# Patient Record
Sex: Male | Born: 1940 | Race: Asian | Hispanic: No | Smoking: Never smoker
Health system: Southern US, Community
[De-identification: ages and names within clinical notes are randomized; demographics above are authoritative.]

## PROBLEM LIST (undated history)

## (undated) DIAGNOSIS — I639 Cerebral infarction, unspecified: Secondary | ICD-10-CM

## (undated) DIAGNOSIS — R42 Dizziness and giddiness: Secondary | ICD-10-CM

## (undated) DIAGNOSIS — E119 Type 2 diabetes mellitus without complications: Secondary | ICD-10-CM

## (undated) DIAGNOSIS — I10 Essential (primary) hypertension: Secondary | ICD-10-CM

## (undated) HISTORY — DX: Cerebral infarction, unspecified: I63.9

---

## 2010-04-20 ENCOUNTER — Inpatient Hospital Stay (HOSPITAL_COMMUNITY): Admission: EM | Admit: 2010-04-20 | Discharge: 2010-04-23 | Payer: Self-pay | Admitting: Emergency Medicine

## 2010-04-20 ENCOUNTER — Emergency Department (HOSPITAL_COMMUNITY): Admission: EM | Admit: 2010-04-20 | Discharge: 2010-04-20 | Payer: Self-pay | Admitting: Emergency Medicine

## 2010-04-20 ENCOUNTER — Encounter (INDEPENDENT_AMBULATORY_CARE_PROVIDER_SITE_OTHER): Payer: Self-pay | Admitting: *Deleted

## 2010-04-20 DIAGNOSIS — D72829 Elevated white blood cell count, unspecified: Secondary | ICD-10-CM | POA: Insufficient documentation

## 2010-04-20 DIAGNOSIS — Z8719 Personal history of other diseases of the digestive system: Secondary | ICD-10-CM

## 2010-04-21 ENCOUNTER — Ambulatory Visit: Payer: Self-pay | Admitting: Internal Medicine

## 2010-04-21 ENCOUNTER — Encounter (INDEPENDENT_AMBULATORY_CARE_PROVIDER_SITE_OTHER): Payer: Self-pay | Admitting: *Deleted

## 2010-05-02 ENCOUNTER — Ambulatory Visit: Payer: Self-pay | Admitting: Nurse Practitioner

## 2010-05-02 DIAGNOSIS — I10 Essential (primary) hypertension: Secondary | ICD-10-CM

## 2010-05-10 ENCOUNTER — Encounter (INDEPENDENT_AMBULATORY_CARE_PROVIDER_SITE_OTHER): Payer: Self-pay | Admitting: *Deleted

## 2010-05-14 ENCOUNTER — Emergency Department (HOSPITAL_COMMUNITY): Admission: EM | Admit: 2010-05-14 | Discharge: 2010-05-14 | Payer: Self-pay | Admitting: Emergency Medicine

## 2010-05-16 ENCOUNTER — Ambulatory Visit: Payer: Self-pay | Admitting: Nurse Practitioner

## 2010-05-16 LAB — CONVERTED CEMR LAB
BUN: 6 mg/dL (ref 6–23)
CO2: 24 meq/L (ref 19–32)
Calcium: 9.5 mg/dL (ref 8.4–10.5)
Chloride: 104 meq/L (ref 96–112)
Glucose, Bld: 92 mg/dL (ref 70–99)
Sodium: 140 meq/L (ref 135–145)

## 2010-06-27 ENCOUNTER — Ambulatory Visit: Payer: Self-pay | Admitting: Nurse Practitioner

## 2010-06-29 ENCOUNTER — Ambulatory Visit: Payer: Self-pay | Admitting: Internal Medicine

## 2010-06-29 DIAGNOSIS — K5289 Other specified noninfective gastroenteritis and colitis: Secondary | ICD-10-CM | POA: Insufficient documentation

## 2010-06-29 DIAGNOSIS — R1031 Right lower quadrant pain: Secondary | ICD-10-CM | POA: Insufficient documentation

## 2010-06-29 DIAGNOSIS — R933 Abnormal findings on diagnostic imaging of other parts of digestive tract: Secondary | ICD-10-CM | POA: Insufficient documentation

## 2010-08-28 ENCOUNTER — Telehealth (INDEPENDENT_AMBULATORY_CARE_PROVIDER_SITE_OTHER): Payer: Self-pay | Admitting: Nurse Practitioner

## 2010-08-28 ENCOUNTER — Ambulatory Visit: Payer: Self-pay | Admitting: Nurse Practitioner

## 2010-08-28 DIAGNOSIS — N433 Hydrocele, unspecified: Secondary | ICD-10-CM

## 2010-08-28 DIAGNOSIS — R351 Nocturia: Secondary | ICD-10-CM

## 2010-08-28 LAB — CONVERTED CEMR LAB
Alkaline Phosphatase: 62 units/L (ref 39–117)
Basophils Absolute: 0 10*3/uL (ref 0.0–0.1)
Bilirubin Urine: NEGATIVE
Blood in Urine, dipstick: NEGATIVE
CO2: 28 meq/L (ref 19–32)
Calcium: 9.2 mg/dL (ref 8.4–10.5)
Eosinophils Absolute: 0.1 10*3/uL (ref 0.0–0.7)
Glucose, Urine, Semiquant: NEGATIVE
HCT: 44.2 % (ref 39.0–52.0)
Ketones, urine, test strip: NEGATIVE
Lymphs Abs: 1.6 10*3/uL (ref 0.7–4.0)
MCHC: 35.1 g/dL (ref 30.0–36.0)
Microalb, Ur: 0.5 mg/dL (ref 0.00–1.89)
Nitrite: NEGATIVE
Potassium: 3.9 meq/L (ref 3.5–5.3)
RBC: 4.86 M/uL (ref 4.22–5.81)
RDW: 13.9 % (ref 11.5–15.5)
Sodium: 140 meq/L (ref 135–145)
Specific Gravity, Urine: 1.015
WBC Urine, dipstick: NEGATIVE

## 2010-08-29 ENCOUNTER — Encounter (INDEPENDENT_AMBULATORY_CARE_PROVIDER_SITE_OTHER): Payer: Self-pay | Admitting: Nurse Practitioner

## 2010-08-30 ENCOUNTER — Encounter (INDEPENDENT_AMBULATORY_CARE_PROVIDER_SITE_OTHER): Payer: Self-pay | Admitting: *Deleted

## 2010-09-05 ENCOUNTER — Encounter (INDEPENDENT_AMBULATORY_CARE_PROVIDER_SITE_OTHER): Payer: Self-pay | Admitting: Nurse Practitioner

## 2010-09-05 ENCOUNTER — Ambulatory Visit: Payer: Self-pay | Admitting: Nurse Practitioner

## 2010-09-05 LAB — CONVERTED CEMR LAB
Cholesterol: 200 mg/dL (ref 0–200)
HDL: 51 mg/dL (ref >39)
LDL Cholesterol: 125 mg/dL — ABNORMAL HIGH (ref 0–99)
Total CHOL/HDL Ratio: 3.9
Triglycerides: 119 mg/dL (ref <150)
VLDL: 24 mg/dL (ref 0–40)

## 2010-09-06 ENCOUNTER — Encounter (INDEPENDENT_AMBULATORY_CARE_PROVIDER_SITE_OTHER): Payer: Self-pay | Admitting: Nurse Practitioner

## 2010-09-19 ENCOUNTER — Ambulatory Visit: Payer: Self-pay | Admitting: Internal Medicine

## 2010-09-19 ENCOUNTER — Encounter (INDEPENDENT_AMBULATORY_CARE_PROVIDER_SITE_OTHER): Payer: Self-pay

## 2010-10-02 ENCOUNTER — Encounter: Payer: Self-pay | Admitting: Internal Medicine

## 2010-10-02 ENCOUNTER — Ambulatory Visit
Admission: RE | Admit: 2010-10-02 | Discharge: 2010-10-02 | Payer: Self-pay | Source: Home / Self Care | Attending: Internal Medicine | Admitting: Internal Medicine

## 2010-10-06 ENCOUNTER — Encounter: Payer: Self-pay | Admitting: Internal Medicine

## 2010-10-24 NOTE — Assessment & Plan Note (Signed)
Summary: Recheck BP, BMET   Nurse Visit   Vital Signs:  Patient profile:   70 year old male Pulse rate:   72 / minute Pulse rhythm:   regular BP sitting:   138 / 82  (right arm)  Patient Instructions: 1)  Blood pressure is better, almost at goal blood pressure of less than 135/80. 2)  Per Jesse Fall, continue taking current dose of Lisinopril, no change at this time. 3)  Follow-up with Jesse Fall at next scheduled appt. 4)  Call if anything changes before then.   CC:  BP check and BMET.  History of Present Illness: Here for BP check and BMET.  Initial visit for hospital f/u on 05/02/10.  Has taken meds this morning.  Started Lisinopril at last visit, denies any side effects. BP goal is < 135/80.   Physical Exam  General:  alert, well-developed, and well-hydrated.     Review of Systems  The patient denies chest pain, syncope, peripheral edema, prolonged cough, and headaches.         Also denies any visual changes or dizziness.  CC: BP check and BMET  Does patient need assistance? Functional Status Self care Ambulation Normal   Allergies: No Known Drug Allergies  Orders Added: 1)  Est. Patient Level I [04540]  Appended Document: Orders Update    Clinical Lists Changes  Orders: Added new Test order of T-Basic Metabolic Panel [id number]) - Signed

## 2010-10-24 NOTE — Assessment & Plan Note (Signed)
Summary: Complete Physical Exam   Vital Signs:  Patient profile:   70 year old male Weight:      155.9 pounds BMI:     24.51 Temp:     97.2 degrees F oral Pulse rate:   70 / minute Pulse rhythm:   regular Resp:     16 per minute BP sitting:   130 / 90  (left arm)  Vitals Entered By: Levon Hedger (August 28, 2010 10:02 AM) CC: CPE, Hypertension Management Is Patient Diabetic? No Pain Assessment Patient in pain? no       Does patient need assistance? Functional Status Self care Ambulation Normal   Primary Care Provider:  Maralyn Sago, FNP  CC:  CPE and Hypertension Management.  History of Present Illness:  Pt into the office for a complete physical exam Presents today with Nephew who is his interpreter [employer]  Colonscopy - discussed with pt by GI about colonscopy Pt was to review the information and decide if he wants to have it done Pt and nephew have discussed and decided to have the screening test.  Dental - 1 month ago was last dental appt for routine cleaning  Optho - No eye exam  Hypertension History:      He denies headache, chest pain, and palpitations.  He notes no problems with any antihypertensive medication side effects.  Pt presents today with blood pressure medications which he takes daily.        Positive major cardiovascular risk factors include male age 69 years old or older and hypertension.  Negative major cardiovascular risk factors include non-tobacco-user status.     Habits & Providers  Alcohol-Tobacco-Diet     Alcohol drinks/day: 2     Alcohol Counseling: to decrease amount and/or frequency of alcohol intake     Alcohol type: beer     Tobacco Status: quit     Tobacco Counseling: to remain off tobacco products     Year Quit: quit 02/2010  Exercise-Depression-Behavior     Does Patient Exercise: no     Have you felt down or hopeless? no     Have you felt little pleasure in things? no     Depression Counseling: not  indicated; screening negative for depression     Drug Use: never  Comments: PHQ- 9 not done due to language barrier  Medications Prior to Update: 1)  Lisinopril 20 Mg Tabs (Lisinopril) .... One Tablet By Mouth Daily For Blood Pressure **note Change in Dose**  Current Medications (verified): 1)  Lisinopril 20 Mg Tabs (Lisinopril) .... One Tablet By Mouth Daily For Blood Pressure **note Change in Dose**  Allergies (verified): No Known Drug Allergies  Review of Systems General:  Denies fever. Eyes:  Denies blurring. ENT:  Denies earache. CV:  Denies chest pain or discomfort. Resp:  Denies cough. GI:  Denies abdominal pain, constipation, nausea, and vomiting; Daily BM in morning, no straining. GU:  Denies urinary frequency. MS:  Denies joint pain; +dizziness at times. usually short lived and is positional. Derm:  Denies dryness. Neuro:  Denies headaches. Psych:  Denies depression.  Physical Exam  General:  alert.   Head:  normocephalic and male-pattern balding.   Eyes:  pupils equal, pupils round, and pupils reactive to light.   Ears:  bil TM with bony landmarks present Nose:  no nasal discharge.   Mouth:  pharynx pink and moist and poor dentition.   Neck:  supple.   Chest Wall:  no deformities.  Breasts:  no gynecomastia.   Lungs:  normal breath sounds.   Heart:  normal rate and regular rhythm.   Abdomen:  soft, non-tender, and normal bowel sounds.   Rectal:  no external abnormalities and external hemorrhoid(s).   Genitalia:  uncircumcised.  right testicular enlargment Prostate:  1+ enlarged.  nontender Msk:  normal ROM.   Extremities:  no edema Neurologic:  alert & oriented X3.   Skin:  color normal.   Psych:  Oriented X3.     Impression & Recommendations:  Problem # 1:  HYPERTENSION, BENIGN ESSENTIAL (ICD-401.1) BP stable continue current meds His updated medication list for this problem includes:    Lisinopril 20 Mg Tabs (Lisinopril) ..... One tablet by  mouth daily for blood pressure **note change in dose**  Orders: EKG w/ Interpretation (93000) T-CBC w/Diff (16109-60454) T-Comprehensive Metabolic Panel (09811-91478) T-Urine Microalbumin w/creat. ratio [phone])  Problem # 2:  NOCTURIA (ICD-788.43) u/a ok today. 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 One tablet by mouth daily for blood pressure **note change in dose**  Hypertension Assessment/Plan:      The patient's hypertensive risk group is category B: At least one risk factor (excluding diabetes) with no target organ damage.  Today's blood pressure is 130/90.  His blood pressure goal is < 140/90.  Patient Instructions: 1)  Schedule an appointment for fasting labs - lipids (401.1) 2)  You will be schedule for colonscopy. You will be notified the time/date of the appointment. 3)  Testicular - right side enlargment.  Since this has been with you since childhood then will schedule ultrasound at next visit 4)  Follow up in 2 months with n.martin,fnp for high blood pressure   Orders Added: 1)  Colonoscopy [Colon] 2)  Est. Patient Level IV [10272] 3)  Hemoccult Guaiac-1 spec.(in office) [82270] 4)  UA Dipstick w/o Micro (manual) [81002] 5)  EKG w/ Interpretation [93000] 6)  T-CBC w/Diff  [53664-40347] 7)  T-Comprehensive Metabolic Panel [80053-22900] 8)  T-PSA [42595-63875] 9)  T-Urine Microalbumin w/creat. ratio [[id number]]    Laboratory Results   Urine Tests  Date/Time Received: August 28, 2010 10:27 AM   Routine Urinalysis   Color: lt. yellow Appearance: Clear Glucose: negative   (Normal Range: Negative) Bilirubin: negative   (Normal Range: Negative) Ketone: negative   (Normal Range: Negative) Spec. Gravity: 1.015   (Normal Range: 1.003-1.035) Blood: negative   (Normal Range: Negative) pH: 7.0   (Normal Range: 5.0-8.0) Protein: negative   (Normal Range: Negative) Urobilinogen: 0.2   (Normal Range: 0-1) Nitrite: negative   (Normal Range: Negative) Leukocyte Esterace: negative   (Normal Range: Negative)        Prevention & Chronic Care Immunizations   Influenza vaccine: Fluvax 3+  (06/27/2010)    Tetanus booster: 12/06/2009: Historical    Pneumococcal vaccine: Historical  (05/10/2010)    H. zoster  vaccine: Not documented  Colorectal Screening   Hemoccult: Not documented   Hemoccult action/deferral: Ordered  (08/28/2010)   Hemoccult due: 08/29/2011    Colonoscopy: Not documented   Colonoscopy action/deferral: GI referral  (08/28/2010)  Other Screening   PSA: Not documented   PSA ordered.   PSA action/deferral: Discussed-PSA requested  (08/28/2010)   PSA due due: 08/29/2011   Smoking status: quit  (08/28/2010)  Lipids   Total Cholesterol: Not documented   LDL: Not documented   LDL Direct: Not documented   HDL: Not documented   Triglycerides: Not documented  Hypertension   Last Blood Pressure: 130 / 90  (08/28/2010)   Serum creatinine: 0.73  (05/16/2010)   Serum potassium 4.6  (05/16/2010) CMP ordered   Self-Management Support :    Hypertension self-management support: Not documented    EKG  Procedure date:  08/28/2010  Findings:      NSR  Appended Document: Complete Physical Exam    Clinical Lists  Changes  Observations: Added new observation of HEMOCCU 1 DT: 08/28/2010 (08/28/2010 11:13) Added new observation of HEMOCCULT 1: negative (08/28/2010 11:13)        Laboratory Results  Date/Time Received: August 28, 2010 11:14 AM   Stool - Occult Blood Hemmoccult #1: negative Date: 08/28/2010

## 2010-10-24 NOTE — Assessment & Plan Note (Signed)
Summary: NEW - Hospital F/U   Vital Signs:  Patient profile:   70 year old male Height:      67 inches Weight:      144.9 pounds BMI:     22.78 BSA:     1.76 Temp:     97.8 degrees F oral Pulse rate:   79 / minute Pulse rhythm:   regular Resp:     20 per minute BP sitting:   148 / 88  (left arm) Cuff size:   regular  Vitals Entered By: Levon Hedger (May 02, 2010 9:55 AM) CC: follow-up visit hospital visit [place]...Marland Kitchenneeds medication for HTN, Hypertension Management Is Patient Diabetic? No Pain Assessment Patient in pain? no       Does patient need assistance? Functional Status Self care Ambulation Normal   CC:  follow-up visit hospital visit [place]...Marland Kitchenneeds medication for HTN and Hypertension Management.  History of Present Illness:  Pt into the office to establish care.  No previous PCP  Pt recently relocated from [place] 2 months ago.  Hospitalized from 04/20/2010 to 04/23/2010 (full hospital d/c reviewed)  1. Colitis - presumed infectious.  General surgery consulted due to ? appendicitis.  Pt was started on IV antibiotics cipro and metronidazole.  Surgery recommended to monitor and consider consulting GI because infection appeared to involve the appendix - extension into the cecal colitis. Stool cultures negative apart from fecal lactoferrin, which is positive.   C. Diff negative. Plan:  continue antibioitice for total of 7 days and f/u with GI as outpt for colonscopy in 4 weeks - Dr. Marina Goodell.   2.  HTN - questionable dx.  Pt was taken BP in his country prior to moving to the [place].  He completed his supply so was not able to continue meds.  3.  Leukocytosis - probably part of infectious colitis.  WBC 21.5 on admission and decreased to 6.8 after initiation of antibiotics.    presents today with nephew who is interpreting for pt  Hypertension History:      previous Dx but no meds at this time.        Positive major cardiovascular risk factors include male  age 52 years old or older and hypertension.  Negative major cardiovascular risk factors include non-tobacco-user status.     Habits & Providers  Alcohol-Tobacco-Diet     Alcohol drinks/day: 0     Alcohol Counseling: quit 02/2010     Tobacco Status: quit     Tobacco Counseling: to remain off tobacco products     Year Quit: quit 02/2010  Exercise-Depression-Behavior     Drug Use: never  Medications Prior to Update: 1)  None  Allergies (verified): No Known Drug Allergies  Social History: Orginally from [place] To [place] in 02/2010 Tobacco - quit 02/2010 ETOH - quit 02/2010 Drug - noneSmoking Status:  quit Drug Use:  never  Review of Systems CV:  Denies chest pain or discomfort. Resp:  Denies cough. GI:  Denies abdominal pain, diarrhea, nausea, and vomiting.  Physical Exam  General:  alert.   Head:  normocephalic.   Lungs:  normal breath sounds.   Heart:  normal rate and regular rhythm.   Abdomen:  normal bowel sounds.   Msk:  normal ROM.   Neurologic:  alert & oriented X3.   Psych:  Oriented X3.     Impression & Recommendations:  Problem # 1:  HYPERTENSION, BENIGN ESSENTIAL (ICD-401.1) will start on low dose ACE His updated medication list  for this problem includes:    Lisinopril 10 Mg Tabs (Lisinopril) ..... One tablet by mouth daily for blood pressure  Problem # 2:  Hx of LEUKOCYTOSIS (ICD-288.60) resolved prior to hospital d/c  Problem # 3:  COLITIS, HX OF (ICD-V12.79) pt to schedule f/u appt with GI he has finished antibiotics as ordered  Complete Medication List: 1)  Lisinopril 10 Mg Tabs (Lisinopril) .... One tablet by mouth daily for blood pressure  Hypertension Assessment/Plan:      The patient's hypertensive risk group is category B: At least one risk factor (excluding diabetes) with no target organ damage.  Today's blood pressure is 148/88.  His blood pressure goal is < 140/90.  Patient Instructions: 1)  Call [facility] and schedule an appointment with  Dr. Marina Goodell. 2)  Blood pressure - slightly elevated today.  Will start lisinopril 10mg  by mouth daily for blood pressure 3)  Follow up in 2 weeks with Triage Nurse for blood pressure check.  Goal < 135/80 4)  Take blood pressure medications before this appointment. 5)  will also need bmet 6)  Follow up with n.martin,fnp in 8 weeks for blood pressure and stomach. Prescriptions: LISINOPRIL 10 MG TABS (LISINOPRIL) One tablet by mouth daily for blood pressure  #30 x 1   Entered and Authorized by:   Lehman Prom FNP   Signed by:   Lehman Prom FNP on 05/02/2010   Method used:   Print then Give to Patient   RxID:   [id number]    CT of Abdomen  Procedure date:  04/20/2010  Findings:      significant cecal wall thckening, most likely infectious or inflammatory colitis.  there is also thickened enlargment of the appendix, slight colitis.  There is also thickened enlargment of the appendis, slight stranding noting around both the cecum and appartently the appendix.  Finding concerning for associated appendicitis   CT of Abdomen  Procedure date:  04/20/2010  Findings:      significant cecal wall thckening, most likely infectious or inflammatory colitis.  there is also thickened enlargment of the appendix, slight colitis.  There is also thickened enlargment of the appendis, slight stranding noting around both the cecum and appartently the appendix.  Finding concerning for associated appendicitis

## 2010-10-24 NOTE — Letter (Signed)
Summary: *HSN Results Follow up  [facility]  [address] [place], [place] [address]   Phone: [phone]  Fax: [phone]      08/29/2010   [name] [address] [place], [place]  [address]   Dear  [name],                            ____S.Drinkard,FNP   ____D. Gore,FNP       ____B. McPherson,MD   ____V. Rankins,MD    ____E. Mulberry,MD    __X__N. Daphine Deutscher, FNP  ____D. Reche Dixon, MD    ____K. Philipp Deputy, MD    ____Other     This letter is to inform you that your recent test(s):  _______Pap Smear    ___X____Lab Test     _______X-ray    ___X____ is within acceptable limits  _______ requires a medication change  _______ requires a follow-up lab visit  _______ requires a follow-up visit with your provider   Comments: Labs done during recent office visit are normal.       _________________________________________________________ If you have any questions, please contact our office [phone].                    Sincerely,    Lehman Prom FNP [facility]

## 2010-10-24 NOTE — Assessment & Plan Note (Signed)
Summary: Hospital follow (new patient)   History of Present Illness Visit Type: Initial Consult Primary GI MD: Yancey Flemings MD Primary Mailani Degroote: Maralyn Sago, FNP Chief Complaint: Patient c/o RLQ abdominal pain which occurred for several days. It has since subsided. No other GI symptoms. History of Present Illness:   Pleasant non-English-speaking 70 year old native of [place] who presents today for post hospitalization follow up. He is accompanied by his son, who provides translation. The patient was seen as a hospital inpatient after admission on April 20, 2010 for acute right lower quadrant abdominal pain. Upon presentation, there was leukocytosis. CT Scan of the abdomen and pelvis revealed thickening and inflammation in the region of the cecum. Some initial concern for appendicitis for which he was seen by surgery. Subsequently felt to have an acute infectious colitis, unspecified. He was discharged home within 3 days and at bedtime feeling well. Since his discharge home he has had no problems. No recurrent abdominal pain. No problems with his bowels. No bleeding. Good appetite. No weight loss. He is seen at [facility].   GI Review of Systems     Location of  Abdominal pain: RLQ.    Denies abdominal pain, acid reflux, belching, bloating, chest pain, dysphagia with liquids, dysphagia with solids, heartburn, loss of appetite, nausea, vomiting, vomiting blood, weight loss, and  weight gain.         Current Medications (verified): 1)  Lisinopril 20 Mg Tabs (Lisinopril) .... One Tablet By Mouth Daily For Blood Pressure **note Change in Dose**  Allergies (verified): No Known Drug Allergies  Past History:  Past Medical History: Reviewed history from 06/28/2010 and no changes required. Colitis Hypertension  Past Surgical History: Reviewed history from 06/28/2010 and no changes required. Unremarkable  Family History: No FH of Colon Cancer:  Social History: Reviewed history  from 05/02/2010 and no changes required. Orginally from [place] To [place] in 02/2010 Tobacco - quit 02/2010 ETOH - quit 02/2010 Drug - none  Review of Systems       The patient complains of headaches-new.  The patient denies allergy/sinus, anemia, anxiety-new, arthritis/joint pain, back pain, blood in urine, breast changes/lumps, change in vision, confusion, cough, coughing up blood, depression-new, fainting, fatigue, fever, hearing problems, heart murmur, heart rhythm changes, itching, menstrual pain, muscle pains/cramps, night sweats, nosebleeds, pregnancy symptoms, shortness of breath, skin rash, sleeping problems, sore throat, swelling of feet/legs, swollen lymph glands, thirst - excessive , urination - excessive , urination changes/pain, urine leakage, vision changes, and voice change.    Vital Signs:  Patient profile:   70 year old male Height:      67 inches Weight:      151 pounds BMI:     23.74 BSA:     1.80 Pulse rate:   88 / minute Pulse rhythm:   regular BP sitting:   154 / 98  (left arm)  Vitals Entered By: Lamona Curl CMA Duncan Dull) (June 29, 2010 10:26 AM)  Physical Exam  General:  Well developed, well nourished, no acute distress. Head:  Normocephalic and atraumatic. Eyes:  PERRLA, no icterus. Mouth:  No deformity or lesions, Neck:  Supple; no masses or thyromegaly. Lungs:  Clear throughout to auscultation. Heart:  Regular rate and rhythm; no murmurs, rubs,  or bruits. Abdomen:  Soft, nontender and nondistended. No masses, hepatosplenomegaly or hernias noted. Normal bowel sounds. Msk:  no deformities Pulses:  Normal pulses noted. Extremities:  no edema Neurologic:  alert oriented Skin:  no rash Psych:  Alert  and cooperative. Normal mood and affect.   Impression & Recommendations:  Problem # 1:  OTH&UNSPEC NONINFECTIOUS GASTROENTERITIS&COLITIS (ICD-64.82) 70 year old who presented to the hospital 2 months ago with acute right lower quadrant pain and a CT scan  demonstrating inflammation of the proximal right colon. Clinical picture most consistent with acute colitis, likely infectious. Prompt resolution with supportive care. No recurrence. Currently asymptomatic. I discussed this with the patient, through son, and his son. At this point, expectant management for this condition. We also discussed the prospect of colonoscopy for colon cancer screening. See below  Problem # 2:  SCREENING COLORECTAL-CANCER (ICD-V76.51) appropriate candidate without contraindication. Next the procedure as well as risks benefits and alternatives were discussed with the patient's son and the patient through the son. Not clear if they want to proceed. Thus, literature on colon polyps, colon cancer, and colonoscopy [place] Kynard for their review. We would be happy to set up outpatient screening colonoscopy if desired. Otherwise, but will return to the care of her primary Mushka Laconte.  Problem # 3:  ABDOMINAL PAIN-RLQ (ICD-789.03) please see  #1 above  Problem # 4:  ABNORMAL FINDINGS GI TRACT (ICD-793.4) please see #1 above  Patient Instructions: 1)  Pt. to read colonoscopy and colon polyps/cancer brochure and decide if wants to schedule.   2)  Please schedule a follow-up appointment as needed.  3)  Copy sent to : Maralyn Sago, FNP 4)  The medication list was reviewed and reconciled.  All changed / newly prescribed medications were explained.  A complete medication list was provided to the patient / caregiver.

## 2010-10-24 NOTE — Letter (Signed)
Summary: New Patient letter  [facility]  [address] [place], [place] [address]   Phone: [phone]  Fax: [phone]       05/10/2010 MRN: [id number]  [facility] [address] [place], [place]  [address]  Dear [name],  Welcome to the Gastroenterology Division at [facility].    You are scheduled to see Dr.  Marina Goodell on 06-29-10 at 10:00a.m. on the 3rd floor at [facility], [address]  We ask that you try to arrive at our office 15 minutes prior to your appointment time to allow for check-in.  We would like you to complete the enclosed self-administered evaluation form prior to your visit and bring it with you on the day of your appointment.  We will review it with you.  Also, please bring a complete list of all your medications or, if you prefer, bring the medication bottles and we will list them.  Please bring your insurance card so that we may make a copy of it.  If your insurance requires a referral to see a specialist, please bring your referral form from your primary care physician.  Co-payments are due at the time of your visit and may be paid by cash, check or credit card.     Your office visit will consist of a consult with your physician (includes a physical exam), any laboratory testing he/she may order, scheduling of any necessary diagnostic testing (e.g. x-ray, ultrasound, CT-scan), and scheduling of a procedure (e.g. Endoscopy, Colonoscopy) if required.  Please allow enough time on your schedule to allow for any/all of these possibilities.    If you cannot keep your appointment, please call [phone] to cancel or reschedule prior to your appointment date.  This allows [place] the opportunity to schedule an appointment for another patient in need of care.  If you do not cancel or reschedule by 5 p.m. the business day prior to your appointment date, you will be charged a $50.00 late cancellation/no-show fee.    Thank you for choosing  [facility] for your medical needs.  We appreciate the opportunity to care for you.  Please visit [place] at our website  to learn more about our practice.                     Sincerely,                                                             The Gastroenterology Division

## 2010-10-24 NOTE — Assessment & Plan Note (Signed)
Summary: HTN   Vital Signs:  Patient profile:   70 year old male Weight:      150.4 pounds Temp:     97.1 degrees F oral Pulse rate:   80 / minute Pulse rhythm:   regular Resp:     10 per minute BP sitting:   140 / 90  (left arm) Cuff size:   regular  Vitals Entered By: Levon Hedger (June 27, 2010 9:06 AM) CC: follow-up visit, Hypertension Management Is Patient Diabetic? No Pain Assessment Patient in pain? no       Does patient need assistance? Functional Status Self care Ambulation Normal   CC:  follow-up visit and Hypertension Management.  History of Present Illness:  Pt into the office for f/u on htn. Pt has last seen in this office 2 months ago.   Colitis - pt has f/u appt with [facility] on October 6th. No current complaints with stomach Pt took all antibiotics as ordered -diarrhea -abd pain -fever  Nephew here today to interpret for pt.  No acute problems today, only here for scheduled f/u appt.  Hypertension History:      He denies headache, chest pain, and palpitations.  He notes no problems with any antihypertensive medication side effects.  Pt did not bring his medications into the office but has a print out of his last office visit and acknowledges that he takes the medications daily.  .  Further comments include: He takes medications at 8AM daily.        Positive major cardiovascular risk factors include male age 42 years old or older and hypertension.  Negative major cardiovascular risk factors include non-tobacco-user status.     Habits & Providers  Alcohol-Tobacco-Diet     Alcohol drinks/day: 0     Alcohol Counseling: quit 02/2010     Tobacco Status: quit     Tobacco Counseling: to remain off tobacco products     Year Quit: quit 02/2010  Exercise-Depression-Behavior     Does Patient Exercise: no     Have you felt down or hopeless? no     Have you felt little pleasure in things? no     Depression Counseling: not indicated; screening  negative for depression     Drug Use: never  Current Medications (verified): 1)  Lisinopril 20 Mg Tabs (Lisinopril) .... One Tablet By Mouth Daily For Blood Pressure **note Change in Dose**  Allergies (verified): No Known Drug Allergies  Social History: Does Patient Exercise:  no  Review of Systems General:  Denies fever. CV:  Denies chest pain or discomfort. Resp:  Denies cough. GI:  Denies abdominal pain, diarrhea, nausea, and vomiting.  Physical Exam  General:  alert.   Head:  normocephalic.   Lungs:  normal breath sounds.   Heart:  normal rate and regular rhythm.   Abdomen:  normal bowel sounds.   Msk:  normal ROM.   Neurologic:  alert & oriented X3.   Skin:  color normal.   Psych:  Oriented X3.     Impression & Recommendations:  Problem # 1:  HYPERTENSION, BENIGN ESSENTIAL (ICD-401.1) still elevated will increase meds  DASH diet reviewed His updated medication list for this problem includes:    Lisinopril 20 Mg Tabs (Lisinopril) ..... One tablet by mouth daily for blood pressure **note change in dose**  Problem # 2:  NEED PROPHYLACTIC VACCINATION&INOCULATION FLU (ICD-V04.81) flu vaccine given today  Complete Medication List: 1)  Lisinopril 20 Mg Tabs (Lisinopril) .... One  tablet by mouth daily for blood pressure **note change in dose**  Other Orders: Flu Vaccine 44yrs + (52841) Admin 1st Vaccine (32440)  Hypertension Assessment/Plan:      The patient's hypertensive risk group is category B: At least one risk factor (excluding diabetes) with no target organ damage.  Today's blood pressure is 140/90.  His blood pressure goal is < 140/90.  Patient Instructions: 1)  Blood pressure - still a little elevated 2)  will increase lisinopril to 20mg  by mouth daily 3)  You have been given the flu vaccine today. 4)  Keep your follow up appointment with [facility] this week 5)  Schedule appointment for a complete physical exam here in 2 months with n.martin,fnp. 6)   Do not eat after midnight before this appointment but take your blood pressure medications with water. 7)  Will need lipids, cbc, psa, cmp, u/a, microalbumin.  Will also need rectal and prostate exam, EKG. Prescriptions: LISINOPRIL 20 MG TABS (LISINOPRIL) One tablet by mouth daily for blood pressure **note change in dose**  #30 x 5   Entered and Authorized by:   Lehman Prom FNP   Signed by:   Lehman Prom FNP on 06/27/2010   Method used:   Print then Give to Patient   RxID:   [id number]   Immunization History:  Tetanus/Td Immunization History:    Tetanus/Td:  historical (12/06/2009)  Pneumovax Immunization History:    Pneumovax:  historical (05/10/2010)  Immunizations Administered:  Influenza Vaccine # 1:    Vaccine Type: Fluvax 3+    Site: left deltoid    Mfr: GlaxoSmithKline    Dose: 0.5 ml    Route: IM    Given by: Levon Hedger    Exp. Date: 02/2011    Lot #: [id number]    VIS given: 04/18/10 version given June 27, 2010.  Flu Vaccine Consent Questions:    Do you have a history of severe allergic reactions to this vaccine? no    Any prior history of allergic reactions to egg and/or gelatin? no    Do you have a sensitivity to the preservative Thimersol? no    Do you have a past history of Guillan-Barre Syndrome? no    Do you currently have an acute febrile illness? no    Have you ever had a severe reaction to latex? no    Vaccine information given and explained to patient? yes ndc [phone]  Prevention & Chronic Care Immunizations   Influenza vaccine: Fluvax 3+  (06/27/2010)    Tetanus booster: 12/06/2009: Historical    Pneumococcal vaccine: Historical  (05/10/2010)    H. zoster vaccine: Not documented  Colorectal Screening   Hemoccult: Not documented    Colonoscopy: Not documented  Other Screening   PSA: Not documented   Smoking status: quit  (06/27/2010)  Lipids   Total Cholesterol: Not documented   LDL: Not documented   LDL  Direct: Not documented   HDL: Not documented   Triglycerides: Not documented  Hypertension   Last Blood Pressure: 140 / 90  (06/27/2010)   Serum creatinine: 0.73  (05/16/2010)   Serum potassium 4.6  (05/16/2010)  Self-Management Support :    Hypertension self-management support: Not documented   Nursing Instructions: Give Flu vaccine today

## 2010-10-24 NOTE — Initial Assessments (Signed)
Summary: Consultation    [name], [name]                ACCOUNT NO.:  [id number]      MEDICAL RECORD NO.:  [id number]          PATIENT TYPE:  INP      LOCATION:  5036                         FACILITY:  [facility]      PHYSICIAN:  Gabrielle Dare. Janee Morn, M.D.DATE OF BIRTH:  10-08-1940      DATE OF CONSULTATION:  04/20/2010   DATE OF DISCHARGE:                                    CONSULTATION      REASON FOR CONSULTATION:  Cecal colitis.      HISTORY OF PRESENT ILLNESS:  [name] is a 70 year old gentleman from   [place] who recently moved to this area who developed right-sided   abdominal pain around 4 a.m. this morning.  He did not have associated   nausea and vomiting.  He has had no blood in his stools or any recent   change in stools.  He was evaluated in the emergency department.  He was   found to have an elevated white blood cell count, and CT scan of the   abdomen and pelvis shows inflammation of the cecum and some secondary   inflammation of the appendix.  This was consistent with cecal colitis.   He was admitted to the Medical Service.      PAST MEDICAL HISTORY:  Hypertension.      PAST SURGICAL HISTORY:  None.      SOCIAL HISTORY:  He smokes cigarettes.  He does not drink alcohol.      ALLERGIES:  No known drug allergies.      MEDICATIONS:  Reviewed and these include Cipro and Flagyl IV.      REVIEW OF SYSTEMS:  Unable to do due to language barrier.      PHYSICAL EXAMINATION:  VITAL SIGNS:  Temperature 98.1, blood pressure   131/86, heart rate 81, and respirations 18.   GENERAL:  He is awake.   LUNGS:  Clear to auscultation.   HEART:  Regular with no murmurs.   ABDOMEN:  Tenderness on the right lateral abdomen and right lower   quadrant with occasional guarding.  There is no generalized tenderness   and no peritonitis.  Bowel sounds are present.   EXTREMITIES:  Warm with palpable DPs bilaterally.      LABORATORY STUDIES:  Lactate 2.7.  Lipase 36.  Urinalysis  negative.   Basic metabolic panel unremarkable with the exception of glucose of 130,   AST 52, ALT 34, alkaline phosphatase 85, and bilirubin 1.5.  White blood   cell count 21.5 and hemoglobin 17.3.      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      RECOMMENDATIONS:  I agree with medical management with bowel rest and   intravenous Cipro and Flagyl.  We will follow along closely with you and   the plan was discussed with the patient's daughter who served as   interpreter.               Gabrielle Dare Janee Morn, M.D.  BET/MEDQ  D:  04/20/2010  T:  04/21/2010  Job:  [id number]      Electronically Signed by Violeta Gelinas M.D. on 04/26/2010 04:14:20 PM

## 2010-10-24 NOTE — Letter (Signed)
Summary: *HSN Results Follow up  [facility]  [address] [place], [place] [address]   Phone: [phone]  Fax: [phone]      08/30/2010   [name] [address] [place], [place]  [address]   Dear  [name],                            ____S.Drinkard,FNP   ____D. Gore,FNP       ____B. McPherson,MD   ____V. Rankins,MD    ____E. Mulberry,MD    _X___N. Daphine Deutscher, FNP  ____D. Reche Dixon, MD    ____K. Philipp Deputy, MD    ____Other     This letter is to inform you that your recent test(s):  _______Pap Smear    _______Lab Test     _______X-ray    _______ is within acceptable limits  _______ requires a medication change  _______ requires a follow-up lab visit  _______ requires a follow-up visit with your [name]   Comments: I BEEN TRYING TO CALL AND I LEAVE YOU A MESSAGE REGARDING TO YOUR APPT WITH THE GASTROENTEROLOGIST  WITH DR Jonny Ruiz [facility]  . YOU HAVE AN APPT FOR CONSULTATION 09-19-10 @ 2:30 PM AND THE DR 10-02-10 @ 10 :30 am         _________________________________________________________ If you have any questions, please contact our office                     Sincerely,  Cheryll Dessert [facility]

## 2010-10-24 NOTE — Progress Notes (Signed)
Summary: Refer for Colonscopy  Phone Note Outgoing Call   Summary of Call: Pt has seen Dr Coralee Pesa at [facility] (see previous visit in EMR for further details) He would like to have colonscopy scheduled Reason: screening, hx of colitis Initial call taken by: Lehman Prom FNP,  August 28, 2010 10:59 AM  Follow-up for Phone Call        pt have an appt 09-19-10 @ 2:30pm with the nurse and with the Dr 10-02-09 @ [facility] . Leave a voice mail to pt to return my call.Cheryll Dessert  August 29, 2010 3:34 PM

## 2010-10-26 NOTE — Letter (Signed)
Summary: Patient Notice- Polyp Results  [facility]  [address] [place], [place] [address]   Phone: [phone]  Fax: [phone]        October 06, 2010 MRN: [id number]    [facility] [address] [place], [place]  [address]    Dear Mr. Campi,  I am pleased to inform you that the colon polyp(s) removed during your recent colonoscopy was (were) found to be benign (no cancer detected) upon pathologic examination.  I recommend you have a repeat colonoscopy examination in 5 years to look for recurrent polyps, as having colon polyps increases your risk for having recurrent polyps or even colon cancer in the future.  Should you develop new or worsening symptoms of abdominal pain, bowel habit changes or bleeding from the rectum or bowels, please schedule an evaluation with either your primary care physician or with me.  Additional information/recommendations:  __ No further action with gastroenterology is needed at this time. Please      follow-up with your primary care physician for your other healthcare      needs.   Please call us if you are having persistent problems or have questions about your condition that have not been fully answered at this time.  Sincerely,  Hilarie Fredrickson MD  This letter has been electronically signed by your physician.  Appended Document: Patient Notice- Polyp Results LETTER MAILED

## 2010-10-26 NOTE — Miscellaneous (Signed)
Summary: Lec previsit  Clinical Lists Changes  Medications: Added new medication of MOVIPREP 100 GM  SOLR (PEG-KCL-NACL-NASULF-NA ASC-C) As per prep instructions. - Signed Rx of MOVIPREP 100 GM  SOLR (PEG-KCL-NACL-NASULF-NA ASC-C) As per prep instructions.;  #1 x 0;  Signed;  Entered by: Ulis Rias RN;  Authorized by: Hilarie Fredrickson MD;  Method used: Electronically to [facility]. [facility]. [phone]*, [address], [place], [place], [place]  [address], Ph: [phone] or [phone], Fax: [phone] Observations: Added new observation of NKA: T (09/19/2010 15:05)    Prescriptions: MOVIPREP 100 GM  SOLR (PEG-KCL-NACL-NASULF-NA ASC-C) As per prep instructions.  #1 x 0   Entered by:   Ulis Rias RN   Authorized by:   Hilarie Fredrickson MD   Signed by:   Ulis Rias RN on 09/19/2010   Method used:   Electronically to        [facility]. [address] [phone]* (retail)       [address]       [place], [place]  [address]       Ph: [phone] or [phone]       Fax: [phone]   RxID:   [id number]

## 2010-10-26 NOTE — Letter (Signed)
Summary: [facility] Instructions  [facility]  [address] [place], [place] [address]   Phone: [phone]  Fax: [phone]       [name]    20-Apr-1941    MRN: [id number]        Procedure Day Dorna Bloom:  Duanne Limerick 10/02/10     Arrival Time: 9:30am     Procedure Time: 10:30am     [facility] of Procedure:                    [facility]                       PREPARATION FOR COLONOSCOPY WITH MOVIPREP   Starting 5 days prior to your procedure [facility] 01/04/  do not eat nuts, seeds, popcorn, corn, beans, peas,  salads, or any raw vegetables.  Do not take any fiber supplements (e.g. Metamucil, Citrucel, and Benefiber).  THE DAY BEFORE YOUR PROCEDURE         DATE: SUNDAY 01/08  1.  Drink clear liquids the entire day-NO SOLID FOOD  2.  Do not drink anything colored red or purple.  Avoid juices with pulp.  No orange juice.  3.  Drink at least 64 oz. (8 glasses) of fluid/clear liquids during the day to prevent dehydration and help the prep work efficiently.  CLEAR LIQUIDS INCLUDE: Water Jello Ice Popsicles Tea (sugar ok, no milk/cream) Powdered fruit flavored drinks Coffee (sugar ok, no milk/cream) Gatorade Juice: apple, white grape, white cranberry  Lemonade Clear bullion, consomm, broth Carbonated beverages (any kind) Strained chicken noodle soup Hard Candy                             4.  In the morning, mix first dose of MoviPrep solution:    Empty 1 Pouch A and 1 Pouch B into the disposable container    Add lukewarm drinking water to the top line of the container. Mix to dissolve    Refrigerate (mixed solution should be used within 24 hrs)  5.  Begin drinking the prep at 5:00 p.m. The MoviPrep container is divided by 4 marks.   Every 15 minutes drink the solution down to the next mark (approximately 8 oz) until the full liter is complete.   6.  Follow completed prep with 16 oz of clear liquid of your choice (Nothing  red or purple).  Continue to drink clear liquids until bedtime.  7.  Before going to bed, mix second dose of MoviPrep solution:    Empty 1 Pouch A and 1 Pouch B into the disposable container    Add lukewarm drinking water to the top line of the container. Mix to dissolve    Refrigerate  THE DAY OF YOUR PROCEDURE      DATE: MONDAY  01/09  Beginning at  5:30 a.m. (5 hours before procedure):         1. Every 15 minutes, drink the solution down to the next mark (approx 8 oz) until the full liter is complete.  2. Follow completed prep with 16 oz. of clear liquid of your choice.    3. You may drink clear liquids until 8:30am  (2 HOURS BEFORE PROCEDURE).   MEDICATION INSTRUCTIONS  Unless otherwise instructed, you should take regular prescription medications with a small sip of water   as early as possible the morning of your  procedure.         OTHER INSTRUCTIONS  You will need a responsible adult at least 69 years of age to accompany you and drive you home.   This person must remain in the waiting room during your procedure.  Wear loose fitting clothing that is easily removed.  Leave jewelry and other valuables at home.  However, you may wish to bring a book to read or  an iPod/MP3 player to listen to music as you wait for your procedure to start.  Remove all body piercing jewelry and leave at home.  Total time from sign-in until discharge is approximately 2-3 hours.  You should go home directly after your procedure and rest.  You can resume normal activities the  day after your procedure.  The day of your procedure you should not:   Drive   Make legal decisions   Operate machinery   Drink alcohol   Return to work  You will receive specific instructions about eating, activities and medications before you leave.    The above instructions have been reviewed and explained to me by   Ulis Rias RN  September 19, 2010 3:44 PM     I fully understand and can  verbalize these instructions _____________________________ Date _________

## 2010-10-26 NOTE — Letter (Signed)
Summary: Lipid Letter  [facility]  [address] [place], [place] [address]   Phone: [phone]  Fax: [phone]    09/06/2010  [place] Sigmon [address] [place], [place]  [address]  Dear Aletta Edouard:  We have carefully reviewed your last lipid profile from 09/05/2010 and the results are noted below with a summary of recommendations for lipid management.    Cholesterol:       200     Goal: less than 200   HDL "good" Cholesterol:   51     Goal: greater than 40   LDL "bad" Cholesterol:   125     Goal: less than 100   Triglycerides:       119     Goal: less than 150    Cholesterol labs are ok.      Current Medications: 1)    Lisinopril 20 Mg Tabs (Lisinopril) .... One tablet by mouth daily for blood pressure **note change in dose**  If you have any questions, please call. We appreciate being able to work with you.   Sincerely,    [facility] Lehman Prom FNP

## 2010-10-26 NOTE — Procedures (Signed)
Summary: Colonoscopy  Patient: [name] Note: All result statuses are Final unless otherwise noted.  Tests: (1) Colonoscopy (COL)   COL Colonoscopy           DONE     [facility]     [address]     [place], [place]  [address]           COLONOSCOPY PROCEDURE REPORT           PATIENT:  [name], [name]  MR#:  [id number]     BIRTHDATE:  1941/03/09, 70 yrs. old  GENDER:  male     ENDOSCOPIST:  Wilhemina Bonito. Eda Keys, MD     REF. BY:  Lehman Prom, FNP     PROCEDURE DATE:  10/02/2010     PROCEDURE:  Colonoscopy with snare polypectomy x 2     ASA CLASS:  Class II     INDICATIONS:  Routine Risk Screening ; hospitalized fall 2011 w/     pain and CT w/ right sided colitis (clinically resolved)     MEDICATIONS:   Fentanyl 25 mcg IV, Versed 7 mg IV           DESCRIPTION OF PROCEDURE:   After the risks benefits and     alternatives of the procedure were thoroughly explained, informed     consent was obtained.  Digital rectal exam was performed and     revealed no abnormalities.   The LB 180AL [id number] endoscope was     introduced through the anus and advanced to the cecum, which was     identified by both the appendix and ileocecal valve, without     limitations.Time to cecum = 2:32 min. The quality of the prep was     excellent, using MoviPrep.  The instrument was then slowly     withdrawn (time = 16:37 min) as the colon was fully examined.     <<PROCEDUREIMAGES>>           FINDINGS:  Two polyps, 5mm, 9mm, were found in the ascending     colon. Polyps were snared without cautery. Retrieval was     successful. Two small cecal AVMs.  Otherwise normal colonoscopy     without other polyps, masses,  or inflammatory changes.  The     terminal ileum appeared normal.   Retroflexed views in the rectum     revealed internal hemorrhoids.    The scope was then withdrawn     from the patient and the procedure completed.           COMPLICATIONS:  None     ENDOSCOPIC IMPRESSION:     1)  Two polyps in the ascending colon - removed     2) Small cecal AVMs     3) Otherwise normal colonoscopy     4) Normal terminal ileum     5) Internal hemorrhoids           RECOMMENDATIONS:     1) Follow up colonoscopy in 5 years           ______________________________     Wilhemina Bonito. Eda Keys, MD           CC:  Lehman Prom FNP;  The Patient           n.     eSIGNED:   John N. Eda Keys at 10/02/2010 11:31 AM           Allie Dimmer, [id number]  Note: An  exclamation mark (!) indicates a result that was not dispersed into the flowsheet. Document Creation Date: 10/02/2010 11:31 AM _______________________________________________________________________  (1) Order result status: Final Collection or observation date-time: 10/02/2010 11:24 Requested date-time:  Receipt date-time:  Reported date-time:  Referring Physician:   Ordering Physician: Fransico Setters [id number]) Specimen Source:  Source: Launa Grill Order Number: [id number] Lab site:   Appended Document: Colonoscopy recall     Procedures Next Due Date:    Colonoscopy: 09/2015

## 2010-11-14 ENCOUNTER — Encounter (INDEPENDENT_AMBULATORY_CARE_PROVIDER_SITE_OTHER): Payer: Self-pay | Admitting: Nurse Practitioner

## 2010-11-14 ENCOUNTER — Encounter: Payer: Self-pay | Admitting: Nurse Practitioner

## 2010-11-21 NOTE — Assessment & Plan Note (Signed)
Summary: HTN   Vital Signs:  Patient profile:   70 year old male Weight:      153.8 pounds BMI:     24.18 Pulse rhythm:   regular BP sitting:   156 / 100  (left arm) Cuff size:   regular  Vitals Entered By: Levon Hedger (November 14, 2010 9:31 AM) CC: follow-up visit HTN, Hypertension Management Is Patient Diabetic? No Pain Assessment Patient in pain? no       Does patient need assistance? Functional Status Self care Ambulation Normal   Primary Care Provider:  Maralyn Sago, FNP  CC:  follow-up visit HTN and Hypertension Management.  History of Present Illness:  Pt into the office for f/u on htn  Pt did get colonscopy as ordered during last visit.  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  Pt reported that area had been present for many years since childhood.  Asymptomatic.  No increase in size.  Pt has 4 children.  Presents today with Nephew who is interpretering for pt  Hypertension History:      He denies headache, chest pain, and palpitations.  He notes no problems with any antihypertensive medication side effects.  Pt has taken his blood pressure medications already today.  Further comments include: Nephew reports that pt used to take high blood pressure medications back in [place] for which he was prescribed blood pressure at a higher dose.  Nephew is unsure the name of the medication.        Positive major cardiovascular risk factors include male age 38 years old or older and hypertension.  Negative major cardiovascular risk factors include no history of diabetes or hyperlipidemia and non-tobacco-user status.        Further assessment for target organ damage reveals no history of ASHD, cardiac end-organ damage (CHF/LVH), stroke/TIA, peripheral vascular disease, renal insufficiency, or hypertensive retinopathy.     Allergies (verified): No Known Drug Allergies  Review of Systems General:  Denies fever. CV:  Denies chest pain or  discomfort. Resp:  Denies cough. GI:  Denies abdominal pain, nausea, and vomiting. GU:  Denies urinary frequency.  Physical Exam  General:  alert.   Mouth:  teeth missing.   Lungs:  normal breath sounds.   Heart:  normal rate and regular rhythm.   Abdomen:  normal bowel sounds.   Msk:  up to the exam table Neurologic:  alert & oriented X3.   Skin:  color normal.   Psych:  Oriented X3.     Impression & Recommendations:  Problem # 1:  HYPERTENSION, BENIGN ESSENTIAL (ICD-401.1) BP is still not controlled will increase lisinopril to 40mg  by mouth daily DASH diet His updated medication list for this problem includes:    Lisinopril 40 Mg Tabs (Lisinopril) ..... 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 One tablet by mouth daily for blood pressure **note change in dose**  Hypertension Assessment/Plan:      The patient's hypertensive risk group is category B: At least one risk factor (excluding diabetes) with no target organ damage.  His calculated 10 year risk of coronary heart disease is 27 %.  Today's blood pressure is 156/100.  His blood pressure goal  is < 140/90.  Patient Instructions: 1)  Blood pressure - still elevated. 2)  Will increase lisinopril to 40mg  by mouth daily. 3)  Take 20mg  - 2 tablets by mouth daily for the pills you already have.  When you finish with these get the prescription filled for the 40mg  tabs and take ONE per day 4)  Testicular enlargement - You have declined ultrasound and further work up at this time.  If you change your mind then inform this provider 5)  Schedule an appointment in 4 weeks with triage nurse for blood pressure check.  Take your medications before  this visit. Goal <140/90 Prescriptions: LISINOPRIL 40 MG TABS (LISINOPRIL) One tablet by mouth daily for blood pressure **note change in dose**  #30 x 11   Entered and Authorized by:   Lehman Prom FNP   Signed by:   Lehman Prom FNP on 11/14/2010   Method used:   Print then Give to Patient   RxID:   [id number]    Orders Added: 1)  Est. Patient Level III [62952]

## 2010-12-08 LAB — POCT URINALYSIS DIPSTICK
Glucose, UA: NEGATIVE mg/dL
Nitrite: NEGATIVE
Specific Gravity, Urine: 1.02 (ref 1.005–1.030)
Urobilinogen, UA: 0.2 mg/dL (ref 0.0–1.0)

## 2010-12-08 LAB — POCT I-STAT, CHEM 8
Calcium, Ion: 1.13 mmol/L (ref 1.12–1.32)
Chloride: 106 mEq/L (ref 96–112)
Creatinine, Ser: 0.9 mg/dL (ref 0.4–1.5)
Glucose, Bld: 112 mg/dL — ABNORMAL HIGH (ref 70–99)
HCT: 48 % (ref 39.0–52.0)
Hemoglobin: 16.3 g/dL (ref 13.0–17.0)
Potassium: 4.3 mEq/L (ref 3.5–5.1)

## 2010-12-08 LAB — CBC
HCT: 43.2 % (ref 39.0–52.0)
Hemoglobin: 15.4 g/dL (ref 13.0–17.0)
Platelets: 167 10*3/uL (ref 150–400)
RDW: 12.9 % (ref 11.5–15.5)

## 2010-12-08 LAB — DIFFERENTIAL
Basophils Relative: 0 % (ref 0–1)
Eosinophils Absolute: 0.3 10*3/uL (ref 0.0–0.7)
Eosinophils Relative: 2 % (ref 0–5)
Neutrophils Relative %: 70 % (ref 43–77)

## 2010-12-09 LAB — CLOSTRIDIUM DIFFICILE EIA: C difficile Toxins A+B, EIA: NEGATIVE

## 2010-12-09 LAB — COMPREHENSIVE METABOLIC PANEL
ALT: 34 U/L (ref 0–53)
AST: 52 U/L — ABNORMAL HIGH (ref 0–37)
Albumin: 3.3 g/dL — ABNORMAL LOW (ref 3.5–5.2)
Albumin: 4.2 g/dL (ref 3.5–5.2)
BUN: 5 mg/dL — ABNORMAL LOW (ref 6–23)
CO2: 24 mEq/L (ref 19–32)
Calcium: 9.1 mg/dL (ref 8.4–10.5)
Creatinine, Ser: 0.76 mg/dL (ref 0.4–1.5)
GFR calc Af Amer: >60 mL/min (ref >60)
Glucose, Bld: 130 mg/dL — ABNORMAL HIGH (ref 70–99)
Potassium: 3.5 mEq/L (ref 3.5–5.1)
Total Bilirubin: 1.5 mg/dL — ABNORMAL HIGH (ref 0.3–1.2)

## 2010-12-09 LAB — BASIC METABOLIC PANEL
BUN: 4 mg/dL — ABNORMAL LOW (ref 6–23)
CO2: 24 mEq/L (ref 19–32)
Calcium: 8.5 mg/dL (ref 8.4–10.5)
Calcium: 8.7 mg/dL (ref 8.4–10.5)
Creatinine, Ser: 0.76 mg/dL (ref 0.4–1.5)
GFR calc Af Amer: >60 mL/min (ref >60)
GFR calc non Af Amer: >60 mL/min (ref >60)
GFR calc non Af Amer: >60 mL/min (ref >60)
Glucose, Bld: 111 mg/dL — ABNORMAL HIGH (ref 70–99)
Glucose, Bld: 112 mg/dL — ABNORMAL HIGH (ref 70–99)
Sodium: 137 mEq/L (ref 135–145)

## 2010-12-09 LAB — CBC
HCT: 39.8 % (ref 39.0–52.0)
HCT: 40.3 % (ref 39.0–52.0)
Hemoglobin: 14 g/dL (ref 13.0–17.0)
Hemoglobin: 14 g/dL (ref 13.0–17.0)
Platelets: 130 10*3/uL — ABNORMAL LOW (ref 150–400)
RBC: 4.19 MIL/uL — ABNORMAL LOW (ref 4.22–5.81)
RBC: 4.21 MIL/uL — ABNORMAL LOW (ref 4.22–5.81)
RDW: 12.9 % (ref 11.5–15.5)
RDW: 13.5 % (ref 11.5–15.5)
WBC: 6.8 10*3/uL (ref 4.0–10.5)

## 2010-12-09 LAB — FECAL LACTOFERRIN, QUANT

## 2010-12-09 LAB — CULTURE, BLOOD (ROUTINE X 2): Culture: NO GROWTH

## 2010-12-09 LAB — DIFFERENTIAL
Basophils Relative: 0 % (ref 0–1)
Eosinophils Absolute: 0.2 10*3/uL (ref 0.0–0.7)
Eosinophils Relative: 1 % (ref 0–5)
Lymphs Abs: 1.3 10*3/uL (ref 0.7–4.0)
Monocytes Absolute: 1.7 10*3/uL — ABNORMAL HIGH (ref 0.1–1.0)

## 2010-12-09 LAB — GIARDIA/CRYPTOSPORIDIUM SCREEN(EIA)
Cryptosporidium Screen (EIA): NEGATIVE
Cryptosporidium Screen (EIA): NEGATIVE
Giardia Screen - EIA: NEGATIVE

## 2010-12-09 LAB — URINALYSIS, ROUTINE W REFLEX MICROSCOPIC
Bilirubin Urine: NEGATIVE
Glucose, UA: NEGATIVE mg/dL
Hgb urine dipstick: NEGATIVE
Ketones, ur: NEGATIVE mg/dL
Nitrite: NEGATIVE

## 2010-12-09 LAB — LACTIC ACID, PLASMA: Lactic Acid, Venous: 2.7 mmol/L — ABNORMAL HIGH (ref 0.5–2.2)

## 2010-12-12 ENCOUNTER — Encounter (INDEPENDENT_AMBULATORY_CARE_PROVIDER_SITE_OTHER): Payer: Self-pay | Admitting: Nurse Practitioner

## 2010-12-12 ENCOUNTER — Encounter: Payer: Self-pay | Admitting: Nurse Practitioner

## 2010-12-21 NOTE — Assessment & Plan Note (Signed)
Summary: BP recheck  Nurse Visit   Vital Signs:  Patient profile:   70 year old male Weight:      153.9 pounds Temp:     98 degrees F oral Pulse rate:   81 / minute Pulse rhythm:   regular Resp:     24 per minute BP sitting:   138 / 88  (left arm) Cuff size:   regular  Vitals Entered By: Dutch Quint RN (December 12, 2010 9:56 AM)  Patient Instructions: 1)  Your blood pressure is much better. 2)  You've been given handouts on low-sodium diets and how to season without using salt.  They should help you decide foods to help lower your blood pressure. 3)  Continue taking medications as ordered.  Make sure you call for refills before you run out. 4)  Schedule follow-up appointment with provider for 3 months. 5)  Call if anything changes or if you have any questions.   Primary Care Provider:  Maralyn Sago, FNP  CC:  BP recheck.  History of Present Illness: /21/12 - BP 156/100.  Lisinopril increased to 40 mg. daily, DASH diet given.  Goal is <140/90.  Has been taking as ordered, no missed doses.  Took meds at 7 am.   Review of Systems       Denies SOB, CP, HA, peripheral edema.  Has occasional cough, not worsened by lisinopril.  Occasional postural lightheadedness, no fainting.  No visual changes, sometimes see black spots in visual field.   Physical Exam  General:  alert, well-developed, well-nourished, and well-hydrated.     Impression & Recommendations:  Problem # 1:  HYPERTENSION, BENIGN ESSENTIAL (ICD-401.1) BP much better Continue meds as ordered Given handouts for low-sodium diet Schedule f/u for BP with provider for 3 months   His updated medication list for this problem includes:    Lisinopril 40 Mg Tabs (Lisinopril) ..... One tablet by mouth daily for blood pressure **note change in dose**  Complete Medication List: 1)  Lisinopril 40 Mg Tabs (Lisinopril) .... One tablet by mouth daily for blood pressure **note change in dose**  CC: BP recheck Is Patient  Diabetic? No Pain Assessment Patient in pain? no       Does patient need assistance? Functional Status Self care Ambulation Normal   Allergies: No Known Drug Allergies  Orders Added: 1)  Est. Patient Level I [16109]

## 2011-06-02 ENCOUNTER — Inpatient Hospital Stay (INDEPENDENT_AMBULATORY_CARE_PROVIDER_SITE_OTHER)
Admission: RE | Admit: 2011-06-02 | Discharge: 2011-06-02 | Disposition: A | Discharge status: Home / Self Care | Payer: Medicaid Other | Source: Ambulatory Visit | Attending: Emergency Medicine | Admitting: Emergency Medicine

## 2011-06-02 DIAGNOSIS — B86 Scabies: Secondary | ICD-10-CM

## 2012-02-20 ENCOUNTER — Emergency Department (HOSPITAL_COMMUNITY)
Admission: EM | Admit: 2012-02-20 | Discharge: 2012-02-20 | Disposition: A | Discharge status: Other Acute Inpatient Hospital | Payer: Medicaid Other | Source: Home / Self Care | Attending: Emergency Medicine | Admitting: Emergency Medicine

## 2012-02-20 ENCOUNTER — Encounter (HOSPITAL_COMMUNITY): Payer: Self-pay

## 2012-02-20 ENCOUNTER — Emergency Department (HOSPITAL_COMMUNITY): Payer: Medicaid Other

## 2012-02-20 ENCOUNTER — Observation Stay (HOSPITAL_COMMUNITY)
Admission: EM | Admit: 2012-02-20 | Discharge: 2012-02-21 | Disposition: A | Discharge status: Home / Self Care | Payer: Medicaid Other | Attending: General Surgery | Admitting: General Surgery

## 2012-02-20 ENCOUNTER — Encounter (HOSPITAL_COMMUNITY): Payer: Self-pay | Admitting: Emergency Medicine

## 2012-02-20 DIAGNOSIS — K3580 Unspecified acute appendicitis: Principal | ICD-10-CM | POA: Insufficient documentation

## 2012-02-20 DIAGNOSIS — I10 Essential (primary) hypertension: Secondary | ICD-10-CM | POA: Insufficient documentation

## 2012-02-20 DIAGNOSIS — R109 Unspecified abdominal pain: Secondary | ICD-10-CM | POA: Insufficient documentation

## 2012-02-20 DIAGNOSIS — M25519 Pain in unspecified shoulder: Secondary | ICD-10-CM | POA: Insufficient documentation

## 2012-02-20 DIAGNOSIS — K37 Unspecified appendicitis: Secondary | ICD-10-CM

## 2012-02-20 HISTORY — DX: Essential (primary) hypertension: I10

## 2012-02-20 LAB — CBC
HCT: 47.5 % (ref 39.0–52.0)
Platelets: 187 10*3/uL (ref 150–400)
RDW: 12.9 % (ref 11.5–15.5)
WBC: 20.6 10*3/uL — ABNORMAL HIGH (ref 4.0–10.5)

## 2012-02-20 LAB — DIFFERENTIAL
Basophils Absolute: 0 10*3/uL (ref 0.0–0.1)
Lymphocytes Relative: 10 % — ABNORMAL LOW (ref 12–46)
Monocytes Absolute: 2.4 10*3/uL — ABNORMAL HIGH (ref 0.1–1.0)
Neutro Abs: 15.9 10*3/uL — ABNORMAL HIGH (ref 1.7–7.7)

## 2012-02-20 LAB — URINE MICROSCOPIC-ADD ON

## 2012-02-20 LAB — URINALYSIS, ROUTINE W REFLEX MICROSCOPIC
Nitrite: NEGATIVE
Specific Gravity, Urine: 1.01 (ref 1.005–1.030)
pH: 6 (ref 5.0–8.0)

## 2012-02-20 LAB — POCT I-STAT, CHEM 8
Calcium, Ion: 1.21 mmol/L (ref 1.12–1.32)
Glucose, Bld: 144 mg/dL — ABNORMAL HIGH (ref 70–99)
HCT: 53 % — ABNORMAL HIGH (ref 39.0–52.0)
Hemoglobin: 18 g/dL — ABNORMAL HIGH (ref 13.0–17.0)

## 2012-02-20 LAB — POCT URINALYSIS DIP (DEVICE)
Glucose, UA: NEGATIVE mg/dL
Protein, ur: NEGATIVE mg/dL

## 2012-02-20 MED ORDER — SODIUM CHLORIDE 0.9 % IV BOLUS (SEPSIS)
1000.0000 mL | Freq: Once | INTRAVENOUS | Status: AC
  Administered 2012-02-20: 1000 mL via INTRAVENOUS

## 2012-02-20 MED ORDER — HYDROMORPHONE HCL PF 1 MG/ML IJ SOLN
1.0000 mg | Freq: Once | INTRAMUSCULAR | Status: AC
  Administered 2012-02-20: 1 mg via INTRAVENOUS
  Filled 2012-02-20: qty 1

## 2012-02-20 MED ORDER — ONDANSETRON HCL 4 MG/2ML IJ SOLN
4.0000 mg | Freq: Once | INTRAMUSCULAR | Status: AC
  Administered 2012-02-20: 4 mg via INTRAVENOUS
  Filled 2012-02-20: qty 2

## 2012-02-20 NOTE — ED Provider Notes (Signed)
Chief Complaint  Patient presents with  . Abdominal Pain    History of Present Illness:   The patient is a 71 year-old [place] male with a two-day history of right lower quadrant abdominal pain. The pain is severe and associated with anorexia but no fever, nausea, vomiting, diarrhea, although he has had some pain with urination. He also admits to a one-week history of bilateral shoulder pain. This hurts worse with movement of the shoulder. He denies any chest pain, pressure, or tightness. He's had no shortness of breath. He speaks no [place]. The history was obtained with the help of his daughter who served as interpreter and speaks near perfect English.  Review of Systems:  Other than noted above, the patient denies any of the following symptoms: Constitutional:  No fever, chills, fatigue, weight loss or anorexia. Lungs:  No cough or shortness of breath. Heart:  No chest pain, palpitations, syncope or edema.  No cardiac history. Abdomen:  No nausea, vomiting, hematememesis, melena, diarrhea, or hematochezia. GU:  No dysuria, frequency, urgency, or hematuria.  No testicular pain or swelling. Skin:  No rash or itching.  PMFSH:  Past medical history, family history, social history, meds, and allergies were reviewed.  No prior abdominal surgeries or history of GI problems.  No use of NSAIDs or aspirin.  No excessive  alcohol intake.  Physical Exam:   Vital signs:  BP 123/86  Pulse 115  Temp(Src) 98.8 F (37.1 C) (Oral)  Resp 24  SpO2 98% Gen:  Alert, oriented, in no distress. Lungs:  Breath sounds clear and equal bilaterally.  No wheezes, rales or rhonchi. Heart:  Regular rhythm.  No gallops or murmers.   Abdomen:  Abdomen is somewhat rigid but nondistended. He has exquisite tenderness to palpation in the right lower quadrant with guarding and rebound. No organomegaly or mass. Bowel sounds are absent. Skin:  Clear, warm and dry.  No rash.  Labs:   Results for orders placed during the  hospital encounter of 02/20/12  POCT URINALYSIS DIP (DEVICE)      Component Value Range   Glucose, UA NEGATIVE  NEGATIVE (mg/dL)   Bilirubin Urine NEGATIVE  NEGATIVE    Ketones, ur TRACE (*) NEGATIVE (mg/dL)   Specific Gravity, Urine <=1.005  1.005 - 1.030    Hgb urine dipstick SMALL (*) NEGATIVE    pH 6.0  5.0 - 8.0    Protein, ur NEGATIVE  NEGATIVE (mg/dL)   Urobilinogen, UA 0.2  0.0 - 1.0 (mg/dL)   Nitrite NEGATIVE  NEGATIVE    Leukocytes, UA TRACE (*) NEGATIVE      Assessment:  The encounter diagnosis was Appendicitis.  Plan:   1.  The following meds were prescribed:   New Prescriptions   No medications on file   2.  The patient was transported to the emergency department via shuttle.  Reuben Likes, MD 02/20/12 Jerene Bears

## 2012-02-20 NOTE — Discharge Instructions (Signed)
We have determined that your problem requires further evaluation in the emergency department.  We will take care of your transport there.  Once at the emergency department, you will be evaluated by a provider and they will order whatever treatment or tests they deem necessary.  We cannot guarantee that they will do any specific test or do any specific treatment.    Appendicitis Appendicitis is when the appendix is swollen (inflamed). The inflammation can lead to developing a hole (perforation) and a collection of pus (abscess). CAUSES  There is not always an obvious cause of appendicitis. Sometimes it is caused by an obstruction in the appendix. The obstruction can be caused by:  A small, hard, pea-sized ball of stool (fecalith).   Enlarged lymph glands in the appendix.  SYMPTOMS   Pain around your belly button (navel) that moves toward your lower right belly (abdomen). The pain can become more severe and sharp as time passes.   Tenderness in the lower right abdomen. Pain gets worse if you cough or make a sudden movement.   Feeling sick to your stomach (nauseous).   Throwing up (vomiting).   Loss of appetite.   Fever.   Constipation.   Diarrhea.   Generally not feeling well.  DIAGNOSIS   Physical exam.   Blood tests.   Urine test.   X-rays or a CT scan may confirm the diagnosis.  TREATMENT  Once the diagnosis of appendicitis is made, the most common treatment is to remove the appendix as soon as possible. This procedure is called appendectomy. In an open appendectomy, a cut (incision) is made in the lower right abdomen and the appendix is removed. In a laparoscopic appendectomy, usually 3 small incisions are made. Long, thin instruments and a camera tube are used to remove the appendix. Most patients go home in 24 to 28 hours after appendectomy. In some situations, the appendix may have already perforated and an abscess may have formed. The abscess may have a "wall" around it  as seen on a CT scan. In this case, a drain may be placed into the abscess and you may be treated with medicines (antibiotics) that kill germs. Once the abscess has resolved, it may or may not be necessary to have an appendectomy. Document Released: 09/10/2005 Document Revised: 08/30/2011 Document Reviewed: 12/06/2009 Exit[employer] Patient Information 2012 [employer]. 

## 2012-02-20 NOTE — ED Notes (Signed)
Per family member, pt has  Been having pain in both shoulders, pain RLQ and pain w urination since last PM;

## 2012-02-20 NOTE — ED Provider Notes (Signed)
History     CSN: [id number]  Arrival date & time 02/20/12  7829   First MD Initiated Contact with Patient 02/20/12 2121      Chief Complaint  Patient presents with  . Abdominal Pain    (Consider location/radiation/quality/duration/timing/severity/associated sxs/prior treatment) HPI Comments: Patient here with daughter who is interpreting for this patient.  He [facility] to [facility] with complaints of bilateral shoulder pain with movement and RLQ abdominal pain that started suddenly last night - she reports he has no appetite since then and has not eaten since last night - she reports no fever, chills, nausea, vomiting, diarrhea or constipation.  PMH significant only for HTN, denies abdominal surgeries or NSAID use.  Patient is a 71 y.o. male presenting with abdominal pain. The history is provided by the patient and a relative. The history is limited by a language barrier. A language interpreter was used (daughter speaks fluent [place]).  Abdominal Pain The primary symptoms of the illness include abdominal pain and nausea. The primary symptoms of the illness do not include fever, fatigue, shortness of breath, vomiting, diarrhea, hematemesis, hematochezia or dysuria. The current episode started yesterday. The onset of the illness was gradual. The problem has not changed since onset. The patient has not had a change in bowel habit. Risk factors for an acute abdominal problem include being elderly. Additional symptoms associated with the illness include anorexia. Symptoms associated with the illness do not include chills, diaphoresis, heartburn, constipation, urgency, hematuria, frequency or back pain.    Past Medical History  Diagnosis Date  . Hypertension     History reviewed. No pertinent past surgical history.  History reviewed. No pertinent family history.  History  Substance Use Topics  . Smoking status: Never Smoker   . Smokeless tobacco: Not on file  .  Alcohol Use: No      Review of Systems  Constitutional: Negative for fever, chills, diaphoresis and fatigue.  Respiratory: Negative for shortness of breath.   Gastrointestinal: Positive for nausea, abdominal pain and anorexia. Negative for heartburn, vomiting, diarrhea, constipation, hematochezia and hematemesis.  Genitourinary: Negative for dysuria, urgency, frequency and hematuria.  Musculoskeletal: Negative for back pain.  All other systems reviewed and are negative.    Allergies  Review of patient's allergies indicates no known allergies.  Home Medications   Current Outpatient Rx  Name Route Sig Dispense Refill  . LISINOPRIL-HYDROCHLOROTHIAZIDE 20-12.5 MG PO TABS Oral Take 2 tablets by mouth daily.      BP 127/85  Pulse 101  Temp(Src) 98 F (36.7 C) (Oral)  Resp 16  SpO2 94%  Physical Exam  Nursing note and vitals reviewed. Constitutional: He is oriented to person, place, and time. He appears well-developed and well-nourished. No distress.  HENT:  Head: Normocephalic and atraumatic.  Right Ear: External ear normal.  Left Ear: External ear normal.  Nose: Nose normal.  Mouth/Throat: Oropharynx is clear and moist. No oropharyngeal exudate.  Eyes: Conjunctivae are normal. Pupils are equal, round, and reactive to light. No scleral icterus.  Neck: Normal range of motion. Neck supple.  Cardiovascular: Regular rhythm and normal heart sounds.  Exam reveals no gallop and no friction rub.   No murmur heard.      tachycardia  Pulmonary/Chest: Effort normal and breath sounds normal. No respiratory distress. He has no wheezes. He has no rales. He exhibits no tenderness.  Abdominal: Soft. Bowel sounds are normal. He exhibits no distension and no mass. There is tenderness. There is guarding.  There is no rebound.       RLQ ttp - + McBurney point tenderness  Musculoskeletal:       Right shoulder: He exhibits tenderness. He exhibits normal range of motion, no bony tenderness, no  swelling, no effusion and normal pulse.       Left shoulder: He exhibits tenderness. He exhibits normal range of motion, no bony tenderness, no swelling, no effusion and normal pulse.  Lymphadenopathy:    He has no cervical adenopathy.  Neurological: He is alert and oriented to person, place, and time. No cranial nerve deficit. He exhibits normal muscle tone. Coordination normal.  Skin: Skin is warm and dry. No rash noted. No erythema. No pallor.  Psychiatric: He has a normal mood and affect. His behavior is normal. Judgment and thought content normal.    ED Course  Procedures (including critical care time)  Labs Reviewed  CBC - Abnormal; Notable for the following:    WBC 20.6 (*)    Hemoglobin 17.3 (*)    MCH 34.1 (*)    MCHC 36.4 (*)    All other components within normal limits  DIFFERENTIAL - Abnormal; Notable for the following:    Neutro Abs 15.9 (*)    Lymphocytes Relative 10 (*)    Monocytes Absolute 2.4 (*)    All other components within normal limits  URINALYSIS, ROUTINE W REFLEX MICROSCOPIC - Abnormal; Notable for the following:    Color, Urine AMBER (*) BIOCHEMICALS MAY BE AFFECTED BY COLOR   Hgb urine dipstick SMALL (*)    Bilirubin Urine SMALL (*)    Ketones, ur 15 (*)    Leukocytes, UA TRACE (*)    All other components within normal limits  POCT I-STAT, CHEM 8 - Abnormal; Notable for the following:    Sodium 134 (*)    Glucose, Bld 144 (*)    Hemoglobin 18.0 (*)    HCT 53.0 (*)    All other components within normal limits  URINE MICROSCOPIC-ADD ON   No results found. Results for orders placed during the hospital encounter of 02/20/12  CBC      Component Value Range   WBC 20.6 (*) 4.0 - 10.5 (K/uL)   RBC 5.08  4.22 - 5.81 (MIL/uL)   Hemoglobin 17.3 (*) 13.0 - 17.0 (g/dL)   HCT 04.5  40.9 - 81.1 (%)   MCV 93.5  78.0 - 100.0 (fL)   MCH 34.1 (*) 26.0 - 34.0 (pg)   MCHC 36.4 (*) 30.0 - 36.0 (g/dL)   RDW 91.4  78.2 - 95.6 (%)   Platelets 187  150 - 400 (K/uL)   DIFFERENTIAL      Component Value Range   Neutrophils Relative 77  43 - 77 (%)   Neutro Abs 15.9 (*) 1.7 - 7.7 (K/uL)   Lymphocytes Relative 10 (*) 12 - 46 (%)   Lymphs Abs 2.2  0.7 - 4.0 (K/uL)   Monocytes Relative 12  3 - 12 (%)   Monocytes Absolute 2.4 (*) 0.1 - 1.0 (K/uL)   Eosinophils Relative 0  0 - 5 (%)   Eosinophils Absolute 0.1  0.0 - 0.7 (K/uL)   Basophils Relative 0  0 - 1 (%)   Basophils Absolute 0.0  0.0 - 0.1 (K/uL)  URINALYSIS, ROUTINE W REFLEX MICROSCOPIC      Component Value Range   Color, Urine AMBER (*) YELLOW    APPearance CLEAR  CLEAR    Specific Gravity, Urine 1.010  1.005 - 1.030  pH 6.0  5.0 - 8.0    Glucose, UA NEGATIVE  NEGATIVE (mg/dL)   Hgb urine dipstick SMALL (*) NEGATIVE    Bilirubin Urine SMALL (*) NEGATIVE    Ketones, ur 15 (*) NEGATIVE (mg/dL)   Protein, ur NEGATIVE  NEGATIVE (mg/dL)   Urobilinogen, UA 1.0  0.0 - 1.0 (mg/dL)   Nitrite NEGATIVE  NEGATIVE    Leukocytes, UA TRACE (*) NEGATIVE   URINE MICROSCOPIC-ADD ON      Component Value Range   Squamous Epithelial / LPF RARE  RARE    WBC, UA 0-2  <3 (WBC/hpf)   RBC / HPF 0-2  <3 (RBC/hpf)   Bacteria, UA RARE  RARE   POCT I-STAT, CHEM 8      Component Value Range   Sodium 134 (*) 135 - 145 (mEq/L)   Potassium 4.7  3.5 - 5.1 (mEq/L)   Chloride 97  96 - 112 (mEq/L)   BUN 10  6 - 23 (mg/dL)   Creatinine, Ser 2.13  0.50 - 1.35 (mg/dL)   Glucose, Bld 086 (*) 70 - 99 (mg/dL)   Calcium, Ion 5.78  4.69 - 1.32 (mmol/L)   TCO2 28  0 - 100 (mmol/L)   Hemoglobin 18.0 (*) 13.0 - 17.0 (g/dL)   HCT 62.9 (*) 52.8 - 52.0 (%)   Ct Abdomen Pelvis W Contrast  02/21/2012  *RADIOLOGY REPORT*  Clinical Data: Right lower quadrant pain, nausea and vomiting.  CT ABDOMEN AND PELVIS WITH CONTRAST  Technique:  Multidetector CT imaging of the abdomen and pelvis was performed following the standard protocol during bolus administration of intravenous contrast.  Contrast: 80mL OMNIPAQUE IOHEXOL 300 MG/ML  SOLN   Comparison: 04/20/2010  Findings: Atelectasis or infiltration in the lung bases.  Diffuse low attenuation change throughout the liver consistent with fatty infiltration.  The gallbladder, spleen, pancreas, adrenal glands, kidneys, and retroperitoneal lymph nodes are unremarkable. Calcification of the abdominal aorta without aneurysm.  The stomach and small bowel are not dilated.  Predominately gas filled colon without distension or wall thickening.  Pelvis:  The appendix is distended and fluid-filled with an enhancing wall, measuring 12 mm diameter.  There is periappendiceal infiltration.  The changes are consistent with acute appendicitis. Mild enlargement prostate gland, measuring 5.4 x 4 cm.  No bladder wall thickening.  No free or loculated pelvic fluid collections. No significant pelvic lymphadenopathy.  Degenerative changes in the lumbar spine.  IMPRESSION: Distended fluid filled appendix with periappendiceal inflammation consistent with acute appendicitis.  No focal abscess.  Original Report Authenticated By: Marlon Pel, M.D.      Acute appendicitis    MDM  Patient with a day history of RLQ abdominal pain here with acute appendicitis - Dr. Ignacia Palma has seen the patient and will be contacting surgery for admission.        Izola Price [place], [place] 02/21/12 [phone]

## 2012-02-20 NOTE — ED Notes (Signed)
Patient with abdominal pain and bilat shoulder pain since yesterday.  Denies any nausea or vomiting.  Denies any urinary symptoms.

## 2012-02-21 ENCOUNTER — Encounter (HOSPITAL_COMMUNITY): Payer: Self-pay | Admitting: Radiology

## 2012-02-21 ENCOUNTER — Encounter (HOSPITAL_COMMUNITY): Payer: Self-pay | Admitting: Certified Registered"

## 2012-02-21 ENCOUNTER — Emergency Department (HOSPITAL_COMMUNITY): Payer: Medicaid Other | Admitting: Certified Registered"

## 2012-02-21 ENCOUNTER — Encounter (HOSPITAL_COMMUNITY)
Admission: EM | Disposition: A | Discharge status: Home / Self Care | Payer: Self-pay | Source: Home / Self Care | Attending: Emergency Medicine

## 2012-02-21 ENCOUNTER — Emergency Department (HOSPITAL_COMMUNITY): Payer: Medicaid Other

## 2012-02-21 DIAGNOSIS — K3580 Unspecified acute appendicitis: Secondary | ICD-10-CM

## 2012-02-21 HISTORY — PX: LAPAROSCOPIC APPENDECTOMY: SHX408

## 2012-02-21 SURGERY — APPENDECTOMY, LAPAROSCOPIC
Anesthesia: General | Site: Abdomen | Wound class: Contaminated

## 2012-02-21 MED ORDER — DEXAMETHASONE SODIUM PHOSPHATE 4 MG/ML IJ SOLN
INTRAMUSCULAR | Status: DC | PRN
  Administered 2012-02-21: 4 mg via INTRAVENOUS

## 2012-02-21 MED ORDER — ACETAMINOPHEN 325 MG PO TABS: 650.0000 mg | ORAL_TABLET | ORAL | Status: DC | PRN

## 2012-02-21 MED ORDER — LIDOCAINE HCL 4 % MT SOLN
OROMUCOSAL | Status: DC | PRN
  Administered 2012-02-21: 4 mL via TOPICAL

## 2012-02-21 MED ORDER — EPHEDRINE SULFATE 50 MG/ML IJ SOLN
INTRAMUSCULAR | Status: DC | PRN
  Administered 2012-02-21 (×2): 10 mg via INTRAVENOUS

## 2012-02-21 MED ORDER — IOHEXOL 300 MG/ML  SOLN
80.0000 mL | Freq: Once | INTRAMUSCULAR | Status: AC | PRN
  Administered 2012-02-21: 80 mL via INTRAVENOUS

## 2012-02-21 MED ORDER — GLYCOPYRROLATE 0.2 MG/ML IJ SOLN
INTRAMUSCULAR | Status: DC | PRN
  Administered 2012-02-21: 0.4 mg via INTRAVENOUS

## 2012-02-21 MED ORDER — LIDOCAINE HCL (CARDIAC) 20 MG/ML IV SOLN
INTRAVENOUS | Status: DC | PRN
  Administered 2012-02-21: 100 mg via INTRAVENOUS

## 2012-02-21 MED ORDER — ONDANSETRON HCL 4 MG/2ML IJ SOLN: 4.0000 mg | Freq: Once | INTRAMUSCULAR | Status: DC | PRN

## 2012-02-21 MED ORDER — HYDROCHLOROTHIAZIDE 25 MG PO TABS
25.0000 mg | ORAL_TABLET | Freq: Every day | ORAL | Status: DC
  Administered 2012-02-21: 25 mg via ORAL
  Filled 2012-02-21: qty 1

## 2012-02-21 MED ORDER — LISINOPRIL-HYDROCHLOROTHIAZIDE 20-12.5 MG PO TABS: 2.0000 | ORAL_TABLET | Freq: Every day | ORAL | Status: DC

## 2012-02-21 MED ORDER — KCL IN DEXTROSE-NACL 20-5-0.45 MEQ/L-%-% IV SOLN
INTRAVENOUS | Status: DC
  Administered 2012-02-21: 05:00:00 via INTRAVENOUS
  Filled 2012-02-21 (×3): qty 1000

## 2012-02-21 MED ORDER — LACTATED RINGERS IV SOLN
INTRAVENOUS | Status: DC | PRN
  Administered 2012-02-21 (×2): via INTRAVENOUS

## 2012-02-21 MED ORDER — SODIUM CHLORIDE 0.9 % IR SOLN
Status: DC | PRN
  Administered 2012-02-21: 1000 mL

## 2012-02-21 MED ORDER — SODIUM CHLORIDE 0.9 % IV SOLN
1.0000 g | Freq: Once | INTRAVENOUS | Status: AC
  Administered 2012-02-21: 1 g via INTRAVENOUS
  Filled 2012-02-21: qty 1

## 2012-02-21 MED ORDER — LISINOPRIL 40 MG PO TABS
40.0000 mg | ORAL_TABLET | Freq: Every day | ORAL | Status: DC
  Administered 2012-02-21: 40 mg via ORAL
  Filled 2012-02-21: qty 1

## 2012-02-21 MED ORDER — ROCURONIUM BROMIDE 100 MG/10ML IV SOLN
INTRAVENOUS | Status: DC | PRN
  Administered 2012-02-21: 25 mg via INTRAVENOUS

## 2012-02-21 MED ORDER — HYDROMORPHONE HCL PF 1 MG/ML IJ SOLN: 0.5000 mg | INTRAMUSCULAR | Status: DC | PRN

## 2012-02-21 MED ORDER — NEOSTIGMINE METHYLSULFATE 1 MG/ML IJ SOLN
INTRAMUSCULAR | Status: DC | PRN
  Administered 2012-02-21: 3 mg via INTRAVENOUS

## 2012-02-21 MED ORDER — HYDROMORPHONE HCL PF 1 MG/ML IJ SOLN
INTRAMUSCULAR | Status: AC
  Filled 2012-02-21: qty 1

## 2012-02-21 MED ORDER — ONDANSETRON HCL 4 MG/2ML IJ SOLN
INTRAMUSCULAR | Status: DC | PRN
  Administered 2012-02-21: 4 mg via INTRAVENOUS

## 2012-02-21 MED ORDER — BUPIVACAINE-EPINEPHRINE 0.25% -1:200000 IJ SOLN
INTRAMUSCULAR | Status: DC | PRN
  Administered 2012-02-21: 17 mL

## 2012-02-21 MED ORDER — SUFENTANIL CITRATE 50 MCG/ML IV SOLN
INTRAVENOUS | Status: DC | PRN
  Administered 2012-02-21: 20 ug via INTRAVENOUS

## 2012-02-21 MED ORDER — ENOXAPARIN SODIUM 40 MG/0.4ML ~~LOC~~ SOLN: 40.0000 mg | SUBCUTANEOUS | Status: DC

## 2012-02-21 MED ORDER — HYDROMORPHONE HCL PF 1 MG/ML IJ SOLN
0.2500 mg | INTRAMUSCULAR | Status: DC | PRN
  Administered 2012-02-21 (×2): 0.5 mg via INTRAVENOUS

## 2012-02-21 MED ORDER — SUCCINYLCHOLINE CHLORIDE 20 MG/ML IJ SOLN
INTRAMUSCULAR | Status: DC | PRN
  Administered 2012-02-21: 80 mg via INTRAVENOUS

## 2012-02-21 MED ORDER — SODIUM CHLORIDE 0.9 % IV SOLN
1.0000 g | INTRAVENOUS | Status: DC
  Filled 2012-02-21: qty 1

## 2012-02-21 MED ORDER — OXYCODONE-ACETAMINOPHEN 5-325 MG PO TABS: 1.0000 | ORAL_TABLET | ORAL | Status: DC | PRN

## 2012-02-21 MED ORDER — ONDANSETRON HCL 4 MG PO TABS: 4.0000 mg | ORAL_TABLET | Freq: Four times a day (QID) | ORAL | Status: DC | PRN

## 2012-02-21 MED ORDER — OXYCODONE-ACETAMINOPHEN 5-325 MG PO TABS: 1.0000 | ORAL_TABLET | ORAL | Status: AC | PRN

## 2012-02-21 MED ORDER — PROPOFOL 10 MG/ML IV EMUL
INTRAVENOUS | Status: DC | PRN
  Administered 2012-02-21: 180 mg via INTRAVENOUS

## 2012-02-21 MED ORDER — ONDANSETRON HCL 4 MG/2ML IJ SOLN: 4.0000 mg | Freq: Four times a day (QID) | INTRAMUSCULAR | Status: DC | PRN

## 2012-02-21 MED ORDER — DROPERIDOL 2.5 MG/ML IJ SOLN
INTRAMUSCULAR | Status: DC | PRN
  Administered 2012-02-21: 0.625 mg via INTRAVENOUS

## 2012-02-21 SURGICAL SUPPLY — 40 items
APPLIER CLIP ROT 10 11.4 M/L (STAPLE)
BLADE SURG ROTATE 9660 (MISCELLANEOUS) IMPLANT
CANISTER SUCTION 2500CC (MISCELLANEOUS) ×2 IMPLANT
CHLORAPREP W/TINT 26ML (MISCELLANEOUS) ×2 IMPLANT
CLIP APPLIE ROT 10 11.4 M/L (STAPLE) IMPLANT
CLOTH BEACON ORANGE TIMEOUT ST (SAFETY) ×2 IMPLANT
COVER SURGICAL LIGHT HANDLE (MISCELLANEOUS) ×2 IMPLANT
CUTTER LINEAR ENDO 35 ETS (STAPLE) ×2 IMPLANT
CUTTER LINEAR ENDO 35 ETS TH (STAPLE) IMPLANT
DECANTER SPIKE VIAL GLASS SM (MISCELLANEOUS) ×2 IMPLANT
DERMABOND ADVANCED (GAUZE/BANDAGES/DRESSINGS) ×1
DERMABOND ADVANCED .7 DNX12 (GAUZE/BANDAGES/DRESSINGS) ×1 IMPLANT
DRAPE UTILITY 15X26 W/TAPE STR (DRAPE) ×4 IMPLANT
ELECT REM PT RETURN 9FT ADLT (ELECTROSURGICAL) ×2
ELECTRODE REM PT RTRN 9FT ADLT (ELECTROSURGICAL) ×1 IMPLANT
ENDOLOOP SUT PDS II  0 18 (SUTURE)
ENDOLOOP SUT PDS II 0 18 (SUTURE) IMPLANT
GLOVE BIOGEL PI IND STRL 8 (GLOVE) ×1 IMPLANT
GLOVE BIOGEL PI INDICATOR 8 (GLOVE) ×1
GLOVE ECLIPSE 7.5 STRL STRAW (GLOVE) ×2 IMPLANT
GOWN STRL NON-REIN LRG LVL3 (GOWN DISPOSABLE) ×4 IMPLANT
KIT BASIN OR (CUSTOM PROCEDURE TRAY) ×2 IMPLANT
KIT ROOM TURNOVER OR (KITS) ×2 IMPLANT
NS IRRIG 1000ML POUR BTL (IV SOLUTION) ×2 IMPLANT
PAD ARMBOARD 7.5X6 YLW CONV (MISCELLANEOUS) ×4 IMPLANT
PENCIL BUTTON HOLSTER BLD 10FT (ELECTRODE) IMPLANT
POUCH SPECIMEN RETRIEVAL 10MM (ENDOMECHANICALS) ×2 IMPLANT
RELOAD /EVU35 (ENDOMECHANICALS) ×2 IMPLANT
RELOAD CUTTER ETS 35MM STAND (ENDOMECHANICALS) IMPLANT
SET IRRIG TUBING LAPAROSCOPIC (IRRIGATION / IRRIGATOR) ×2 IMPLANT
SPECIMEN JAR SMALL (MISCELLANEOUS) ×2 IMPLANT
SUT MNCRL AB 4-0 PS2 18 (SUTURE) ×2 IMPLANT
TOWEL OR 17X24 6PK STRL BLUE (TOWEL DISPOSABLE) ×2 IMPLANT
TOWEL OR 17X26 10 PK STRL BLUE (TOWEL DISPOSABLE) ×2 IMPLANT
TRAY FOLEY CATH 14FR (SET/KITS/TRAYS/PACK) ×2 IMPLANT
TRAY LAPAROSCOPIC (CUSTOM PROCEDURE TRAY) ×2 IMPLANT
TROCAR XCEL 12X100 BLDLESS (ENDOMECHANICALS) ×2 IMPLANT
TROCAR XCEL BLUNT TIP 100MML (ENDOMECHANICALS) ×2 IMPLANT
TROCAR XCEL NON-BLD 5MMX100MML (ENDOMECHANICALS) ×2 IMPLANT
WATER STERILE IRR 1000ML POUR (IV SOLUTION) IMPLANT

## 2012-02-21 NOTE — Discharge Summary (Signed)
Doing well. Home. Follow up with Dr. Lindie Spruce

## 2012-02-21 NOTE — ED Notes (Signed)
Dr. Ignacia Palma given pre-op EKG.  No old in MUSE.  Extra copy placed in pt chart

## 2012-02-21 NOTE — Progress Notes (Signed)
Pt is a 71 year old man sent from the [facility]gent [facility] for further evaluation and imaging for abdominal pain.  Lab tests showed WBC elevated at 20,000.  Currently in scanner. 12:34 AM CT of abdomen/pelvis shows acute appendicitis.  Call to general surgery to admit pt.  He has not eaten since last night.

## 2012-02-21 NOTE — Discharge Instructions (Signed)
[facility] ______[facility] °LAPAROSCOPIC SURGERY: POST OP INSTRUCTIONS °Always review your discharge instruction sheet given to you by the facility where your surgery was performed. °IF YOU HAVE DISABILITY OR FAMILY LEAVE FORMS, YOU MUST BRING THEM TO THE OFFICE FOR PROCESSING.   °DO NOT GIVE THEM TO YOUR DOCTOR. ° °1. A prescription for pain medication may be given to you upon discharge.  Take your pain medication as prescribed, if needed.  If narcotic pain medicine is not needed, then you may take acetaminophen (Tylenol) or ibuprofen (Advil) as needed. °2. Take your usually prescribed medications unless otherwise directed. °3. If you need a refill on your pain medication, please contact your pharmacy.  They will contact our office to request authorization. Prescriptions will not be filled after 5pm or on week-ends. °4. You should follow a light diet the first few days after arrival home, such as soup and crackers, etc.  Be sure to include lots of fluids daily. °5. Most patients will experience some swelling and bruising in the area of the incisions.  Ice packs will help.  Swelling and bruising can take several days to resolve.  °6. It is common to experience some constipation if taking pain medication after surgery.  Increasing fluid intake and taking a stool softener (such as Colace) will usually help or prevent this problem from occurring.  A mild laxative (Milk of Magnesia or Miralax) should be taken according to package instructions if there are no bowel movements after 48 hours. °7. Unless discharge instructions indicate otherwise, you may remove your bandages 24-48 hours after surgery, and you may shower at that time.  You may have steri-strips (small skin tapes) in place directly over the incision.  These strips should be left on the skin for 7-10 days.  If your surgeon used skin glue on the incision, you may shower in 24 hours.  The glue will flake off over the next 2-3 weeks.  Any sutures or  staples will be removed at the office during your follow-up visit. °8. ACTIVITIES:  You may resume regular (light) daily activities beginning the next day--such as daily self-care, walking, climbing stairs--gradually increasing activities as tolerated.  You may have sexual intercourse when it is comfortable.  Refrain from any heavy lifting or straining until approved by your doctor. °a. You may drive when you are no longer taking prescription pain medication, you can comfortably wear a seatbelt, and you can safely maneuver your car and apply brakes. °b. RETURN TO WORK:  __________________________________________________________ °9. You should see your doctor in the office for a follow-up appointment approximately 2-3 weeks after your surgery.  Make sure that you call for this appointment within a day or two after you arrive home to insure a convenient appointment time. °10. OTHER INSTRUCTIONS: __________________________________________________________________________________________________________________________ __________________________________________________________________________________________________________________________ °WHEN TO CALL YOUR DOCTOR: °1. Fever over 101.0 °2. Inability to urinate °3. Continued bleeding from incision. °4. Increased pain, redness, or drainage from the incision. °5. Increasing abdominal pain ° °The clinic staff is available to answer your questions during regular business hours.  Please don’t hesitate to call and ask to speak to one of the nurses for clinical concerns.  If you have a medical emergency, go to the nearest emergency room or call 911.  A surgeon from [facility] is always on call at the hospital. °[address], [address], [place], [place]  [address] ? [address] [address], [place], [place]   [address] °[phone] ? [phone] ? FAX [phone] °Web site:   [email or web address] °

## 2012-02-21 NOTE — Anesthesia Preprocedure Evaluation (Signed)
Anesthesia Evaluation  Patient identified by MRN, date of birth, ID band Patient awake    Reviewed: Allergy & Precautions, H&P , NPO status , Patient's Chart, lab work & pertinent test results  Airway Mallampati: I TM Distance: >3 FB Neck ROM: full    Dental   Pulmonary          Cardiovascular hypertension, Rhythm:regular Rate:Normal     Neuro/Psych    GI/Hepatic   Endo/Other    Renal/GU      Musculoskeletal   Abdominal   Peds  Hematology   Anesthesia Other Findings   Reproductive/Obstetrics                           Anesthesia Physical Anesthesia Plan  ASA: II  Anesthesia Plan: General   Post-op Pain Management:    Induction: Intravenous  Airway Management Planned: Oral ETT  Additional Equipment:   Intra-op Plan:   Post-operative Plan:   Informed Consent: I have reviewed the patients History and Physical, chart, labs and discussed the procedure including the risks, benefits and alternatives for the proposed anesthesia with the patient or authorized representative who has indicated his/her understanding and acceptance.     Plan Discussed with: CRNA, Anesthesiologist and Surgeon  Anesthesia Plan Comments:         Anesthesia Quick Evaluation

## 2012-02-21 NOTE — ED Notes (Signed)
Clothing and gold tone ring given to family members

## 2012-02-21 NOTE — Discharge Summary (Signed)
Patient ID: Saketh Daubert MRN: [id number] DOB/AGE: 70-Dec-1942 70 y.o.  Admit date: 02/20/2012 Discharge date: 02/21/2012  Procedures: lap appy  Consults: None  Reason for Admission: this is a 71 yo [place] male who presented to the [facility] with c/o RLQ abdominal pain.  He was found to have acute appendicitis.  Admission Diagnoses:  1. Acute appendicitis  Hospital Course: The patient was admitted and taken to the operating room where he underwent a lap appy.  He tolerated the procedure well.  On POD# 0, he was tolerating a regular diet and pain was well controlled.  He was felt stable for discharge home.  PE: Abd: soft, minimally tender, incisions c/d/i except umbilical incision has a little bit of old bloody drainage.  +BS  Discharge Diagnoses:  1. Acute appendicitis, s/p lap appy  Discharge Medications: Medication List  As of 02/21/2012  9:57 AM   TAKE these medications         lisinopril-hydrochlorothiazide 20-12.5 MG per tablet   Commonly known as: PRINZIDE,ZESTORETIC   Take 2 tablets by mouth daily.      oxyCODONE-acetaminophen 5-325 MG per tablet   Commonly known as: PERCOCET   Take 1-2 tablets by mouth every 4 (four) hours as needed.            Discharge Instructions: Follow-up Information    Follow up with WYATT Rene Kocher, MD in 3 weeks.   Contact information:   [facility] [address] [place] [address] [phone]          Signed: Letha Cape 02/21/2012, 9:57 AM

## 2012-02-21 NOTE — Progress Notes (Signed)
1:26 AM  Date: 02/21/2012  Rate: 78  Rhythm: normal sinus rhythm  QRS Axis: normal  Intervals: normal  ST/T Wave abnormalities: normal  Conduction Disutrbances:none  Narrative Interpretation: Normal EKG  Old EKG Reviewed: none available

## 2012-02-21 NOTE — Op Note (Signed)
OPERATIVE REPORT  DATE OF OPERATION: 02/20/2012 - 02/21/2012  PATIENT:  [name]  71 y.o. male  PRE-OPERATIVE DIAGNOSIS:  Acute appendicitis [540]  POST-OPERATIVE DIAGNOSIS:  acute appendicitis  PROCEDURE:  Procedure(s): APPENDECTOMY LAPAROSCOPIC  SURGEON:  Surgeon(s): Cherylynn Ridges, MD  ASSISTANT: None  ANESTHESIA:   general  EBL: <20 ml  BLOOD ADMINISTERED: none  DRAINS: none   SPECIMEN:  Source of Specimen:  appendix  COUNTS CORRECT:  YES  PROCEDURE DETAILS:  The patient was taken to the operating room and placed on the table in the supine position. After an adequate general endotracheal anesthetic was administered he was prepped and draped in usual sterile manner exposing his entire abdomen.   After proper time out was performed identifying the patient and the procedure to be performed a supraumbilical midline incision was made using a #15 blade and taken down to the midline fascia. We incised the midline fascia with a 15 blade. The edges of the fascial grabbed with a Kocher clamp and then we bluntly dissected down into the peritoneal cavity with the Kelly clamp. Once this was performed a fascial stitch of 0 Vicryl was passed around the fascial opening. A Hassan cannula was then passed into the peritoneal cavity and secured in place with the pursestring 0 Vicryl. Carbon dioxide gas was insufflated through the Hassan cannula up to a maximal intra-abdominal pressure 15 mm of mercury.   Once this was performed a right upper quadrant 5 mm cannula and the left lower quadrant 12 mm cannula were passed under direct vision.  The appendix was somewhat tethered to the right lower quadrant and the lateral aspect of the abdominal wall by adhesions. The terminal ileum was draped over the mesoappendix and the distal portion of the cecum. After dissecting it free we able to pass a 2.5 mm white Endo GIA across the base of the mesoappendix. This detached appendix from the mesentery. We then  used a 3.5 mm white Endo GIA across the base of the appendix detaching it completely. I removed the appendix from the left lower quadrant using an Endo Catch bag.   Once the bleeding was controlled we irrigated with about 600cc of saline to make sure all the old blood had washed away and there was no acute bleeding continuing. Once this was completed we removed the [facility] cannula and tied off the fascial opening using the stitch that was in place. We aspirated all fluid and gas from above the liver and removed all cannulas.  All counts were correct at that point including needle sponges and instruments. Cortisone Marcaine was injected at all sites. This was combined with epinephrine. The left lower quadrant in the supraumbilical skin sites were closed using a running 4-0 Monocryl suture. Dermabond Steri-Strips and Tegaderms use complete the dressings   PATIENT DISPOSITION:  PACU - hemodynamically stable.   [name],[name] 5/30/20133:15 AM

## 2012-02-21 NOTE — Progress Notes (Signed)
Discharge instructions reviewed with pt via [employer] and prescription given.  Pt verbalized understanding and had no questions.  Pt discharged in stable condition with family and neighbor.  Hector [place]

## 2012-02-21 NOTE — Transfer of Care (Signed)
Immediate Anesthesia Transfer of Care Note  Patient: [name]  Procedure(s) Performed: Procedure(s) (LRB): APPENDECTOMY LAPAROSCOPIC (N/A)  Patient Location: PACU  Anesthesia Type: General  Level of Consciousness: oriented, sedated, patient cooperative and responds to stimulation  Airway & Oxygen Therapy: Patient Spontanous Breathing and Patient connected to nasal cannula oxygen  Post-op Assessment: Report given to PACU RN, Post -op Vital signs reviewed and stable and Patient moving all extremities  Post vital signs: Reviewed and stable  Complications: No apparent anesthesia complications

## 2012-02-21 NOTE — H&P (Signed)
[name] is an 71 y.o. male.   Chief Complaint: Abdominal pain HPI: 24 hours of abdominal pain now localized to RLQ.  Went to Urgent Care for shoulder pain, their examination suspicious for appendicitis.  Sent to ED where CT done demonstrating acute appendicitis.  Reason for shoulder pain is unknown.  Past Medical History  Diagnosis Date  . Hypertension     History reviewed. No pertinent past surgical history.  History reviewed. No pertinent family history. Social History:  reports that he has never smoked. He does not have any smokeless tobacco history on file. He reports that he does not drink alcohol or use illicit drugs.  Allergies: No Known Allergies   (Not in a hospital admission)  Results for orders placed during the hospital encounter of 02/20/12 (from the past 48 hour(s))  URINALYSIS, ROUTINE W REFLEX MICROSCOPIC     Status: Abnormal   Collection Time   02/20/12  7:44 PM      Component Value Range Comment   Color, Urine AMBER (*) YELLOW  BIOCHEMICALS MAY BE AFFECTED BY COLOR   APPearance CLEAR  CLEAR     Specific Gravity, Urine 1.010  1.005 - 1.030     pH 6.0  5.0 - 8.0     Glucose, UA NEGATIVE  NEGATIVE (mg/dL)    Hgb urine dipstick SMALL (*) NEGATIVE     Bilirubin Urine SMALL (*) NEGATIVE     Ketones, ur 15 (*) NEGATIVE (mg/dL)    Protein, ur NEGATIVE  NEGATIVE (mg/dL)    Urobilinogen, UA 1.0  0.0 - 1.0 (mg/dL)    Nitrite NEGATIVE  NEGATIVE     Leukocytes, UA TRACE (*) NEGATIVE    URINE MICROSCOPIC-ADD ON     Status: Normal   Collection Time   02/20/12  7:44 PM      Component Value Range Comment   Squamous Epithelial / LPF RARE  RARE     WBC, UA 0-2  <3 (WBC/hpf)    RBC / HPF 0-2  <3 (RBC/hpf)    Bacteria, UA RARE  RARE    CBC     Status: Abnormal   Collection Time   02/20/12  8:00 PM      Component Value Range Comment   WBC 20.6 (*) 4.0 - 10.5 (K/uL)    RBC 5.08  4.22 - 5.81 (MIL/uL)    Hemoglobin 17.3 (*) 13.0 - 17.0 (g/dL)    HCT 29.5  62.1 - 30.8 (%)     MCV 93.5  78.0 - 100.0 (fL)    MCH 34.1 (*) 26.0 - 34.0 (pg)    MCHC 36.4 (*) 30.0 - 36.0 (g/dL)    RDW 65.7  84.6 - 96.2 (%)    Platelets 187  150 - 400 (K/uL)   DIFFERENTIAL     Status: Abnormal   Collection Time   02/20/12  8:00 PM      Component Value Range Comment   Neutrophils Relative 77  43 - 77 (%)    Neutro Abs 15.9 (*) 1.7 - 7.7 (K/uL)    Lymphocytes Relative 10 (*) 12 - 46 (%)    Lymphs Abs 2.2  0.7 - 4.0 (K/uL)    Monocytes Relative 12  3 - 12 (%)    Monocytes Absolute 2.4 (*) 0.1 - 1.0 (K/uL)    Eosinophils Relative 0  0 - 5 (%)    Eosinophils Absolute 0.1  0.0 - 0.7 (K/uL)    Basophils Relative 0  0 - 1 (%)  Basophils Absolute 0.0  0.0 - 0.1 (K/uL)   POCT I-STAT, CHEM 8     Status: Abnormal   Collection Time   02/20/12  8:24 PM      Component Value Range Comment   Sodium 134 (*) 135 - 145 (mEq/L)    Potassium 4.7  3.5 - 5.1 (mEq/L)    Chloride 97  96 - 112 (mEq/L)    BUN 10  6 - 23 (mg/dL)    Creatinine, Ser 1.02  0.50 - 1.35 (mg/dL)    Glucose, Bld 725 (*) 70 - 99 (mg/dL)    Calcium, Ion 3.66  1.12 - 1.32 (mmol/L)    TCO2 28  0 - 100 (mmol/L)    Hemoglobin 18.0 (*) 13.0 - 17.0 (g/dL)    HCT 44.0 (*) 34.7 - 52.0 (%)    Ct Abdomen Pelvis W Contrast  02/21/2012  *RADIOLOGY REPORT*  Clinical Data: Right lower quadrant pain, nausea and vomiting.  CT ABDOMEN AND PELVIS WITH CONTRAST  Technique:  Multidetector CT imaging of the abdomen and pelvis was performed following the standard protocol during bolus administration of intravenous contrast.  Contrast: 80mL OMNIPAQUE IOHEXOL 300 MG/ML  SOLN  Comparison: 04/20/2010  Findings: Atelectasis or infiltration in the lung bases.  Diffuse low attenuation change throughout the liver consistent with fatty infiltration.  The gallbladder, spleen, pancreas, adrenal glands, kidneys, and retroperitoneal lymph nodes are unremarkable. Calcification of the abdominal aorta without aneurysm.  The stomach and small bowel are not dilated.   Predominately gas filled colon without distension or wall thickening.  Pelvis:  The appendix is distended and fluid-filled with an enhancing wall, measuring 12 mm diameter.  There is periappendiceal infiltration.  The changes are consistent with acute appendicitis. Mild enlargement prostate gland, measuring 5.4 x 4 cm.  No bladder wall thickening.  No free or loculated pelvic fluid collections. No significant pelvic lymphadenopathy.  Degenerative changes in the lumbar spine.  IMPRESSION: Distended fluid filled appendix with periappendiceal inflammation consistent with acute appendicitis.  No focal abscess.  Original Report Authenticated By: Marlon Pel, M.D.    Review of Systems  Constitutional: Negative.   HENT: Negative.   Eyes: Negative.   Respiratory: Negative.   Cardiovascular: Negative.   Gastrointestinal: Positive for abdominal pain.  Genitourinary: Negative.   Musculoskeletal: Positive for joint pain (bilateral shoulder pain.).  Skin: Negative.   Neurological: Negative.   Endo/Heme/Allergies: Negative.   Psychiatric/Behavioral: Negative.     Blood pressure 102/70, pulse 112, temperature 98 F (36.7 C), temperature source Oral, resp. rate 16, SpO2 96.00%. Physical Exam  Constitutional: He is oriented to person, place, and time. He appears well-developed and well-nourished.  HENT:  Head: Normocephalic and atraumatic.  Eyes: EOM are normal. Pupils are equal, round, and reactive to light.  Neck: Normal range of motion. Neck supple.  Cardiovascular: Normal rate, regular rhythm, normal heart sounds and intact distal pulses.   Respiratory: Effort normal and breath sounds normal.  GI: Soft. Normal appearance. Bowel sounds are decreased. There is tenderness in the right lower quadrant. There is guarding and tenderness at McBurney's point.  Musculoskeletal: Normal range of motion.  Neurological: He is alert and oriented to person, place, and time. He has normal reflexes.  Skin:  Skin is warm and dry.  Psychiatric:       Speaks no English     Assessment/Plan Acute appendicitis without rupture  Invanz given by EDP. To OR ASAP for Lap Appy.  Risks and benefits discussed with patient through  his family who interpreted for him.  He understands the need for surgical intervention and wishes to proceed.  Evin Loiseau III,Cougar Imel O 02/21/2012, 1:20 AM

## 2012-02-21 NOTE — Anesthesia Postprocedure Evaluation (Signed)
  Anesthesia Post-op Note  Patient: [name]  Procedure(s) Performed: Procedure(s) (LRB): APPENDECTOMY LAPAROSCOPIC (N/A)  Patient Location: PACU  Anesthesia Type: General  Level of Consciousness: awake, alert  and patient cooperative  Airway and Oxygen Therapy: Patient Spontanous Breathing and Patient connected to nasal cannula oxygen  Post-op Pain: mild  Post-op Assessment: Post-op Vital signs reviewed, Patient's Cardiovascular Status Stable, Respiratory Function Stable, Patent Airway, No signs of Nausea or vomiting and Pain level controlled  Post-op Vital Signs: stable  Complications: No apparent anesthesia complications

## 2012-02-22 ENCOUNTER — Encounter (HOSPITAL_COMMUNITY): Payer: Self-pay | Admitting: General Surgery

## 2012-02-22 NOTE — ED Provider Notes (Signed)
Medical screening examination/treatment/procedure(s) were conducted as a shared visit with non-physician practitioner(s) and myself.  I personally evaluated the patient during the encounter Pt is a 71 year old man sent from the Urgent Care Center for further evaluation and imaging for abdominal pain. Lab tests showed WBC elevated at 20,000. Currently in scanner.  12:34 AM  CT of abdomen/pelvis shows acute appendicitis. Call to general surgery to admit pt. He has not eaten since last night.      Carleene Cooper III, MD 02/22/12 1048

## 2012-02-26 ENCOUNTER — Ambulatory Visit: Admit: 2012-02-26 | Payer: Self-pay | Admitting: General Surgery

## 2012-02-26 SURGERY — APPENDECTOMY, LAPAROSCOPIC
Anesthesia: General

## 2012-03-11 ENCOUNTER — Ambulatory Visit (INDEPENDENT_AMBULATORY_CARE_PROVIDER_SITE_OTHER): Payer: Medicaid Other | Admitting: General Surgery

## 2012-03-11 ENCOUNTER — Encounter (INDEPENDENT_AMBULATORY_CARE_PROVIDER_SITE_OTHER): Payer: Self-pay | Admitting: General Surgery

## 2012-03-11 VITALS — BP 120/82 | HR 72 | Resp 14 | Ht 63.0 in | Wt 155.2 lb

## 2012-03-11 DIAGNOSIS — Z09 Encounter for follow-up examination after completed treatment for conditions other than malignant neoplasm: Secondary | ICD-10-CM

## 2012-03-11 NOTE — Progress Notes (Signed)
HPI The patient has done well status post laparoscopic appendectomy.  PE On examination his wounds are healing well with no evidence of infection. He has normal good bowel sounds.  Studiy review None.  Assessment  Status post laparoscopic appendectomy.  Plan Return to see me on a p.r.n. basis.

## 2013-06-10 ENCOUNTER — Other Ambulatory Visit: Payer: Self-pay | Admitting: Internal Medicine

## 2013-06-16 ENCOUNTER — Ambulatory Visit
Admission: RE | Admit: 2013-06-16 | Discharge: 2013-06-16 | Disposition: A | Discharge status: Home / Self Care | Payer: Medicaid Other | Source: Ambulatory Visit | Attending: Internal Medicine | Admitting: Internal Medicine

## 2015-11-16 ENCOUNTER — Encounter: Payer: Self-pay | Admitting: Internal Medicine

## 2017-03-26 ENCOUNTER — Encounter (HOSPITAL_COMMUNITY): Payer: Self-pay

## 2017-03-26 ENCOUNTER — Observation Stay (HOSPITAL_COMMUNITY)
Admission: EM | Admit: 2017-03-26 | Discharge: 2017-03-28 | Disposition: A | Discharge status: Home / Self Care | Payer: Medicare Other | Attending: Family Medicine | Admitting: Family Medicine

## 2017-03-26 ENCOUNTER — Emergency Department (HOSPITAL_COMMUNITY): Payer: Medicare Other

## 2017-03-26 DIAGNOSIS — I63511 Cerebral infarction due to unspecified occlusion or stenosis of right middle cerebral artery: Secondary | ICD-10-CM | POA: Diagnosis not present

## 2017-03-26 DIAGNOSIS — I63533 Cerebral infarction due to unspecified occlusion or stenosis of bilateral posterior cerebral arteries: Secondary | ICD-10-CM | POA: Insufficient documentation

## 2017-03-26 DIAGNOSIS — Z79899 Other long term (current) drug therapy: Secondary | ICD-10-CM | POA: Insufficient documentation

## 2017-03-26 DIAGNOSIS — E119 Type 2 diabetes mellitus without complications: Secondary | ICD-10-CM | POA: Insufficient documentation

## 2017-03-26 DIAGNOSIS — R531 Weakness: Secondary | ICD-10-CM

## 2017-03-26 DIAGNOSIS — I651 Occlusion and stenosis of basilar artery: Secondary | ICD-10-CM | POA: Diagnosis not present

## 2017-03-26 DIAGNOSIS — I6523 Occlusion and stenosis of bilateral carotid arteries: Secondary | ICD-10-CM | POA: Insufficient documentation

## 2017-03-26 DIAGNOSIS — Z7982 Long term (current) use of aspirin: Secondary | ICD-10-CM | POA: Insufficient documentation

## 2017-03-26 DIAGNOSIS — R51 Headache: Secondary | ICD-10-CM | POA: Insufficient documentation

## 2017-03-26 DIAGNOSIS — Z7902 Long term (current) use of antithrombotics/antiplatelets: Secondary | ICD-10-CM | POA: Diagnosis not present

## 2017-03-26 DIAGNOSIS — I10 Essential (primary) hypertension: Secondary | ICD-10-CM | POA: Diagnosis not present

## 2017-03-26 DIAGNOSIS — E118 Type 2 diabetes mellitus with unspecified complications: Secondary | ICD-10-CM

## 2017-03-26 DIAGNOSIS — R42 Dizziness and giddiness: Secondary | ICD-10-CM

## 2017-03-26 DIAGNOSIS — I63512 Cerebral infarction due to unspecified occlusion or stenosis of left middle cerebral artery: Principal | ICD-10-CM | POA: Insufficient documentation

## 2017-03-26 DIAGNOSIS — R278 Other lack of coordination: Secondary | ICD-10-CM | POA: Diagnosis not present

## 2017-03-26 DIAGNOSIS — E785 Hyperlipidemia, unspecified: Secondary | ICD-10-CM | POA: Insufficient documentation

## 2017-03-26 DIAGNOSIS — I6330 Cerebral infarction due to thrombosis of unspecified cerebral artery: Secondary | ICD-10-CM | POA: Insufficient documentation

## 2017-03-26 HISTORY — DX: Dizziness and giddiness: R42

## 2017-03-26 HISTORY — DX: Type 2 diabetes mellitus without complications: E11.9

## 2017-03-26 LAB — DIFFERENTIAL
Basophils Absolute: 0.1 10*3/uL (ref 0.0–0.1)
Basophils Relative: 1 %
EOS PCT: 2 %
Eosinophils Absolute: 0.2 10*3/uL (ref 0.0–0.7)
LYMPHS ABS: 2.6 10*3/uL (ref 0.7–4.0)
LYMPHS PCT: 28 %
MONO ABS: 1.3 10*3/uL — AB (ref 0.1–1.0)
MONOS PCT: 14 %
Neutro Abs: 5.2 10*3/uL (ref 1.7–7.7)
Neutrophils Relative %: 55 %

## 2017-03-26 LAB — COMPREHENSIVE METABOLIC PANEL
ALK PHOS: 58 U/L (ref 38–126)
ALT: 36 U/L (ref 17–63)
ANION GAP: 10 (ref 5–15)
AST: 42 U/L — ABNORMAL HIGH (ref 15–41)
Albumin: 4.1 g/dL (ref 3.5–5.0)
BILIRUBIN TOTAL: 1.6 mg/dL — AB (ref 0.3–1.2)
BUN: <5 mg/dL — ABNORMAL LOW (ref 6–20)
CALCIUM: 9.8 mg/dL (ref 8.9–10.3)
CO2: 24 mmol/L (ref 22–32)
CREATININE: 0.82 mg/dL (ref 0.61–1.24)
Chloride: 102 mmol/L (ref 101–111)
Glucose, Bld: 106 mg/dL — ABNORMAL HIGH (ref 65–99)
Potassium: 3.8 mmol/L (ref 3.5–5.1)
Sodium: 136 mmol/L (ref 135–145)
TOTAL PROTEIN: 8.3 g/dL — AB (ref 6.5–8.1)

## 2017-03-26 LAB — CBC
HEMATOCRIT: 47.4 % (ref 39.0–52.0)
HEMOGLOBIN: 16.8 g/dL (ref 13.0–17.0)
MCH: 33.1 pg (ref 26.0–34.0)
MCHC: 35.4 g/dL (ref 30.0–36.0)
MCV: 93.5 fL (ref 78.0–100.0)
PLATELETS: 185 10*3/uL (ref 150–400)
RBC: 5.07 MIL/uL (ref 4.22–5.81)
RDW: 13.1 % (ref 11.5–15.5)
WBC: 9.4 10*3/uL (ref 4.0–10.5)

## 2017-03-26 LAB — I-STAT CHEM 8, ED
BUN: 6 mg/dL (ref 6–20)
CALCIUM ION: 1.21 mmol/L (ref 1.15–1.40)
Chloride: 102 mmol/L (ref 101–111)
Creatinine, Ser: 0.7 mg/dL (ref 0.61–1.24)
GLUCOSE: 105 mg/dL — AB (ref 65–99)
HCT: 51 % (ref 39.0–52.0)
Hemoglobin: 17.3 g/dL — ABNORMAL HIGH (ref 13.0–17.0)
Potassium: 3.7 mmol/L (ref 3.5–5.1)
SODIUM: 140 mmol/L (ref 135–145)
TCO2: 24 mmol/L (ref 0–100)

## 2017-03-26 LAB — I-STAT TROPONIN, ED: Troponin i, poc: 0 ng/mL (ref 0.00–0.08)

## 2017-03-26 LAB — PROTIME-INR
INR: 1.06
PROTHROMBIN TIME: 13.8 s (ref 11.4–15.2)

## 2017-03-26 LAB — APTT: aPTT: 31 seconds (ref 24–36)

## 2017-03-26 MED ORDER — ATORVASTATIN CALCIUM 80 MG PO TABS
80.0000 mg | ORAL_TABLET | Freq: Every day | ORAL | Status: DC
  Administered 2017-03-26 – 2017-03-28 (×3): 80 mg via ORAL
  Filled 2017-03-26 (×4): qty 1

## 2017-03-26 MED ORDER — ALBUTEROL SULFATE (2.5 MG/3ML) 0.083% IN NEBU: 3.0000 mL | INHALATION_SOLUTION | Freq: Four times a day (QID) | RESPIRATORY_TRACT | Status: DC | PRN

## 2017-03-26 MED ORDER — ACETAMINOPHEN 325 MG PO TABS: 650.0000 mg | ORAL_TABLET | ORAL | Status: DC | PRN

## 2017-03-26 MED ORDER — INSULIN ASPART 100 UNIT/ML ~~LOC~~ SOLN
0.0000 [IU] | Freq: Three times a day (TID) | SUBCUTANEOUS | Status: DC
  Administered 2017-03-27: 1 [IU] via SUBCUTANEOUS

## 2017-03-26 MED ORDER — ACETAMINOPHEN 160 MG/5ML PO SOLN: 650.0000 mg | ORAL | Status: DC | PRN

## 2017-03-26 MED ORDER — LOSARTAN POTASSIUM-HCTZ 100-12.5 MG PO TABS: 1.0000 | ORAL_TABLET | Freq: Every day | ORAL | Status: DC

## 2017-03-26 MED ORDER — STROKE: EARLY STAGES OF RECOVERY BOOK
Freq: Once | Status: AC
  Administered 2017-03-26: 1
  Filled 2017-03-26: qty 1

## 2017-03-26 MED ORDER — HYDROCHLOROTHIAZIDE 12.5 MG PO CAPS
12.5000 mg | ORAL_CAPSULE | Freq: Every day | ORAL | Status: DC
  Administered 2017-03-27 – 2017-03-28 (×2): 12.5 mg via ORAL
  Filled 2017-03-26 (×2): qty 1

## 2017-03-26 MED ORDER — ACETAMINOPHEN 650 MG RE SUPP: 650.0000 mg | RECTAL | Status: DC | PRN

## 2017-03-26 MED ORDER — HYDRALAZINE HCL 20 MG/ML IJ SOLN
10.0000 mg | Freq: Four times a day (QID) | INTRAMUSCULAR | Status: DC | PRN
  Administered 2017-03-26: 10 mg via INTRAVENOUS
  Filled 2017-03-26: qty 1

## 2017-03-26 MED ORDER — MECLIZINE HCL 12.5 MG PO TABS: 12.5000 mg | ORAL_TABLET | Freq: Three times a day (TID) | ORAL | Status: DC | PRN

## 2017-03-26 MED ORDER — INSULIN ASPART 100 UNIT/ML ~~LOC~~ SOLN: 0.0000 [IU] | Freq: Every day | SUBCUTANEOUS | Status: DC

## 2017-03-26 MED ORDER — METFORMIN HCL 500 MG PO TABS
500.0000 mg | ORAL_TABLET | Freq: Two times a day (BID) | ORAL | Status: DC
  Administered 2017-03-27 – 2017-03-28 (×4): 500 mg via ORAL
  Filled 2017-03-26 (×4): qty 1

## 2017-03-26 MED ORDER — LOSARTAN POTASSIUM 50 MG PO TABS
100.0000 mg | ORAL_TABLET | Freq: Every day | ORAL | Status: DC
  Administered 2017-03-27 – 2017-03-28 (×2): 100 mg via ORAL
  Filled 2017-03-26 (×2): qty 2

## 2017-03-26 MED ORDER — SODIUM CHLORIDE 0.9 % IV SOLN
INTRAVENOUS | Status: AC
  Administered 2017-03-26: 23:00:00 via INTRAVENOUS

## 2017-03-26 MED ORDER — CLOPIDOGREL BISULFATE 75 MG PO TABS
75.0000 mg | ORAL_TABLET | Freq: Every day | ORAL | Status: DC
  Administered 2017-03-27 – 2017-03-28 (×2): 75 mg via ORAL
  Filled 2017-03-26 (×2): qty 1

## 2017-03-26 MED ORDER — SENNOSIDES-DOCUSATE SODIUM 8.6-50 MG PO TABS: 1.0000 | ORAL_TABLET | Freq: Every evening | ORAL | Status: DC | PRN

## 2017-03-26 MED ORDER — GLIMEPIRIDE 4 MG PO TABS
4.0000 mg | ORAL_TABLET | Freq: Every day | ORAL | Status: DC
  Administered 2017-03-27 – 2017-03-28 (×2): 4 mg via ORAL
  Filled 2017-03-26 (×2): qty 1

## 2017-03-26 NOTE — ED Notes (Signed)
Pt states that when he walks when is when he get dizziness. No co pain or discomfort.

## 2017-03-26 NOTE — H&P (Signed)
TRH H&P   Patient Demographics:    [name], is a 76 y.o. male  MRN: [id number]   DOB - 12/16/1940  Admit Date - 03/26/2017  Outpatient Primary MD for the patient is Nolene Ebbs, MD  Referring MD/NP/PA:  Dr. Rex Kras  Outpatient Specialists:     Patient coming from: home  Chief Complaint  Patient presents with  . Dizziness  . Numbness      HPI:    [name]  is a 76 y.o. male, w hypertension, dm2, apparently c/o dizziness starting last nite.  + vertigo.  This morning pt c/o difficulty with walking , and weakness in the left hand and left leg. Numbness, tingling in the left arm and left leg.    Pt came to ED for evaluation.  Pt had CT brain negative.  ED requested admission by medicine for evaluation of possible stroke.      Review of systems:    In addition to the HPI above,  No Fever-chills, No Headache, No changes with Vision or hearing, No problems swallowing food or Liquids, No Chest pain, Cough or Shortness of Breath, No Abdominal pain, No Nausea or Vommitting, Bowel movements are regular, No Blood in stool or Urine, No dysuria, No new skin rashes or bruises, No new joints pains-aches,   No recent weight gain or loss, No polyuria, polydypsia or polyphagia, No significant Mental Stressors.  A full 10 point Review of Systems was done, except as stated above, all other Review of Systems were negative.   With Past History of the following :    Past Medical History:  Diagnosis Date  . Diabetes ([place])   . Dizziness   . Hypertension       Past Surgical History:  Procedure Laterality Date  . LAPAROSCOPIC APPENDECTOMY  02/21/2012   Procedure: APPENDECTOMY LAPAROSCOPIC;  Surgeon: Gwenyth Ober, MD;  Location: [facility] OR;  Service: General;  Laterality: N/A;      Social History:     Social History  Substance Use Topics  . Smoking status: Never  Smoker  . Smokeless tobacco: Never Used  . Alcohol use No     Lives - at home  Mobility -   Walks typically without assistance   Family History :     Family History  Problem Relation Age of Onset  . Diabetes Neg Hx   . Stroke Neg Hx   . Cancer Neg Hx       Home Medications:   Prior to Admission medications   Medication Sig Start Date End Date Taking? Authorizing Provider  albuterol (PROVENTIL HFA;VENTOLIN HFA) 108 (90 Base) MCG/ACT inhaler Inhale 2 puffs into the lungs every 6 (six) hours as needed for wheezing or shortness of breath.   Yes [provider]  aspirin EC 81 MG tablet Take 81 mg by mouth daily.   Yes [provider]  glimepiride (AMARYL) 4 MG tablet Take 4 mg by mouth daily with breakfast.   Yes [provider]  loratadine (CLARITIN) 10 MG tablet  12/10/11  Yes [provider]  losartan-hydrochlorothiazide (HYZAAR) 100-12.5 MG tablet Take 1 tablet by mouth daily.   Yes [provider]  meclizine (ANTIVERT) 12.5 MG tablet Take 12.5 mg by mouth 3 (three) times daily as needed for dizziness.   Yes [provider]  metFORMIN (GLUCOPHAGE) 500 MG tablet Take 500 mg by mouth 2 (two) times daily with a meal.   Yes [provider]  lisinopril-hydrochlorothiazide (PRINZIDE,ZESTORETIC) 20-12.5 MG per tablet Take 2 tablets by mouth daily.    [provider]  triamcinolone cream (KENALOG) 0.1 %  12/17/11   [provider]     Allergies:    No Known Allergies   Physical Exam:   Vitals  Blood pressure (!) 161/113, pulse 76, temperature 98.3 F (36.8 C), temperature source Oral, resp. rate 19, SpO2 97 %.   1. General  lying in bed in NAD,   2. Normal affect and insight, Not Suicidal or Homicidal, Awake Alert, Oriented X 3.  3. No F.N deficits, ALL C.Nerves Intact, Strength 5/5 all 4 extremities, Sensation intact all 4 extremities, Plantars down going.  4. Ears and Eyes appear Normal,  Conjunctivae clear, PERRLA. Moist Oral Mucosa.  5. Supple Neck, No JVD, No cervical lymphadenopathy appriciated, No Carotid Bruits.  6. Symmetrical Chest wall movement, Good air movement bilaterally, CTAB.  7. RRR, No Gallops, Rubs or Murmurs, No Parasternal Heave.  8. Positive Bowel Sounds, Abdomen Soft, No tenderness, No organomegaly appriciated,No rebound -guarding or rigidity.  9.  No Cyanosis, Normal Skin Turgor, No Skin Rash or Bruise.  10. Good muscle tone,  joints appear normal , no effusions, Normal ROM.  11. No Palpable Lymph Nodes in Neck or Axillae  Good finger to nose.     Data Review:    CBC  Recent Labs Lab 03/26/17 1823 03/26/17 1846  WBC 9.4  --   HGB 16.8 17.3*  HCT 47.4 51.0  PLT 185  --   MCV 93.5  --   MCH 33.1  --   MCHC 35.4  --   RDW 13.1  --   LYMPHSABS 2.6  --   MONOABS 1.3*  --   EOSABS 0.2  --   BASOSABS 0.1  --    ------------------------------------------------------------------------------------------------------------------  Chemistries   Recent Labs Lab 03/26/17 1823 03/26/17 1846  NA 136 140  K 3.8 3.7  CL 102 102  CO2 24  --   GLUCOSE 106* 105*  BUN <5* 6  CREATININE 0.82 0.70  CALCIUM 9.8  --   AST 42*  --   ALT 36  --   ALKPHOS 58  --   BILITOT 1.6*  --    ------------------------------------------------------------------------------------------------------------------ CrCl cannot be calculated (Unknown ideal weight.). ------------------------------------------------------------------------------------------------------------------ No results for input(s): TSH, T4TOTAL, T3FREE, THYROIDAB in the last 72 hours.  Invalid input(s): FREET3  Coagulation profile  Recent Labs Lab 03/26/17 1823  INR 1.06   ------------------------------------------------------------------------------------------------------------------- No results for input(s): DDIMER in the last 72  hours. -------------------------------------------------------------------------------------------------------------------  Cardiac Enzymes No results for input(s): CKMB, TROPONINI, MYOGLOBIN in the last 168 hours.  Invalid input(s): CK ------------------------------------------------------------------------------------------------------------------ No results found for: BNP   ---------------------------------------------------------------------------------------------------------------  Urinalysis    Component Value Date/Time   COLORURINE AMBER (A) 02/20/2012 1944   APPEARANCEUR CLEAR 02/20/2012 1944   LABSPEC 1.010 02/20/2012 1944   PHURINE 6.0 02/20/2012 1944  GLUCOSEU NEGATIVE 02/20/2012 1944   HGBUR SMALL (A) 02/20/2012 1944   HGBUR negative 08/28/2010 0959   BILIRUBINUR SMALL (A) 02/20/2012 1944   KETONESUR 15 (A) 02/20/2012 1944   PROTEINUR NEGATIVE 02/20/2012 1944   UROBILINOGEN 1.0 02/20/2012 1944   NITRITE NEGATIVE 02/20/2012 1944   LEUKOCYTESUR TRACE (A) 02/20/2012 1944    ----------------------------------------------------------------------------------------------------------------   Imaging Results:    Ct Head Wo Contrast  Result Date: 03/26/2017 CLINICAL DATA:  76 year old hypertensive male with dizziness since last night. Bilateral arm and leg numbness. Facial droop this morning. EXAM: CT HEAD WITHOUT CONTRAST TECHNIQUE: Contiguous axial images were obtained from the base of the skull through the vertex without intravenous contrast. COMPARISON:  None. FINDINGS: Brain: No intracranial hemorrhage. Slightly hyperdense left middle cerebral artery M2 branch possibly related to acute thrombosis versus atherosclerotic changes. No other CT evidence of large acute infarct. Prominent chronic microvascular changes. This limits evaluation for detection of small or moderate-size infarct. Global atrophy. No intracranial mass lesion noted on this unenhanced exam. Vascular:  Vascular calcifications in addition to above findings. Skull: No acute abnormality. Sinuses/Orbits: No acute orbital abnormality. Minimal mucosal thickening right maxillary sinus and ethmoid sinus air cells. Other: Mastoid air cells and middle ear cavities are clear. IMPRESSION: No intracranial hemorrhage. Slightly hyperdense left middle cerebral artery M2 branch possibly related to acute thrombosis versus atherosclerotic changes. No other CT evidence of large acute infarct. Prominent chronic microvascular changes. This limits evaluation for detection of small or moderate-size infarct. Global atrophy. Electronically Signed   By: Genia Del M.D.   On: 03/26/2017 19:07     Assessment & Plan:    Active Problems:   HYPERTENSION, BENIGN ESSENTIAL   Diabetic complication (HCC)    Dizziness ? Vertigo Difficult to ascertain due to language barrier.  MRI brain  L sided weakness  ? tia/cva MRI brain  Check carotid ultrasound, check cardiac echo Start Plavix 72m po qday , in place of aspirin Start lipitor 852mpo qhs Check lipid, tsh, esr, ana, b12 Neurology consulted PT/OT, speech  Dm2 fsbs ac and qhs iss Continue metformin Hold amaryl  Hypertension Continue losartan/hydrochlorothiazide Allow permissive hypertension Hydralazine 1045mv q6h prn   DVT Prophylaxis SCDs  AM Labs Ordered, also please review Full Orders  Family Communication: Admission, patients condition and plan of care including tests being ordered have been discussed with the patient   who indicate understanding and agree with the plan and Code Status.  Code Status  FULL CODE  Likely DC to  home  Condition GUARDED    Consults called:   Please consult neurology in am  Admission status: observation  Time spent in minutes :45 minutes   JamJani GravelD on 03/26/2017 at 9:04 PM  Between 7am to 7pm - Pager - [phone] 7pm go to [email or web address] - password [facility] - Office   [phone]

## 2017-03-26 NOTE — ED Notes (Signed)
Attempted to call report x1 will have to call back in 5 minutes

## 2017-03-26 NOTE — ED Provider Notes (Signed)
[facility]-EMERGENCY DEPT Provider Note   CSN: [id number] Arrival date & time: 03/26/17  1812     History   Chief Complaint Chief Complaint  Patient presents with  . Dizziness  . Numbness    HPI [name] is a 76 y.o. male.  76yo M w/ PMH including HTN, T2DM who p/w weakness and dizziness. He began having left sided weakness involving L eye, arm and leg 3 days ago that worsened last night. He reports associated numbness left hand. He reports associated gradual onset of headache that began 3 days ago before weakness symptoms started. He reports associated dizziness and feeling like he is going to fall over when he walks. No associated N/V, fever. He reports mild cough. He has been taking HTN medication.    The history is provided by the patient. The history is limited by a language barrier. A language interpreter was used.    Past Medical History:  Diagnosis Date  . Hypertension     Patient Active Problem List   Diagnosis Date Noted  . Postop check 03/11/2012  . HYDROCELE, RIGHT 08/28/2010  . NOCTURIA 08/28/2010  . OTH&UNSPEC NONINFECTIOUS GASTROENTERITIS&COLITIS 06/29/2010  . ABDOMINAL PAIN-RLQ 06/29/2010  . ABNORMAL FINDINGS GI TRACT 06/29/2010  . HYPERTENSION, BENIGN ESSENTIAL 05/02/2010  . LEUKOCYTOSIS 04/20/2010  . COLITIS, HX OF 04/20/2010    Past Surgical History:  Procedure Laterality Date  . LAPAROSCOPIC APPENDECTOMY  02/21/2012   Procedure: APPENDECTOMY LAPAROSCOPIC;  Surgeon: Cherylynn Ridges, MD;  Location: [facility] OR;  Service: General;  Laterality: N/A;       Home Medications    Prior to Admission medications   Medication Sig Start Date End Date Taking? Authorizing Provider  lisinopril-hydrochlorothiazide (PRINZIDE,ZESTORETIC) 20-12.5 MG per tablet Take 2 tablets by mouth daily.    [provider]  loratadine (CLARITIN) 10 MG tablet  12/10/11   [provider]  triamcinolone cream (KENALOG) 0.1 %  12/17/11   [provider]     Family History History reviewed. No pertinent family history.  Social History Social History  Substance Use Topics  . Smoking status: Never Smoker  . Smokeless tobacco: Never Used  . Alcohol use No     Allergies   Patient has no known allergies.   Review of Systems Review of Systems All other systems reviewed and are negative except that which was mentioned in HPI   Physical Exam Updated Vital Signs BP (!) 160/116 (BP Location: Right Arm)   Pulse 93   Temp 98.3 F (36.8 C) (Oral)   Resp 16   SpO2 96%   Physical Exam  Constitutional: He is oriented to person, place, and time. He appears well-developed and well-nourished. No distress.  Awake, alert  HENT:  Head: Normocephalic and atraumatic.  Eyes: Conjunctivae and EOM are normal. Pupils are equal, round, and reactive to light.  Neck: Neck supple.  Cardiovascular: Normal rate, regular rhythm and normal heart sounds.   No murmur heard. Pulmonary/Chest: Effort normal and breath sounds normal. No respiratory distress.  Abdominal: Soft. Bowel sounds are normal. He exhibits no distension. There is no tenderness.  Musculoskeletal: He exhibits no edema.  Neurological: He is alert and oriented to person, place, and time. He has normal reflexes. No sensory deficit. He exhibits normal muscle tone.  Fluent speech, subtle droop at R corner of mouth but remainder of CN intact; normal finger-to-nose testing, + pronator drift on left, no clonus 5/5 strength RUE, RLE 4/5 grip strength LUE, 4/5 straight leg raise LLE  Skin: Skin is warm and dry.  Psychiatric: He has a normal mood and affect.  Nursing note and vitals reviewed.    ED Treatments / Results  Labs (all labs ordered are listed, but only abnormal results are displayed) Labs Reviewed  DIFFERENTIAL - Abnormal; Notable for the following:       Result Value   Monocytes Absolute 1.3 (*)    All other components within normal limits  COMPREHENSIVE METABOLIC PANEL -  Abnormal; Notable for the following:    Glucose, Bld 106 (*)    BUN <5 (*)    Total Protein 8.3 (*)    AST 42 (*)    Total Bilirubin 1.6 (*)    All other components within normal limits  I-STAT CHEM 8, ED - Abnormal; Notable for the following:    Glucose, Bld 105 (*)    Hemoglobin 17.3 (*)    All other components within normal limits  PROTIME-INR  APTT  CBC  I-STAT TROPOININ, ED  CBG MONITORING, ED    EKG  EKG Interpretation  Date/Time:  Tuesday March 26 2017 18:17:26 EDT Ventricular Rate:  92 PR Interval:  152 QRS Duration: 98 QT Interval:  364 QTC Calculation: 450 R Axis:   77 Text Interpretation:  Normal sinus rhythm Nonspecific ST abnormality Abnormal ECG similar to previous Confirmed by Frederick Peers [phone]) on 03/26/2017 7:19:02 PM       Radiology Ct Head Wo Contrast  Result Date: 03/26/2017 CLINICAL DATA:  76 year old hypertensive male with dizziness since last night. Bilateral arm and leg numbness. Facial droop this morning. EXAM: CT HEAD WITHOUT CONTRAST TECHNIQUE: Contiguous axial images were obtained from the base of the skull through the vertex without intravenous contrast. COMPARISON:  None. FINDINGS: Brain: No intracranial hemorrhage. Slightly hyperdense left middle cerebral artery M2 branch possibly related to acute thrombosis versus atherosclerotic changes. No other CT evidence of large acute infarct. Prominent chronic microvascular changes. This limits evaluation for detection of small or moderate-size infarct. Global atrophy. No intracranial mass lesion noted on this unenhanced exam. Vascular: Vascular calcifications in addition to above findings. Skull: No acute abnormality. Sinuses/Orbits: No acute orbital abnormality. Minimal mucosal thickening right maxillary sinus and ethmoid sinus air cells. Other: Mastoid air cells and middle ear cavities are clear. IMPRESSION: No intracranial hemorrhage. Slightly hyperdense left middle cerebral artery M2 branch possibly  related to acute thrombosis versus atherosclerotic changes. No other CT evidence of large acute infarct. Prominent chronic microvascular changes. This limits evaluation for detection of small or moderate-size infarct. Global atrophy. Electronically Signed   By: Lacy Duverney M.D.   On: 03/26/2017 19:07    Procedures Procedures (including critical care time)  Medications Ordered in ED Medications - No data to display   Initial Impression / Assessment and Plan / ED Course  I have reviewed the triage vital signs and the nursing notes.  Pertinent labs & imaging results that were available during my care of the patient were reviewed by me and considered in my medical decision making (see chart for details).     PT w/ 3 days of L arm and leg weakness, L arm numbness, and dizziness. He was awake, alert and comfortable on exam, VS notable for HTN. He had subtle R facial droop at corner of mouth, L arm pronator drift. Labs reassuring. CT head w/ questionable hyperdensity at left MCA, thrombosis vs athrosclerosis.No sudden onset of severe headache to suggest hemorrhage.   I discussed sx and physical exam findings with Dr. Lavonna Monarch, neurology,  who recommended MRI brain and admission to medicine. Discussed w/ hospitalist, Dr. Selena BattenKim, and pt admitted for further care.  Final Clinical Impressions(s) / ED Diagnoses   Final diagnoses:  Dizziness  Left-sided weakness  Dizziness  Left-sided weakness    New Prescriptions New Prescriptions   No medications on file     Cullin Dishman, Ambrose Finlandachel Morgan, MD 03/27/17 0002

## 2017-03-26 NOTE — ED Triage Notes (Addendum)
Pt had dizziness last night. Woke up this morning complaining of numbness. Possible weakness on the right side. Pt alert, no distress noted. LSN last night. Family also reports increased lack of appetite.

## 2017-03-27 ENCOUNTER — Observation Stay (HOSPITAL_COMMUNITY): Payer: Medicare Other

## 2017-03-27 ENCOUNTER — Observation Stay (HOSPITAL_BASED_OUTPATIENT_CLINIC_OR_DEPARTMENT_OTHER): Payer: Medicare Other

## 2017-03-27 DIAGNOSIS — I6330 Cerebral infarction due to thrombosis of unspecified cerebral artery: Secondary | ICD-10-CM

## 2017-03-27 DIAGNOSIS — I350 Nonrheumatic aortic (valve) stenosis: Secondary | ICD-10-CM | POA: Diagnosis not present

## 2017-03-27 DIAGNOSIS — I63511 Cerebral infarction due to unspecified occlusion or stenosis of right middle cerebral artery: Secondary | ICD-10-CM | POA: Diagnosis not present

## 2017-03-27 DIAGNOSIS — R531 Weakness: Secondary | ICD-10-CM | POA: Diagnosis not present

## 2017-03-27 LAB — LIPID PANEL
CHOL/HDL RATIO: 4.5 ratio
Cholesterol: 187 mg/dL (ref 0–200)
HDL: 42 mg/dL (ref >40)
LDL Cholesterol: 126 mg/dL — ABNORMAL HIGH (ref 0–99)
Triglycerides: 97 mg/dL (ref <150)
VLDL: 19 mg/dL (ref 0–40)

## 2017-03-27 LAB — ECHOCARDIOGRAM COMPLETE
AOASC: 40 cm
AVPHT: 598 ms
CHL CUP DOP CALC LVOT VTI: 19.1 cm
E/e' ratio: 7.16
EWDT: 275 ms
FS: 28 % (ref 28–44)
Height: 67 in
IVS/LV PW RATIO, ED: 0.87
LA ID, A-P, ES: 21 mm
LA diam end sys: 21 mm
LA vol: 28.4 mL
LADIAMINDEX: 1.19 cm/m2
LAVOLA4C: 26.5 mL
LAVOLIN: 16.1 mL/m2
LV E/e' medial: 7.16
LV TDI E'MEDIAL: 4.88
LV dias vol index: 21 mL/m2
LV e' LATERAL: 6.72 cm/s
LVDIAVOL: 37 mL — AB (ref 62–150)
LVEEAVG: 7.16
LVOT SV: 60 mL
LVOT area: 3.14 cm2
LVOT diameter: 20 mm
LVOTPV: 87.5 cm/s
Lateral S' vel: 12.6 cm/s
MV Dec: 275
MVPKAVEL: 105 m/s
MVPKEVEL: 48.1 m/s
PW: 12.5 mm — AB (ref 0.6–1.1)
RV TAPSE: 26.7 mm
TDI e' lateral: 6.72
Weight: 2318.4 oz

## 2017-03-27 LAB — COMPREHENSIVE METABOLIC PANEL
ALBUMIN: 3.8 g/dL (ref 3.5–5.0)
ALK PHOS: 51 U/L (ref 38–126)
ALT: 32 U/L (ref 17–63)
ANION GAP: 11 (ref 5–15)
AST: 38 U/L (ref 15–41)
BILIRUBIN TOTAL: 2.2 mg/dL — AB (ref 0.3–1.2)
BUN: 5 mg/dL — AB (ref 6–20)
CALCIUM: 9.5 mg/dL (ref 8.9–10.3)
CO2: 22 mmol/L (ref 22–32)
Chloride: 105 mmol/L (ref 101–111)
Creatinine, Ser: 0.69 mg/dL (ref 0.61–1.24)
GFR calc Af Amer: >60 mL/min (ref >60)
GFR calc non Af Amer: >60 mL/min (ref >60)
GLUCOSE: 103 mg/dL — AB (ref 65–99)
Potassium: 3.7 mmol/L (ref 3.5–5.1)
Sodium: 138 mmol/L (ref 135–145)
TOTAL PROTEIN: 7.8 g/dL (ref 6.5–8.1)

## 2017-03-27 LAB — GLUCOSE, CAPILLARY
GLUCOSE-CAPILLARY: 86 mg/dL (ref 65–99)
GLUCOSE-CAPILLARY: 107 mg/dL — AB (ref 65–99)
GLUCOSE-CAPILLARY: 73 mg/dL (ref 65–99)
Glucose-Capillary: 124 mg/dL — ABNORMAL HIGH (ref 65–99)

## 2017-03-27 LAB — SEDIMENTATION RATE: Sed Rate: 8 mm/hr (ref 0–16)

## 2017-03-27 LAB — TSH: TSH: 9.91 u[IU]/mL — ABNORMAL HIGH (ref 0.350–4.500)

## 2017-03-27 LAB — VITAMIN B12: Vitamin B-12: 236 pg/mL (ref 180–914)

## 2017-03-27 LAB — T4, FREE: FREE T4: 0.93 ng/dL (ref 0.61–1.12)

## 2017-03-27 NOTE — Progress Notes (Signed)
  Echocardiogram 2D Echocardiogram has been performed.  Janalyn HarderWest, Seema Blum R 03/27/2017, 12:23 PM

## 2017-03-27 NOTE — Progress Notes (Signed)
PROGRESS NOTE    [name]  ZOX:[id number] DOB: [birth year] DOA: 03/26/2017 PCP: Fleet Contras, MD    Brief Narrative:   76 y.o. male with history of  hypertension, dm2, who presented to the ED with complaints of dizziness and numbness in the left arm and left leg.    Assessment & Plan:    Dizziness/strokelike symptoms -Neurology consulted and per PAs note patient has had progressively worsening headache and progressively worsening dizziness that began on July 1. Presently per her evaluation patient is without left-sided weakness, dizziness, or headache  - currently awaiting final recommendations by neurology  - Elevated TSH will obtain free t4 and free t3 - CT scan of head reported no intracranial hemorrhage and Slightly hyperdense left middle cerebral artery M2 branch possibly related to acute thrombosis versus atherosclerotic changes. No other CT evidence of large acute infarct  Active Problems:   HYPERTENSION, BENIGN ESSENTIAL -Patient is currently on hydrochlorothiazide and losartan. No clear evidence of stroke. Awaiting recommendations from neurology team    Diabetic complication [facility]) - Patient is on Amaryl and sliding scale insulin with night coverage. Will continue as well as carb modified    DVT prophylaxis: SCD's Code Status: Full Family Communication: Discussed with patient and family members at bedside. Particularly daughter  Disposition Plan: pending workup and neurology recommendations    Consultants:  Neurology  Procedures  None   Antimicrobials: None   Subjective: Pt has no new complaints. Much of the conversation was discussed with daughter and patient is reportedly translator service was poor in communication translation   Objective: Vitals:   03/27/17 0408 03/27/17 0610 03/27/17 0820 03/27/17 1015  BP: (!) 161/110 (!) 145/92 (!) 136/95 134/77  Pulse: 83 88 91 83  Resp:  16 18 18   Temp: (!) 96.8 F (36 C) (!) 96.8 F (36 C)    TempSrc: Oral  Oral    SpO2: 96% 98% 98% 98%  Weight:      Height:        Intake/Output Summary (Last 24 hours) at 03/27/17 1617 Last data filed at 03/27/17 0600  Gross per 24 hour  Intake              350 ml  Output                0 ml  Net              350 ml   Filed Weights   03/26/17 2230  Weight: 65.7 kg (144 lb 14.4 oz)    Examination:  General exam: Appears calm and comfortable, in nad. Respiratory system: Clear to auscultation. Respiratory effort normal. Cardiovascular system: S1 & S2 heard, RRR. No JVD, murmurs, rubs, gallops or clicks. No pedal edema. Gastrointestinal system: Abdomen is nondistended, soft and nontender. No organomegaly or masses felt. Normal bowel sounds heard. Central nervous system: Alert and awake,  No facial asymmetry Extremities: warm + distal pulses Skin: No rashes, lesions or ulcers, on limited exam. Psychiatry:  Difficult exam secondary to limited communication    Data Reviewed: I have personally reviewed following labs and imaging studies  CBC:  Recent Labs Lab 03/26/17 1823 03/26/17 1846  WBC 9.4  --   NEUTROABS 5.2  --   HGB 16.8 17.3*  HCT 47.4 51.0  MCV 93.5  --   PLT 185  --    Basic Metabolic Panel:  Recent Labs Lab 03/26/17 1823 03/26/17 1846 03/27/17 0355  NA 136 140 138  K 3.8 3.7  3.7  CL 102 102 105  CO2 24  --  22  GLUCOSE 106* 105* 103*  BUN <5* 6 5*  CREATININE 0.82 0.70 0.69  CALCIUM 9.8  --  9.5   GFR: Estimated Creatinine Clearance: 74.1 mL/min (by C-G formula based on SCr of 0.69 mg/dL). Liver Function Tests:  Recent Labs Lab 03/26/17 1823 03/27/17 0355  AST 42* 38  ALT 36 32  ALKPHOS 58 51  BILITOT 1.6* 2.2*  PROT 8.3* 7.8  ALBUMIN 4.1 3.8   No results for input(s): LIPASE, AMYLASE in the last 168 hours. No results for input(s): AMMONIA in the last 168 hours. Coagulation Profile:  Recent Labs Lab 03/26/17 1823  INR 1.06   Cardiac Enzymes: No results for input(s): CKTOTAL, CKMB, CKMBINDEX,  TROPONINI in the last 168 hours. BNP (last 3 results) No results for input(s): PROBNP in the last 8760 hours. HbA1C: No results for input(s): HGBA1C in the last 72 hours. CBG:  Recent Labs Lab 03/27/17 0632 03/27/17 1122  GLUCAP 124* 86   Lipid Profile:  Recent Labs  03/27/17 0355  CHOL 187  HDL 42  LDLCALC 126*  TRIG 97  CHOLHDL 4.5   Thyroid Function Tests:  Recent Labs  03/27/17 0355  TSH 9.910*   Anemia Panel:  Recent Labs  03/27/17 0355  VITAMINB12 236   Sepsis Labs: No results for input(s): PROCALCITON, LATICACIDVEN in the last 168 hours.  No results found for this or any previous visit (from the past 240 hour(s)).       Radiology Studies: Dg Chest 2 View  Result Date: 03/27/2017 CLINICAL DATA:  Dizziness and left-sided weakness. EXAM: CHEST  2 VIEW COMPARISON:  02/21/2012 FINDINGS: Lungs are adequately inflated without focal consolidation or effusion. Patient slightly rotated to the left. Cardiomediastinal silhouette is within normal. Subtle calcified plaque over the aortic arch. Mild degenerate change of the spine. IMPRESSION: No acute cardiopulmonary disease. Minimal aortic atherosclerosis. Electronically Signed   By: Elberta Fortis M.D.   On: 03/27/2017 07:50   Ct Head Wo Contrast  Result Date: 03/26/2017 CLINICAL DATA:  76 year old hypertensive male with dizziness since last night. Bilateral arm and leg numbness. Facial droop this morning. EXAM: CT HEAD WITHOUT CONTRAST TECHNIQUE: Contiguous axial images were obtained from the base of the skull through the vertex without intravenous contrast. COMPARISON:  None. FINDINGS: Brain: No intracranial hemorrhage. Slightly hyperdense left middle cerebral artery M2 branch possibly related to acute thrombosis versus atherosclerotic changes. No other CT evidence of large acute infarct. Prominent chronic microvascular changes. This limits evaluation for detection of small or moderate-size infarct. Global atrophy. No  intracranial mass lesion noted on this unenhanced exam. Vascular: Vascular calcifications in addition to above findings. Skull: No acute abnormality. Sinuses/Orbits: No acute orbital abnormality. Minimal mucosal thickening right maxillary sinus and ethmoid sinus air cells. Other: Mastoid air cells and middle ear cavities are clear. IMPRESSION: No intracranial hemorrhage. Slightly hyperdense left middle cerebral artery M2 branch possibly related to acute thrombosis versus atherosclerotic changes. No other CT evidence of large acute infarct. Prominent chronic microvascular changes. This limits evaluation for detection of small or moderate-size infarct. Global atrophy. Electronically Signed   By: Lacy Duverney M.D.   On: 03/26/2017 19:07    Scheduled Meds: . atorvastatin  80 mg Oral q1800  . clopidogrel  75 mg Oral Daily  . glimepiride  4 mg Oral Q breakfast  . losartan  100 mg Oral Daily   And  . hydrochlorothiazide  12.5 mg Oral  Daily  . insulin aspart  0-5 Units Subcutaneous QHS  . insulin aspart  0-9 Units Subcutaneous TID WC  . metFORMIN  500 mg Oral BID WC   Continuous Infusions:   LOS: 0 days    Time spent: > 35 minutes  Penny PiaVEGA, Anyia Gierke, MD [facility] Pager [phone]  If 7PM-7AM, please contact night-coverage [email or web address] Password [email or web address] 03/27/2017, 4:17 PM

## 2017-03-27 NOTE — Progress Notes (Signed)
PT Cancellation Note  Patient Details Name: [name] MRN: [id number] DOB: 01/03/1941   Cancelled Treatment:    Reason Eval/Treat Not Completed: Patient at procedure or test/unavailable.  Echo is in the room currently, PT to check back later as time allows.   Thanks,    Rollene Rotundaebecca B. Cintia Gleed, PT, DPT [phone]   03/27/2017, 12:04 PM

## 2017-03-27 NOTE — Evaluation (Signed)
Physical Therapy Evaluation Patient Details Name: [name] MRN: [id number] DOB: 09-27-1940 Today's Date: 03/27/2017   History of Present Illness  76 y.o. male admitted on 03/26/17 with left sided weakness and dizziness.  CT concerning for L MCA CVA and MRI is pending.  Pt with significant PMHx of HTN, DM.  Clinical Impression  Pt presents with right sided gaze preference and left sided visual field deficit.  He is inattentive to his left side without cues and runs into things in the hallway on his left needing auditory and verbal cues to find upcoming obstacles on this side.  Daughter present and witnessing his difficulty.  She reports he needs the same amount of physical assist as before, but the left sided deficits are new.  He would benefit from f/u therapy at discharge, but wants to go home.  I am not sure of how much home therapy he will qualify for given his medicaid status, but daughter was interested in pursuing this at discharge.   PT to follow acutely for deficits listed below.       Follow Up Recommendations Home health PT;Supervision/Assistance - 24 hour    Equipment Recommendations  None recommended by PT    Recommendations for Other Services   NA    Precautions / Restrictions Precautions Precautions: Fall Precaution Comments: left inattention      Mobility  Bed Mobility Overal bed mobility: Needs Assistance Bed Mobility: Supine to Sit     Supine to sit: Supervision     General bed mobility comments: was able to get up on left side of bed with HOB flat and no rail.  Supervision for safety, tactile tapping to get him to remember to bring his left leg back up into bed at the end of the session.    Transfers Overall transfer level: Needs assistance Equipment used: Straight cane Transfers: Sit to/from Stand Sit to Stand: Min guard         General transfer comment: Min guard assist for safety and balance.   Ambulation/Gait Ambulation/Gait assistance: Min  assist;Mod assist Ambulation Distance (Feet): 45 Feet Assistive device: Straight cane Gait Pattern/deviations: Step-through pattern;Decreased stance time - left;Decreased step length - left;Decreased weight shift to left;Drifts right/left Gait velocity: decreased Gait velocity interpretation: Below normal speed for age/gender General Gait Details: Pt drifts to the left, holds the cane in his left hand and has no awareness of any obstacles on his left side.  If I tapped on them and made noise he would then look past midline to the left and acknowledge, but would not scan on his own without verbal and auditory cues.        Modified Rankin (Stroke Patients Only) Modified Rankin (Stroke Patients Only) Pre-Morbid Rankin Score: Moderately severe disability Modified Rankin: Moderately severe disability     Balance Overall balance assessment: Needs assistance Sitting-balance support: Feet supported;No upper extremity supported Sitting balance-Leahy Scale: Good Sitting balance - Comments: sat EOB and able to get his shoes on independently given extra time.    Standing balance support: Single extremity supported Standing balance-Leahy Scale: Poor Standing balance comment: need min guard assist in standing.                               Pertinent Vitals/Pain Pain Assessment: No/denies pain    Home Living Family/patient expects to be discharged to:: Private residence Living Arrangements: Children Available Help at Discharge: Family;Available 24 hours/day Type of Home: House  Home Layout: Two level Home Equipment: Cane - single point      Prior Function Level of Independence: Needs assistance   Gait / Transfers Assistance Needed: family would assist him to walk around the home with the cane, he could do his own bathing, dressing, and toileting until a week ago.            Hand Dominance   Dominant Hand: Left    Extremity/Trunk Assessment   Upper Extremity  Assessment Upper Extremity Assessment: Defer to OT evaluation    Lower Extremity Assessment Lower Extremity Assessment: RLE deficits/detail;LLE deficits/detail RLE Deficits / Details: with gross MMT and sensory testing EOB he seemed nearly equally strong with hip flexion and knee flexion, but it took multiple repetitions and multiple reps to get him to understand what I wanted him to do with poor effort/result.   RLE Sensation:  (intact to LT) LLE Deficits / Details: left leg with seated resisted hip flexion and knee extension testing seemed equal to right, however, functionally he seemed to keep the left knee flexed more and seemed more effortful to progress it foward during gait.  LLE Sensation:  (intact to LT)    Cervical / Trunk Assessment Cervical / Trunk Assessment: Normal  Communication   Communication: Prefers language other than [place] [name]([place], daughter translating)  Cognition Arousal/Alertness: Awake/alert Behavior During Therapy: Flat affect Overall Cognitive Status: Difficult to assess                                        General Comments General comments (skin integrity, edema, etc.): pt had difficulty with visual tracking to his left side, horizontal canal testing and rolling did not produce any abnormal nystagmus or dizziness symptoms.  He does wear bifocals, he did seem to have a right gaze preference and likely left visual field deficit.         Assessment/Plan    PT Assessment Patient needs continued PT services  PT Problem List Decreased strength;Decreased activity tolerance;Decreased balance;Decreased mobility;Decreased knowledge of use of DME;Decreased safety awareness       PT Treatment Interventions DME instruction;Gait training;Stair training;Functional mobility training;Therapeutic activities;Therapeutic exercise;Balance training;Neuromuscular re-education;Cognitive remediation;Patient/family education    PT Goals (Current goals can be  found in the Care Plan section)  Acute Rehab PT Goals Patient Stated Goal: to go home ASAP PT Goal Formulation: With patient/family Time For Goal Achievement: 04/10/17 Potential to Achieve Goals: Good    Frequency Min 4X/week           AM-PAC PT "6 Clicks" Daily Activity  Outcome Measure Difficulty turning over in bed (including adjusting bedclothes, sheets and blankets)?: None Difficulty moving from lying on back to sitting on the side of the bed? : None Difficulty sitting down on and standing up from a chair with arms (e.g., wheelchair, bedside commode, etc,.)?: Total Help needed moving to and from a bed to chair (including a wheelchair)?: A Little Help needed walking in hospital room?: A Lot Help needed climbing 3-5 steps with a railing? : A Lot 6 Click Score: 16    End of Session Equipment Utilized During Treatment: Gait belt Activity Tolerance: Patient limited by fatigue Patient left: in bed;with call bell/phone within reach;with family/visitor present   PT Visit Diagnosis: Unsteadiness on feet (R26.81);Muscle weakness (generalized) (M62.81);History of falling (Z91.81)    Time: [id number] PT Time Calculation (min) (ACUTE ONLY): 41 min   Charges:  PT Evaluation $PT Eval Moderate Complexity: 1 Procedure PT Treatments $Gait Training: 8-22 mins $Therapeutic Activity: 8-22 mins   PT G Codes:           Benjaman Artman B. Brinson Tozzi, PT, DPT [phone]   PT G-Codes **NOT FOR INPATIENT CLASS** Functional Assessment Tool Used: AM-PAC 6 Clicks Basic Mobility Functional Limitation: Mobility: Walking and moving around Mobility: Walking and Moving Around Current Status (W4132): At least 40 percent but less than 60 percent impaired, limited or restricted Mobility: Walking and Moving Around Goal Status [id number]): At least 20 percent but less than 40 percent impaired, limited or restricted   03/27/2017, 4:08 PM

## 2017-03-27 NOTE — Progress Notes (Signed)
SLP Cancellation Note  Patient Details Name: [name] MRN: [id number] DOB: 07/08/1941   Cancelled treatment:       Reason Eval/Treat Not Completed: Other (comment) Pt currently working with PT - will f/u at a later time.    Maxcine Hamaiewonsky, Dacen Frayre 03/27/2017, 3:17 PM  Maxcine HamLaura Paiewonsky, M.A. CCC-SLP [phone]

## 2017-03-27 NOTE — Progress Notes (Signed)
*  PRELIMINARY RESULTS* Vascular Ultrasound Carotid Duplex (Doppler) has been completed.  Preliminary findings: Bilateral 1-39% ICA stenosis, antegrade vertebral flow.   Chauncey FischerCharlotte C Kaitlyn Skowron 03/27/2017, 1:09 PM

## 2017-03-27 NOTE — Consult Note (Signed)
NEURO HOSPITALIST CONSULT NOTE   Requesting physician: Dr. Cena BentonVega  Reason for Consult: Left sided weakness  History obtained from:  Patient and Chart   HPI:                                                                                                                                         * Nepali interpreter was called upon for this patient interaction; however, per his daughter (caregiver, at bedside) the interpreter did not translate some of my questions and did not interpret much of what the patient responded. I utilized the interpreter, but mostly the daughter to communicate with the patient.   [name] is an 76 y.o. male who [facility] complaining of left sided weakness (left eye, LUE, LLE), progressively worsening headache and progressively worsening dizziness that began on July 1. He does not endorse any sensory changes. He stated that he woke from sleeping with these symptoms. [name] said that the dizziness made him feel as though he was going to fall over, but could not articulate if this was a room-spinning or light-headed feeling. All symptoms lasted three days, approximately until [facility]. Presently, he is without left sided weakness, dizziness or headache. He would like to go home.  Per the daughter, he has had progressive weakness and dizziness since 2015. At baseline, he is mostly chair-bound, walking only occasionally around the home. The daughter controls his medications and states that he has not missed any doses.   Pertinent Diagnostics: TSH: 9.91 CT head 7/3: No intracranial hemorrhage. Slightly hyperdense left middle cerebral artery M2 branch possibly related to acute thrombosis versus atherosclerotic changes. No other CT evidence of large acute infarct. Prominent chronic microvascular changes.   Past Medical History:  Diagnosis Date  . Diabetes (HCC)   . Dizziness   . Hypertension     Past Surgical History:  Procedure  Laterality Date  . LAPAROSCOPIC APPENDECTOMY  02/21/2012   Procedure: APPENDECTOMY LAPAROSCOPIC;  Surgeon: Cherylynn RidgesJames O Wyatt, MD;  [facility];  Service: General;  Laterality: N/A;    Family History  Problem Relation Age of Onset  . Diabetes Neg Hx   . Stroke Neg Hx   . Cancer Neg Hx     Social History:  reports that he has never smoked. He has never used smokeless tobacco. He reports that he does not drink alcohol or use drugs.  No Known Allergies  MEDICATIONS:  Current Meds  Medication Sig  . albuterol (PROVENTIL HFA;VENTOLIN HFA) 108 (90 Base) MCG/ACT inhaler Inhale 2 puffs into the lungs every 6 (six) hours as needed for wheezing or shortness of breath.  Marland Kitchen aspirin EC 81 MG tablet Take 81 mg by mouth daily.  Marland Kitchen glimepiride (AMARYL) 4 MG tablet Take 4 mg by mouth daily with breakfast.  . lisinopril-hydrochlorothiazide (PRINZIDE,ZESTORETIC) 20-12.5 MG per tablet Take 2 tablets by mouth daily.  Marland Kitchen loratadine (CLARITIN) 10 MG tablet Take 10 mg by mouth daily.   Marland Kitchen losartan-hydrochlorothiazide (HYZAAR) 100-12.5 MG tablet Take 1 tablet by mouth daily.  . meclizine (ANTIVERT) 12.5 MG tablet Take 12.5 mg by mouth 3 (three) times daily as needed for dizziness.  . metFORMIN (GLUCOPHAGE) 500 MG tablet Take 500 mg by mouth 2 (two) times daily with a meal.     Review Of Systems:                                                                                                           History obtained from the patient and caregiver, and was somewhat limited by a language barrier.  General: Negative for fatigue, fever Psychological: Negative for any known behavioral disorder Ophthalmic: Negative for blurry or double vision ENT: Negative for abrupt loss of hearing, negative for dizziness currently Respiratory: Negative for cough, shortness of breath Cardiovascular: Negative for chest  pain, edema or irregular heartbeat Gastrointestinal: Negative for abdominal pain Genito-Urinary: Negative for dysuria Musculoskeletal: Negative for joint swelling or muscular weakness Neurological: As noted in HPI Dermatological: Negative for rash or skin changes, numbness or tingling  Blood pressure (!) 136/95, pulse 91, temperature (!) 96.8 F (36 C), temperature source Oral, resp. rate 18, height 5\' 7"  (1.702 m), weight 65.7 kg (144 lb 14.4 oz), SpO2 98 %.   Physical Examination:                                                                                                      General: WDWN male. Appears calm and comfortable sitting on the side of the bed HEENT:  Normocephalic, no lesions, without obvious abnormality.  Normal external eye and conjunctiva.  Normal external ears. Normal external nose, mucus membranes and septum.  Normal pharynx. Cardiovascular: S1, S2 normal, pulses palpable throughout   Pulmonary: chest clear, no wheezing, rales, normal symmetric air entry Abdomen: soft, non-tender Extremities: no joint deformities, effusion, or inflammation and no edema Musculoskeletal: no joint tenderness, deformity or swelling; Tone and bulk WNL for age and mobility  Skin: warm and dry, no hyperpigmentation, vitiligo, or suspicious lesions  Neurological Examination:  Mental Status: [name] is alert, thought content appropriate.  Unable to assess speech due to language barrier. Able to follow simple commands without difficulty. Cranial Nerves: II: Left hemianopia, pupils are equal, round, reactive to light. III,IV, VI: Ptosis not present, extra-ocular muscle movements intact bilaterally, but he appears to have a right gaze preference V,VII: Smile is transverse (possible lack of effort) and eyebrow raise is symmetric. Facial light touch and pinprick sensation intact bilaterally VIII:  Hearing grossly intact IX,X: Uvula and palate rise symmetrically XI: SCM and bilateral shoulder shrug strength 5/5 XII: Midline tongue extension Motor: Right :     Upper extremity   5/5           Lower extremity   5/5           Left:        Upper extremity   5/5          Hip flexion   5/5; dorsiflexion and plantar flexion mute - no prior injury per daughter. Sensory: Pinprick and light touch intact throughout, bilaterally. Temperature detected at low shin on right, mid shin on left. Deep Tendon Reflexes: 2+ and symmetric throughout BUE, dropped BLE. Plantars: Right: downgoing   Left: downgoing Cerebellar: Finger-to-nose test without evidence of ataxia. Gait: Not tested.   Lab Results: Basic Metabolic Panel:  Recent Labs Lab 03/26/17 1823 03/26/17 1846 03/27/17 0355  NA 136 140 138  K 3.8 3.7 3.7  CL 102 102 105  CO2 24  --  22  GLUCOSE 106* 105* 103*  BUN <5* 6 5*  CREATININE 0.82 0.70 0.69  CALCIUM 9.8  --  9.5    Liver Function Tests:  Recent Labs Lab 03/26/17 1823 03/27/17 0355  AST 42* 38  ALT 36 32  ALKPHOS 58 51  BILITOT 1.6* 2.2*  PROT 8.3* 7.8  ALBUMIN 4.1 3.8   No results for input(s): LIPASE, AMYLASE in the last 168 hours. No results for input(s): AMMONIA in the last 168 hours.  CBC:  Recent Labs Lab 03/26/17 1823 03/26/17 1846  WBC 9.4  --   NEUTROABS 5.2  --   HGB 16.8 17.3*  HCT 47.4 51.0  MCV 93.5  --   PLT 185  --     Cardiac Enzymes: No results for input(s): CKTOTAL, CKMB, CKMBINDEX, TROPONINI in the last 168 hours.  Lipid Panel:  Recent Labs Lab 03/27/17 0355  CHOL 187  TRIG 97  HDL 42  CHOLHDL 4.5  VLDL 19  LDLCALC 161*    CBG:  Recent Labs Lab 03/27/17 0632  GLUCAP 124*    Microbiology: Results for orders placed or performed during the hospital encounter of 04/20/10  Culture, blood (routine x 2)     Status: None   Collection Time: 04/20/10  8:50 PM  Result Value Ref Range Status   Specimen Description BLOOD  LEFT ARM  Final   Special Requests   Final    BOTTLES DRAWN AEROBIC AND ANAEROBIC 10CC PATIENT ON FOLLOWING IMBANZ,CIPRO,FLAGLY   Culture NO GROWTH 5 DAYS  Final   Report Status 04/27/2010 FINAL  Final  Culture, blood (routine x 2)     Status: None   Collection Time: 04/20/10  9:00 PM  Result Value Ref Range Status   Specimen Description BLOOD LEFT HAND  Final   Special Requests   Final    BOTTLES DRAWN AEROBIC ONLY 10CC PATIENT ON FOLLOWING INBANZ,CIPRO,FLAGYL   Culture NO GROWTH 5 DAYS  Final   Report Status 04/27/2010  FINAL  Final  Fecal lactoferrin     Status: None   Collection Time: 04/21/10  3:46 PM  Result Value Ref Range Status   Specimen Description STOOL  Final   Special Requests NONE  Final   Fecal Lactoferrin POSITIVE  Final   Report Status 04/22/2010 FINAL  Final  Stool culture     Status: None   Collection Time: 04/21/10  3:46 PM  Result Value Ref Range Status   Specimen Description STOOL  Final   Special Requests NONE  Final   Culture   Final    NO SALMONELLA, SHIGELLA, CAMPYLOBACTER, OR YERSINIA ISOLATED   Report Status 04/25/2010 FINAL  Final  Clostridium difficile EIA     Status: None   Collection Time: 04/21/10  3:47 PM  Result Value Ref Range Status   Specimen Description STOOL  Final   Special Requests NONE  Final   C difficile Toxins A+B, EIA NEGATIVE  Final   Report Status 04/22/2010 FINAL  Final    Coagulation Studies:  Recent Labs  03/26/17 1823  LABPROT 13.8  INR 1.06    Imaging: Dg Chest 2 View  Result Date: 03/27/2017 CLINICAL DATA:  Dizziness and left-sided weakness. EXAM: CHEST  2 VIEW COMPARISON:  02/21/2012 FINDINGS: Lungs are adequately inflated without focal consolidation or effusion. Patient slightly rotated to the left. Cardiomediastinal silhouette is within normal. Subtle calcified plaque over the aortic arch. Mild degenerate change of the spine. IMPRESSION: No acute cardiopulmonary disease. Minimal aortic atherosclerosis.  Electronically Signed   By: Elberta Fortis M.D.   On: 03/27/2017 07:50   Ct Head Wo Contrast  Result Date: 03/26/2017 CLINICAL DATA:  76 year old hypertensive male with dizziness since last night. Bilateral arm and leg numbness. Facial droop this morning. EXAM: CT HEAD WITHOUT CONTRAST TECHNIQUE: Contiguous axial images were obtained from the base of the skull through the vertex without intravenous contrast. COMPARISON:  None. FINDINGS: Brain: No intracranial hemorrhage. Slightly hyperdense left middle cerebral artery M2 branch possibly related to acute thrombosis versus atherosclerotic changes. No other CT evidence of large acute infarct. Prominent chronic microvascular changes. This limits evaluation for detection of small or moderate-size infarct. Global atrophy. No intracranial mass lesion noted on this unenhanced exam. Vascular: Vascular calcifications in addition to above findings. Skull: No acute abnormality. Sinuses/Orbits: No acute orbital abnormality. Minimal mucosal thickening right maxillary sinus and ethmoid sinus air cells. Other: Mastoid air cells and middle ear cavities are clear. IMPRESSION: No intracranial hemorrhage. Slightly hyperdense left middle cerebral artery M2 branch possibly related to acute thrombosis versus atherosclerotic changes. No other CT evidence of large acute infarct. Prominent chronic microvascular changes. This limits evaluation for detection of small or moderate-size infarct. Global atrophy. Electronically Signed   By: Lacy Duverney M.D.   On: 03/26/2017 19:07    Thank you for consulting the [facility] team. Assessment and recommendations per attending neurologist.  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 He is currently undergoing stroke workup.  1. HgbA1c, fasting lipid  panel 2. MRI, MRA  of the brain without contrast 3. Frequent neuro checks 4. Echocardiogram 5. Carotid dopplers 6. Prophylactic therapy-Antiplatelet med: Aspirin - dose 325mg  PO or 300mg  PR 7. Risk factor modification 8. Telemetry monitoring 9. PT consult, OT consult, Speech consult 10. please page stroke NP  Or  PA  Or MD  from 8am -  4 pm as this patient will be followed by the stroke team at this point.   You can look them up on [email or web address]    Ritta Slot, MD [facility] [phone]  If 7pm- 7am, please page neurology on call as listed in AMION.

## 2017-03-27 NOTE — Evaluation (Signed)
Occupational Therapy Evaluation Patient Details Name: [name] MRN: [id number] DOB: 05/31/1941 Today's Date: 03/27/2017    History of Present Illness 76 y.o. male admitted on 03/26/17 with left sided weakness and dizziness.  CT concerning for L MCA CVA and MRI is pending.  Pt with significant PMHx of HTN, DM.   Clinical Impression   PTA Pt independent in ADL and mobility with SPC. Pt is currently min A for ADL and min guard assist with SPC. Please see OT problem list below. Pt with gaze preference and left visual field deficits - but still need further assessment in continued functional setting. LUE<RUE in strength, functional range of motion. Pt will require continued OT in the acute setting to maximize safety and independence in ADL and compensatory strategies for visual deficits. HHOT recommended at this time as Pt is strongly declining CIR. "I just want to go home" Pt seemed tired of all the testing at the end of session with very flat affect. Next session to focus on sink level grooming incorporating visual strategies.     Follow Up Recommendations  Home health OT;Supervision/Assistance - 24 hour    Equipment Recommendations  3 in 1 bedside commode    Recommendations for Other Services       Precautions / Restrictions Precautions Precautions: Fall Precaution Comments: left inattention Restrictions Weight Bearing Restrictions: No      Mobility Bed Mobility Overal bed mobility: Needs Assistance Bed Mobility: Supine to Sit     Supine to sit: Supervision     General bed mobility comments: was able to get up on right side of bed with HOB flat and no rail.  Supervision for safety  Transfers Overall transfer level: Needs assistance Equipment used: Straight cane Transfers: Sit to/from Stand Sit to Stand: Min guard         General transfer comment: Min guard assist for safety and balance.     Balance Overall balance assessment: Needs assistance Sitting-balance  support: Feet supported;No upper extremity supported Sitting balance-Leahy Scale: Good Sitting balance - Comments: sat EOB and able to get his shoes on independently given extra time.    Standing balance support: Single extremity supported Standing balance-Leahy Scale: Poor Standing balance comment: need min guard assist in standing.                             ADL either performed or assessed with clinical judgement   ADL Overall ADL's : Needs assistance/impaired Eating/Feeding: Minimal assistance;With caregiver independent assisting;Sitting Eating/Feeding Details (indicate cue type and reason): Daughter reports that her father was not needing any assist PTA, and now she helps him - and will continue to help him after dc Grooming: Wash/dry hands;Wash/dry face;Min guard;Standing Grooming Details (indicate cue type and reason): sink level Upper Body Bathing: Min guard;With caregiver independent assisting   Lower Body Bathing: Min guard;With caregiver independent assisting   Upper Body Dressing : Set up;Sitting   Lower Body Dressing: Set up;Sit to/from stand Lower Body Dressing Details (indicate cue type and reason): to don shoes and socks Toilet Transfer: Min guard;With caregiver independent assisting;Ambulation;Comfort height toilet;Grab bars (SPC)   Toileting- Clothing Manipulation and Hygiene: Supervision/safety;Sit to/from stand   Tub/ Shower Transfer: Tub transfer;Minimal assistance;With caregiver independent assisting;Ambulation   Functional mobility during ADLs: Min guard;Cueing for safety;Cane General ADL Comments: Pt had trouble following directions and distracts easily     Vision Baseline Vision/History: Wears glasses Wears Glasses: At all times Vision Assessment?: Vision  impaired- to be further tested in functional context;Yes Eye Alignment: Within Functional Limits Ocular Range of Motion: Restricted looking up;Restricted on the left;Impaired-to be further  tested in functional context Alignment/Gaze Preference: Gaze right Tracking/Visual Pursuits: Requires cues, head turns, or add eye shifts to track;Impaired - to be further tested in functional context Visual Fields: Left visual field deficit;Impaired-to be further tested in functional context;Other (comment) (also potentially a superior field cut) Additional Comments: Pt had trouble with following directions during visual testing - persistent right gaze preference and Pt is unable to track superior and to the left even with auditory cues (clicking pen). Pt seemed to lose interest after approx 5 seconds of visual testing - Pt needs to continue to be tested in a functional context. NO glasses present for Pt this session.     Perception     Praxis      Pertinent Vitals/Pain Pain Assessment: No/denies pain     Hand Dominance Left   Extremity/Trunk Assessment Upper Extremity Assessment Upper Extremity Assessment: LUE deficits/detail LUE Deficits / Details: strength L<R but overall functional. MMT was difficult to get Pt to follow directions to achieve testing - not sure wether from language barriers or cognitive LUE Sensation:  [facility] - denies differences between L and R)   Lower Extremity Assessment Lower Extremity Assessment: Defer to PT evaluation RLE Deficits / Details: with gross MMT and sensory testing EOB he seemed nearly equally strong with hip flexion and knee flexion, but it took multiple repetitions and multiple reps to get him to understand what I wanted him to do with poor effort/result.   RLE Sensation:  (intact to LT) LLE Deficits / Details: left leg with seated resisted hip flexion and knee extension testing seemed equal to right, however, functionally he seemed to keep the left knee flexed more and seemed more effortful to progress it foward during gait.  LLE Sensation:  (intact to LT)   Cervical / Trunk Assessment Cervical / Trunk Assessment: Normal   Communication  Communication Communication: Prefers language other than [place] [name], daughter translating)   Cognition Arousal/Alertness: Awake/alert Behavior During Therapy: Flat affect Overall Cognitive Status: Difficult to assess                                     General Comments  pt had difficulty with visual tracking to his left side, horizontal canal testing and rolling did not produce any abnormal nystagmus or dizziness symptoms.  He does wear bifocals, he did seem to have a right gaze preference and likely left visual field deficit.     Exercises     Shoulder Instructions      Home Living Family/patient expects to be discharged to:: Private residence Living Arrangements: Children Available Help at Discharge: Family;Available 24 hours/day Type of Home: House       Home Layout: Two level Alternate Level Stairs-Number of Steps: 12 Alternate Level Stairs-Rails: Right Bathroom Shower/Tub: Chief Strategy Officer: Standard     Home Equipment: Cane - single point          Prior Functioning/Environment Level of Independence: Needs assistance  Gait / Transfers Assistance Needed: family would assist him to walk around the home with the cane ADL's / Homemaking Assistance Needed: he could do his own bathing, dressing, feeding, and toileting until a week ago.  Communication / Swallowing Assistance Needed: [name]- language  OT Problem List: Impaired balance (sitting and/or standing);Impaired vision/perception;Decreased safety awareness;Impaired UE functional use      OT Treatment/Interventions: Self-care/ADL training;DME and/or AE instruction;Visual/perceptual remediation/compensation;Patient/family education;Balance training    OT Goals(Current goals can be found in the care plan section) Acute Rehab OT Goals Patient Stated Goal: to go home ASAP OT Goal Formulation: With patient/family Time For Goal Achievement: 04/10/17 Potential to Achieve  Goals: Good ADL Goals Pt Will Perform Grooming: with supervision;standing Pt Will Transfer to Toilet: with supervision;ambulating (with SPC) Pt Will Perform Toileting - Clothing Manipulation and hygiene: with modified independence;sit to/from stand Pt Will Perform Tub/Shower Transfer: Tub transfer;with supervision;ambulating;with caregiver independent in assisting (with SPC) Pt/caregiver will Perform Home Exercise Program: Left upper extremity;With written HEP provided;Independently Additional ADL Goal #1: Pt will find 3 ADL items on the left side of the sink during grooming with multimodal cues at supervision level  OT Frequency: Min 2X/week   Barriers to D/C:            Co-evaluation              AM-PAC PT "6 Clicks" Daily Activity     Outcome Measure Help from another person eating meals?: A Little Help from another person taking care of personal grooming?: A Little Help from another person toileting, which includes using toliet, bedpan, or urinal?: A Little Help from another person bathing (including washing, rinsing, drying)?: A Little Help from another person to put on and taking off regular upper body clothing?: A Little Help from another person to put on and taking off regular lower body clothing?: A Little 6 Click Score: 18   End of Session Equipment Utilized During Treatment: Gait belt;Other (comment) [facility]) Nurse Communication: Mobility status  Activity Tolerance: Patient tolerated treatment well;Other (comment) (Pt seemed irritated at the end of the session due to testing) Patient left: in bed;with call bell/phone within reach;with family/visitor present  OT Visit Diagnosis: Unsteadiness on feet (R26.81);Other symptoms and signs involving the nervous system (R29.898)                Time: 7829-5621 OT Time Calculation (min): 21 min Charges:  OT General Charges $OT Visit: 1 Procedure OT Evaluation $OT Eval Moderate Complexity: 1 Procedure G-Codes: OT G-codes  **NOT FOR INPATIENT CLASS** Functional Assessment Tool Used: Clinical judgement Functional Limitation: Self care Self Care Current Status (H0865): At least 20 percent but less than 40 percent impaired, limited or restricted Self Care Goal Status (H8469): At least 1 percent but less than 20 percent impaired, limited or restricted   Sherryl Manges OTR/L [phone]  [name] 03/27/2017, 5:19 PM

## 2017-03-28 DIAGNOSIS — I6330 Cerebral infarction due to thrombosis of unspecified cerebral artery: Secondary | ICD-10-CM | POA: Insufficient documentation

## 2017-03-28 DIAGNOSIS — I63511 Cerebral infarction due to unspecified occlusion or stenosis of right middle cerebral artery: Secondary | ICD-10-CM | POA: Diagnosis not present

## 2017-03-28 DIAGNOSIS — R531 Weakness: Secondary | ICD-10-CM | POA: Diagnosis not present

## 2017-03-28 LAB — GLUCOSE, CAPILLARY
GLUCOSE-CAPILLARY: 96 mg/dL (ref 65–99)
GLUCOSE-CAPILLARY: 115 mg/dL — AB (ref 65–99)
GLUCOSE-CAPILLARY: 77 mg/dL (ref 65–99)

## 2017-03-28 LAB — VAS US CAROTID
LCCADDIAS: 16 cm/s
LCCADSYS: 52 cm/s
LCCAPDIAS: 17 cm/s
LCCAPSYS: 72 cm/s
LEFT ECA DIAS: -13 cm/s
LEFT VERTEBRAL DIAS: -12 cm/s
LICADSYS: -56 cm/s
Left ICA dist dias: -25 cm/s
Left ICA prox dias: -29 cm/s
Left ICA prox sys: -60 cm/s
RCCADSYS: -36 cm/s
RCCAPSYS: 53 cm/s
RIGHT ECA DIAS: -14 cm/s
RIGHT VERTEBRAL DIAS: -11 cm/s
Right CCA prox dias: 10 cm/s

## 2017-03-28 LAB — T3, FREE: T3, Free: 3.6 pg/mL (ref 2.0–4.4)

## 2017-03-28 LAB — HEMOGLOBIN A1C
HEMOGLOBIN A1C: 5.6 % (ref 4.8–5.6)
Mean Plasma Glucose: 114 mg/dL

## 2017-03-28 MED ORDER — ASPIRIN EC 81 MG PO TBEC
81.0000 mg | DELAYED_RELEASE_TABLET | Freq: Every day | ORAL | Status: DC
  Administered 2017-03-28: 81 mg via ORAL
  Filled 2017-03-28: qty 1

## 2017-03-28 MED ORDER — ATORVASTATIN CALCIUM 80 MG PO TABS: 80.0000 mg | ORAL_TABLET | Freq: Every day | ORAL | 0 refills | Status: DC

## 2017-03-28 MED ORDER — CLOPIDOGREL BISULFATE 75 MG PO TABS: 75.0000 mg | ORAL_TABLET | Freq: Every day | ORAL | 0 refills | Status: DC

## 2017-03-28 NOTE — Progress Notes (Signed)
Occupational Therapy Treatment Patient Details Name: [name] MRN: [id number] DOB: 22-Oct-1940 Today's Date: 03/28/2017    History of present illness 76 y.o. male admitted on 03/26/17 with left sided weakness and dizziness.  CT concerning for L MCA CVA and MRI is pending.  Pt with significant PMHx of HTN, DM.   OT comments  Pt willing to participate in functional ADL tasks and visual activties. Pt required increased time and multiple cues during sink level grooming (items placed on left and it took Pt auditory cues and significant increased time to look left to find items). Pt was able to scan and find items on tray during visual activity with highly structured environment. Pt continues to benefit from skilled OT in the acute setting and will require HHOT to maximize safety and independence in ADL and functional transfers to return to PLOF.   Follow Up Recommendations  Home health OT;Supervision/Assistance - 24 hour    Equipment Recommendations  3 in 1 bedside commode;Other (comment) (delivered in room during session)    Recommendations for Other Services      Precautions / Restrictions Precautions Precautions: Fall Precaution Comments: left inattention Restrictions Weight Bearing Restrictions: No       Mobility Bed Mobility Overal bed mobility: Needs Assistance Bed Mobility: Supine to Sit     Supine to sit: Supervision     General bed mobility comments: was able to get up on right side of bed with HOB flat and no rail.  Supervision for safety  Transfers Overall transfer level: Needs assistance Equipment used: Straight cane Transfers: Sit to/from Stand Sit to Stand: Min guard;Min assist         General transfer comment: Min guard assist for safety and balance.     Balance Overall balance assessment: Needs assistance Sitting-balance support: Feet supported;No upper extremity supported Sitting balance-Leahy Scale: Good Sitting balance - Comments: sat EOB for further  visual assessment   Standing balance support: Single extremity supported Standing balance-Leahy Scale: Poor Standing balance comment: need min guard assist in standing.                             ADL either performed or assessed with clinical judgement   ADL Overall ADL's : Needs assistance/impaired     Grooming: Wash/dry hands;Wash/dry face;Oral care;Min guard;Standing Grooming Details (indicate cue type and reason): sink level             Lower Body Dressing: Set up;Sit to/from stand Lower Body Dressing Details (indicate cue type and reason): to don sandles             Functional mobility during ADLs: Minimal assistance;Cane;Cueing for safety (Pt running into wall on left during in room mobility) General ADL Comments: Pt had trouble following directions and distracts easily     Vision   Vision Assessment?: Vision impaired- to be further tested in functional context;Yes Eye Alignment: Within Functional Limits Ocular Range of Motion: Impaired-to be further tested in functional context;Restricted on the left Alignment/Gaze Preference: Gaze right Visual Fields: Left visual field deficit;Impaired-to be further tested in functional context;Other (comment) Additional Comments: Pt able to track to left today to find ADL items on tray in front of Pt   Perception     Praxis      Cognition Arousal/Alertness: Awake/alert Behavior During Therapy: Flat affect Overall Cognitive Status: Difficult to assess Area of Impairment: Following commands  Following Commands: Follows one step commands with increased time       General Comments: Pt takes a very long time to follow directions, not sure if language or cognitive barrier        Exercises     Shoulder Instructions       General Comments Daughter present and functioning as translator    Pertinent Vitals/ Pain       Pain Assessment: No/denies pain Faces Pain Scale: No  hurt  Home Living     Available Help at Discharge: Family;Available 24 hours/day Type of Home: House                              Lives With: Daughter    Prior Functioning/Environment              Frequency  Min 2X/week        Progress Toward Goals  OT Goals(current goals can now be found in the care plan section)  Progress towards OT goals: Progressing toward goals  Acute Rehab OT Goals Patient Stated Goal: to go home ASAP OT Goal Formulation: With patient/family Time For Goal Achievement: 04/10/17 Potential to Achieve Goals: Good  Plan Discharge plan remains appropriate;Frequency remains appropriate    Co-evaluation                 AM-PAC PT "6 Clicks" Daily Activity     Outcome Measure   Help from another person eating meals?: A Little Help from another person taking care of personal grooming?: A Little Help from another person toileting, which includes using toliet, bedpan, or urinal?: A Little Help from another person bathing (including washing, rinsing, drying)?: A Little Help from another person to put on and taking off regular upper body clothing?: A Little Help from another person to put on and taking off regular lower body clothing?: A Little 6 Click Score: 18    End of Session Equipment Utilized During Treatment: Gait belt;Other (comment) (SPC)  OT Visit Diagnosis: Unsteadiness on feet (R26.81);Other symptoms and signs involving the nervous system (R29.898)   Activity Tolerance Patient tolerated treatment well   Patient Left in bed;with call bell/phone within reach;with family/visitor present   Nurse Communication Mobility status    Functional Assessment Tool Used: Clinical judgement Functional Limitation: Self care Self Care Current Status (Z6109(G8987): At least 20 percent but less than 40 percent impaired, limited or restricted Self Care Goal Status (U0454(G8988): At least 1 percent but less than 20 percent impaired, limited or  restricted   Time: [id number] OT Time Calculation (min): 22 min  Charges: OT G-codes **NOT FOR INPATIENT CLASS** Functional Assessment Tool Used: Clinical judgement Functional Limitation: Self care Self Care Current Status (N8295(G8987): At least 20 percent but less than 40 percent impaired, limited or restricted Self Care Goal Status (A2130(G8988): At least 1 percent but less than 20 percent impaired, limited or restricted OT General Charges $OT Visit: 1 Procedure OT Treatments $Self Care/Home Management : 8-22 mins  Sherryl MangesLaura Rawley Harju OTR/L [phone]  Evern BioLaura J Starlee Corralejo 03/28/2017, 2:57 PM

## 2017-03-28 NOTE — Discharge Summary (Signed)
Physician Discharge Summary  Devere Brem ZOX:[id number] DOB: 1941-04-24 DOA: 03/26/2017  PCP: Fleet Contras, MD  Admit date: 03/26/2017 Discharge date: 03/28/2017  Time spent: > 35 minutes  Recommendations for Outpatient Follow-up:  Reassess TSH in 4-6 weeks Ensure patient follows up with neurology Please make note of blood pressure goal listed below Patient is to be on dual antiplatelet therapy for the next 3 months please see instructions listed below, then is to be on Plavix alone  Discharge Diagnoses:  Active Problems:   HYPERTENSION, BENIGN ESSENTIAL   Diabetic complication (HCC)   Dizziness   Cerebral thrombosis with cerebral infarction   Discharge Condition: Stable  Diet recommendation: Carb modified diet  Filed Weights   03/26/17 2230  Weight: 65.7 kg (144 lb 14.4 oz)    History of present illness:  76 y.o. male with history of type II diabetes mellitus, dizziness, and hypertension who presented to [facility] ED complaining of left sided weakness (left eye, LUE, LLE), progressively worsening headache and progressively worsening dizzinessthat began on July 1. The patient did not receive IV T-PA secondary to him arriving outside the treatment window  Hospital Course:  Stroke - Neurology was consulted and performed stroke workup. These were the recommendations found on last neurology note:  Stroke:  Right MCA scattered infarcts due to intracranial large vessel stenosis including right M1 occlusion, left MCA and bilateral PCA high-grade stenosis.   Resultant  mild left arm pronator drift and dysmetria  CT head: Hyperdense left M2 branch possibly related to acute thrombosis versus atherosclerotic changes.   MRI head:  Right MCA scattered acute infarct, white matter infarcts and deep watershed pattern  MRA head: Right M1 occlusion, Advanced intracranial atherosclerosis with moderate bilateral ICA and proximal basilar stenosis. High-grade left MCA bifurcation and bilateral P2  segment stenoses.  Carotid Doppler: Unremarkable  2D Echo: EF 55-60%. No source of embolus  LDL 126  HgbA1c 5.6  SCDs for VTE prophylaxis  Diet Carb Modified Fluid consistency: Thin; Room service appropriate? Yes  aspirin 81 mg daily prior to admission, now on aspirin 81 mg daily and clopidogrel 75 mg daily. Continue DAPT for 3 months and then Plavix alone due to intracranial stenosis.  Patient and daughter counseled to be compliant with his antithrombotic medications  Ongoing aggressive stroke risk factor management  Therapy recommendations:  Home health OT;Supervision/Assistance - 24 hour  Disposition:  Likely home today Of note hemoglobin A1c within normal limits at 5.6  Intracranial stenosis  MRA head right M1 clot occlusion, left M1 high-grade stenosis, bilateral PCA high-grade stenosis  On DAPT for intracranial stenosis  BP goal 130-150 due to intracranial stenosis  Elevated TSH - Free T4 and T3 within normal limits as such will recommend patient have TSH reassessed within 4-6 weeks.  Hypertension - We'll continue prior to admission medication regimen with blood pressure goal listed above 130 to 150 systolic  Hyperlipidemia - LDL 126 with goal less than 70 added atorvastatin 80 mg by mouth daily this admission.  DM - We'll continue prior to admission medication regimen repeat hemoglobin A1c on that regimen 5.6  Procedures: Dg Chest 2 View 03/27/2017 IMPRESSION: No acute cardiopulmonary disease. Minimal aortic atherosclerosis.  Ct Head Wo Contrast 03/26/2017 IMPRESSION: No intracranial hemorrhage. Slightly hyperdense left middle cerebral artery M2 branch possibly related to acute thrombosis versus atherosclerotic changes. No other CT evidence of large acute infarct. Prominent chronic microvascular changes. This limits evaluation for detection of small or moderate-size infarct. Global atrophy.  Mri and Mra Brain  Wo Contrast (neuro  Protocol) 03/27/2017 IMPRESSION: 1. Right M1 occlusion with comparatively mild downstream white matter infarcts, deep watershed pattern. 2. Advanced intracranial atherosclerosis with moderate bilateral ICA and proximal basilar stenosis. High-grade left MCA bifurcation and bilateral P2 segment stenoses. 3. Advanced chronic small vessel ischemia in the cerebral white matter.  CUS - Bilateral: 1-39% ICA stenosis. Vertebral artery flow is antegrade.  TTE - Normal LV systolic function; mild diastolic dysfunction; mild LVH; mildly dilated aortic root (4.1 cm); sclerotic aortic valve with mild AI.   Consultations:  Neurology  Discharge Exam: Vitals:   03/28/17 0120 03/28/17 0637  BP: 133/83 (!) 167/95  Pulse: 69 78  Resp: 20 18  Temp: 97.6 F (36.4 C) 97.6 F (36.4 C)    General: Pt in nad, alert and awake Cardiovascular: rrr, no rubs Respiratory: no increased wob, no wheezes  Discharge Instructions   Discharge Instructions    Ambulatory referral to Neurology    Complete by:  As directed    Follow up with stroke clinic Darrol Angel(Carolyn Martin preferred, if not available, then consider Sylvie FarrierXu, [facility], [facility] or Lucia [facility]ask[facility] whoever is available[facility] about 6-8 weeks. Thanks.   Diet - low sodium heart healthy    Complete by:  As directed    Discharge instructions    Complete by:  As directed    Please ensure the patient continues aspirin and Plavix for the next 3 months as recommended by the neurologist. Culture the patient follows up with neurology as an outpatient. After the patient has taken aspirin and Plavix for 3 months he is to just continue Plavix alone. If any further questions arise regarding these recommendations please run them by your neurologist for clarification.   Increase activity slowly    Complete by:  As directed      Current Discharge Medication List    START taking these medications   Details  atorvastatin (LIPITOR) 80 MG tablet Take 1 tablet (80 mg total) by  mouth daily at 6 PM. Qty: 30 tablet, Refills: 0    clopidogrel (PLAVIX) 75 MG tablet Take 1 tablet (75 mg total) by mouth daily. Qty: 30 tablet, Refills: 0      CONTINUE these medications which have NOT CHANGED   Details  albuterol (PROVENTIL HFA;VENTOLIN HFA) 108 (90 Base) MCG/ACT inhaler Inhale 2 puffs into the lungs every 6 (six) hours as needed for wheezing or shortness of breath.    aspirin EC 81 MG tablet Take 81 mg by mouth daily.    glimepiride (AMARYL) 4 MG tablet Take 4 mg by mouth daily with breakfast.    loratadine (CLARITIN) 10 MG tablet Take 10 mg by mouth daily.     losartan-hydrochlorothiazide (HYZAAR) 100-12.5 MG tablet Take 1 tablet by mouth daily.    meclizine (ANTIVERT) 12.5 MG tablet Take 12.5 mg by mouth 3 (three) times daily as needed for dizziness.    metFORMIN (GLUCOPHAGE) 500 MG tablet Take 500 mg by mouth 2 (two) times daily with a meal.      STOP taking these medications     lisinopril-hydrochlorothiazide (PRINZIDE,ZESTORETIC) 20-12.5 MG per tablet        No Known Allergies Follow-up Information    Nilda RiggsMartin, Nancy Carolyn, NP. Schedule an appointment as soon as possible for a visit in 6 week(s).   Specialty:  Family Medicine Why:  Stroke follow up Contact information: [address] [place] [address] [phone]            The  results of significant diagnostics from this hospitalization (including imaging, microbiology, ancillary and laboratory) are listed below for reference.    Significant Diagnostic Studies: Dg Chest 2 View  Result Date: 03/27/2017 CLINICAL DATA:  Dizziness and left-sided weakness. EXAM: CHEST  2 VIEW COMPARISON:  02/21/2012 FINDINGS: Lungs are adequately inflated without focal consolidation or effusion. Patient slightly rotated to the left. Cardiomediastinal silhouette is within normal. Subtle calcified plaque over the aortic arch. Mild degenerate change of the spine. IMPRESSION: No acute cardiopulmonary  disease. Minimal aortic atherosclerosis. Electronically Signed   By: Elberta Fortis M.D.   On: 03/27/2017 07:50   Ct Head Wo Contrast  Result Date: 03/26/2017 CLINICAL DATA:  76 year old hypertensive male with dizziness since last night. Bilateral arm and leg numbness. Facial droop this morning. EXAM: CT HEAD WITHOUT CONTRAST TECHNIQUE: Contiguous axial images were obtained from the base of the skull through the vertex without intravenous contrast. COMPARISON:  None. FINDINGS: Brain: No intracranial hemorrhage. Slightly hyperdense left middle cerebral artery M2 branch possibly related to acute thrombosis versus atherosclerotic changes. No other CT evidence of large acute infarct. Prominent chronic microvascular changes. This limits evaluation for detection of small or moderate-size infarct. Global atrophy. No intracranial mass lesion noted on this unenhanced exam. Vascular: Vascular calcifications in addition to above findings. Skull: No acute abnormality. Sinuses/Orbits: No acute orbital abnormality. Minimal mucosal thickening right maxillary sinus and ethmoid sinus air cells. Other: Mastoid air cells and middle ear cavities are clear. IMPRESSION: No intracranial hemorrhage. Slightly hyperdense left middle cerebral artery M2 branch possibly related to acute thrombosis versus atherosclerotic changes. No other CT evidence of large acute infarct. Prominent chronic microvascular changes. This limits evaluation for detection of small or moderate-size infarct. Global atrophy. Electronically Signed   By: Lacy Duverney M.D.   On: 03/26/2017 19:07   Mr Brain Wo Contrast (neuro Protocol)  Result Date: 03/27/2017 CLINICAL DATA:  Left-sided weakness. EXAM: MRI HEAD WITHOUT CONTRAST MRA HEAD WITHOUT CONTRAST TECHNIQUE: Multiplanar, multiecho pulse sequences of the brain and surrounding structures were obtained without intravenous contrast. Angiographic images of the head were obtained using MRA technique without contrast.  COMPARISON:  Head CT from yesterday FINDINGS: MRI HEAD FINDINGS Brain: Moderate degree of patchy acute infarct in the right cerebral white matter, roughly aligned along the ventricular margin, MCA deep watershed pattern. At least 1 tiny cortical infarct is present along the frontal operculum. No separate distribution infarct noted. No hemorrhagic conversion. There is a background of extensive chronic microvascular ischemic gliosis in the cerebral white matter. No hydrocephalus or masslike findings. On SWI, asymmetric prominence of right sided sulcal vessels is likely from slow flow and deoxygenation of blood in the setting of proximal MCA occlusion and presumed extensive pial pial collaterals. Patient is at least 3 days post ictus. Vascular: Abnormal right M1 segment flow void, see below. Skull and upper cervical spine: Negative for marrow lesion. Sinuses/Orbits: No acute finding MRA HEAD FINDINGS Comparatively small right ICA with less intense enhancement, with moderate stenosis at the anterior genu cavernous segment. Right M1 cut off. There is moderate narrowing of the proximal right A1 segment. Left A3 segment moderate to advanced stenosis. The left supraclinoid ICA is moderately narrowed. There is early branching left MCA with high-grade stenosis at the bifurcation. Left dominant vertebral artery. Proximal basilar stenosis due to shelf-like plaque, moderate narrowing. There is high-grade narrowing of bilateral P2 segments, worse on the left. Negative for aneurysm. Case discussed with Dr. Amada Jupiter at time of interpretation. IMPRESSION: 1. Right M1  occlusion with comparatively mild downstream white matter infarcts, deep watershed pattern. 2. Advanced intracranial atherosclerosis with moderate bilateral ICA and proximal basilar stenosis. High-grade left MCA bifurcation and bilateral P2 segment stenoses. 3. Advanced chronic small vessel ischemia in the cerebral white matter. Electronically Signed   By: Marnee Spring M.D.   On: 03/27/2017 19:04   Mr Maxine Glenn Head/brain ZH Cm  Result Date: 03/27/2017 CLINICAL DATA:  Left-sided weakness. EXAM: MRI HEAD WITHOUT CONTRAST MRA HEAD WITHOUT CONTRAST TECHNIQUE: Multiplanar, multiecho pulse sequences of the brain and surrounding structures were obtained without intravenous contrast. Angiographic images of the head were obtained using MRA technique without contrast. COMPARISON:  Head CT from yesterday FINDINGS: MRI HEAD FINDINGS Brain: Moderate degree of patchy acute infarct in the right cerebral white matter, roughly aligned along the ventricular margin, MCA deep watershed pattern. At least 1 tiny cortical infarct is present along the frontal operculum. No separate distribution infarct noted. No hemorrhagic conversion. There is a background of extensive chronic microvascular ischemic gliosis in the cerebral white matter. No hydrocephalus or masslike findings. On SWI, asymmetric prominence of right sided sulcal vessels is likely from slow flow and deoxygenation of blood in the setting of proximal MCA occlusion and presumed extensive pial pial collaterals. Patient is at least 3 days post ictus. Vascular: Abnormal right M1 segment flow void, see below. Skull and upper cervical spine: Negative for marrow lesion. Sinuses/Orbits: No acute finding MRA HEAD FINDINGS Comparatively small right ICA with less intense enhancement, with moderate stenosis at the anterior genu cavernous segment. Right M1 cut off. There is moderate narrowing of the proximal right A1 segment. Left A3 segment moderate to advanced stenosis. The left supraclinoid ICA is moderately narrowed. There is early branching left MCA with high-grade stenosis at the bifurcation. Left dominant vertebral artery. Proximal basilar stenosis due to shelf-like plaque, moderate narrowing. There is high-grade narrowing of bilateral P2 segments, worse on the left. Negative for aneurysm. Case discussed with Dr. Amada Jupiter at time of  interpretation. IMPRESSION: 1. Right M1 occlusion with comparatively mild downstream white matter infarcts, deep watershed pattern. 2. Advanced intracranial atherosclerosis with moderate bilateral ICA and proximal basilar stenosis. High-grade left MCA bifurcation and bilateral P2 segment stenoses. 3. Advanced chronic small vessel ischemia in the cerebral white matter. Electronically Signed   By: Marnee Spring M.D.   On: 03/27/2017 19:04    Microbiology: No results found for this or any previous visit (from the past 240 hour(s)).   Labs: Basic Metabolic Panel:  Recent Labs Lab 03/26/17 1823 03/26/17 1846 03/27/17 0355  NA 136 140 138  K 3.8 3.7 3.7  CL 102 102 105  CO2 24  --  22  GLUCOSE 106* 105* 103*  BUN <5* 6 5*  CREATININE 0.82 0.70 0.69  CALCIUM 9.8  --  9.5   Liver Function Tests:  Recent Labs Lab 03/26/17 1823 03/27/17 0355  AST 42* 38  ALT 36 32  ALKPHOS 58 51  BILITOT 1.6* 2.2*  PROT 8.3* 7.8  ALBUMIN 4.1 3.8   No results for input(s): LIPASE, AMYLASE in the last 168 hours. No results for input(s): AMMONIA in the last 168 hours. CBC:  Recent Labs Lab 03/26/17 1823 03/26/17 1846  WBC 9.4  --   NEUTROABS 5.2  --   HGB 16.8 17.3*  HCT 47.4 51.0  MCV 93.5  --   PLT 185  --    Cardiac Enzymes: No results for input(s): CKTOTAL, CKMB, CKMBINDEX, TROPONINI in the last 168 hours.  BNP: BNP (last 3 results) No results for input(s): BNP in the last 8760 hours.  ProBNP (last 3 results) No results for input(s): PROBNP in the last 8760 hours.  CBG:  Recent Labs Lab 03/27/17 1122 03/27/17 1620 03/27/17 2121 03/28/17 0635 03/28/17 1123  GLUCAP 86 107* 73 96 115*    Signed:  Penny Pia MD.  [facility] 03/28/2017, 3:48 PM

## 2017-03-28 NOTE — Evaluation (Signed)
Speech Language Pathology Evaluation Patient Details Name: [name] MRN: [id number] DOB: 12/05/1940 Today's Date: 03/28/2017 Time: [id number] SLP Time Calculation (min) (ACUTE ONLY): 21 min  Problem List:  Patient Active Problem List   Diagnosis Date Noted  . 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 Diabetic complication (HCC) 03/26/2017  . Dizziness 03/26/2017  . Postop check 03/11/2012  . HYDROCELE, RIGHT 08/28/2010  . NOCTURIA 08/28/2010  . OTH&UNSPEC NONINFECTIOUS GASTROENTERITIS&COLITIS 06/29/2010  . ABDOMINAL PAIN-RLQ 06/29/2010  . ABNORMAL FINDINGS GI TRACT 06/29/2010  . HYPERTENSION, BENIGN ESSENTIAL 05/02/2010  . LEUKOCYTOSIS 04/20/2010  . COLITIS, HX OF 04/20/2010   Past Medical History:  Past Medical History:  Diagnosis Date  . Diabetes (HCC)   . Dizziness   . Hypertension    Past Surgical History:  Past Surgical History:  Procedure Laterality Date  . LAPAROSCOPIC APPENDECTOMY  02/21/2012   Procedure: APPENDECTOMY LAPAROSCOPIC;  Surgeon: Cherylynn RidgesJames O Wyatt, MD;  [facility];  Service: General;  Laterality: N/A;   HPI:  76 y.o. male admitted on 03/26/17 with left sided weakness and dizziness.  CT concerning for L MCA CVA and MRI is pending.  Pt with significant PMHx of HTN, DM.   Assessment / Plan / Recommendation Clinical Impression  Cognitive evaluation was limited as pt currently did "not want to talk", but his daughter does share that his speech is not as clear as it normally is. Per her description, it appears as though he has at least a mild dysarthria. Pt participated in brief functional tasks with cueing needed for basic problem solving and left-sided awareness. Based on brief evaluation, [facility] and 24/7 supervision upon return home.     SLP Assessment  SLP Recommendation/Assessment: Patient needs continued Speech Lanaguage Pathology Services SLP Visit Diagnosis: Cognitive communication deficit  (R41.841);Dysarthria and anarthria (R47.1)    Follow Up Recommendations  Home health SLP;24 hour supervision/assistance    Frequency and Duration min 2x/week  2 weeks      SLP Evaluation Cognition  Overall Cognitive Status: Impaired/Different from baseline Arousal/Alertness: Awake/alert Orientation Level: Oriented to person;Oriented to place;Oriented to time Awareness: Impaired Awareness Impairment: Emergent impairment Problem Solving: Impaired Problem Solving Impairment: Functional basic Safety/Judgment: Impaired       Comprehension  Auditory Comprehension Overall Auditory Comprehension: Other (comment) (dififcult to assess)    Expression Expression Primary Mode of Expression: Verbal Verbal Expression Overall Verbal Expression: Other (comment) (difficult to assess - daughter denies)   Oral / Sales executiveMotor  Motor Speech Overall Motor Speech: Impaired (per daughter)   GO          Functional Assessment Tool Used: skilled clinical judgment Functional Limitations: Motor speech Motor Speech Current Status [phone](G8999): At least 20 percent but less than 40 percent impaired, limited or restricted Motor Speech Goal Status [phone](G9186): At least 1 percent but less than 20 percent impaired, limited or restricted         Maxcine Hamaiewonsky, Rozalia Dino 03/28/2017, 1:56 PM  Maxcine HamLaura Paiewonsky, M.A. CCC-SLP [phone]

## 2017-03-28 NOTE — Progress Notes (Signed)
STROKE TEAM PROGRESS NOTE   HISTORY OF PRESENT ILLNESS (per record) [name] is a Nepalese-speaking 76 y.o. male with a history of type II diabetes mellitus, dizziness, and hypertension who presented to [facility] ED complaining of left sided weakness (left eye, LUE, LLE), progressively worsening headache and progressively worsening dizziness that began on July 1.  He does not endorse any sensory changes.  He stated that he woke from sleeping with these symptoms. [name] said that the dizziness made him feel as though he was going to fall over, but could not articulate if this was a room-spinning or light-headed feeling.  All symptoms lasted three days, approximately until he arrived at [facility].  Presently, he is without left sided weakness, dizziness or headache. He would like to go home.  Per the daughter, he has had progressive weakness and dizziness since 2015 on 03/26/2017 . At baseline, he is mostly chair-bound, walking only occasionally around the home. The daughter controls his medications and states that he has not missed any doses.  CT head 7/3: No intracranial hemorrhage. Slightly hyperdense left middle cerebral artery M2 branch possibly related to acute thrombosis versus atherosclerotic changes. No other CT evidence of large acute infarct. Prominent chronic microvascular changes.  TSH: 9.91   Patient was not administered IV t-PA secondary to arriving outside of the treatment window. He was admitted to General Neurology for further evaluation and treatment.   SUBJECTIVE (INTERVAL HISTORY) His daughter is at the bedside. Daughter acts as the interpreter. The patient is awake, alert, and follows all commands appropriately. PT OT recommended home health. Daughter also suggested for checking patient BP at home closely.   OBJECTIVE Temp:  [97.4 F (36.3 C)-97.6 F (36.4 C)] 97.6 F (36.4 C) (07/05 0637) Pulse Rate:  [57-91] 78 (07/05 0637) Cardiac Rhythm: Normal sinus rhythm (07/04 2010) Resp:   [16-20] 18 (07/05 0637) BP: (130-167)/(50-98) 167/95 (07/05 0637) SpO2:  [92 %-98 %] 98 % (07/05 0637)  CBC:  Recent Labs Lab 03/26/17 1823 03/26/17 1846  WBC 9.4  --   NEUTROABS 5.2  --   HGB 16.8 17.3*  HCT 47.4 51.0  MCV 93.5  --   PLT 185  --     Basic Metabolic Panel:  Recent Labs Lab 03/26/17 1823 03/26/17 1846 03/27/17 0355  NA 136 140 138  K 3.8 3.7 3.7  CL 102 102 105  CO2 24  --  22  GLUCOSE 106* 105* 103*  BUN <5* 6 5*  CREATININE 0.82 0.70 0.69  CALCIUM 9.8  --  9.5    Lipid Panel:    Component Value Date/Time   CHOL 187 03/27/2017 0355   TRIG 97 03/27/2017 0355   HDL 42 03/27/2017 0355   CHOLHDL 4.5 03/27/2017 0355   VLDL 19 03/27/2017 0355   LDLCALC 126 (H) 03/27/2017 0355   HgbA1c:  Lab Results  Component Value Date   HGBA1C 5.6 03/27/2017   Urine Drug Screen: No results found for: LABOPIA, COCAINSCRNUR, LABBENZ, AMPHETMU, THCU, LABBARB  Alcohol Level No results found for: [facility]  IMAGING I have personally reviewed the radiological images below and agree with the radiology interpretations.  Dg Chest 2 View 03/27/2017 IMPRESSION: No acute cardiopulmonary disease. Minimal aortic atherosclerosis.  Ct Head Wo Contrast 03/26/2017 IMPRESSION: No intracranial hemorrhage. Slightly hyperdense left middle cerebral artery M2 branch possibly related to acute thrombosis versus atherosclerotic changes. No other CT evidence of large acute infarct. Prominent chronic microvascular changes. This limits evaluation for detection of small or moderate-size  infarct. Global atrophy.  Mri and Mra Brain Wo Contrast (neuro Protocol) 03/27/2017 IMPRESSION: 1. Right M1 occlusion with comparatively mild downstream white matter infarcts, deep watershed pattern. 2. Advanced intracranial atherosclerosis with moderate bilateral ICA and proximal basilar stenosis. High-grade left MCA bifurcation and bilateral P2 segment stenoses. 3. Advanced chronic small vessel ischemia in the  cerebral white matter.  CUS - Bilateral: 1-39% ICA stenosis. Vertebral artery flow is antegrade.  TTE - Normal LV systolic function; mild diastolic dysfunction; mild   LVH; mildly dilated aortic root (4.1 cm); sclerotic aortic valve   with mild AI.   PHYSICAL EXAM  Temp:  [97.4 F (36.3 C)-97.6 F (36.4 C)] 97.6 F (36.4 C) (07/05 0637) Pulse Rate:  [57-88] 78 (07/05 0637) Resp:  [16-20] 18 (07/05 0637) BP: (130-167)/(50-98) 167/95 (07/05 0637) SpO2:  [92 %-98 %] 98 % (07/05 0637)  General - Well nourished, well developed, in no apparent distress.  Ophthalmologic - Fundi not visualized due to noncooperation.  Cardiovascular - Regular rate and rhythm.  Mental Status -  Level of arousal and orientation to time, place, and person were intact. Language including expression, naming, comprehension was assessed and found intact.  Cranial Nerves II - XII - II - Visual field grossly intact OU although patient not quite cooperative with psychological impersistence. III, IV, VI - Extraocular movements intact. V - Facial sensation intact bilaterally. VII - Facial movement intact bilaterally. VIII - Hearing & vestibular intact bilaterally. X - Palate elevates symmetrically. XI - Chin turning & shoulder shrug intact bilaterally. XII - Tongue protrusion intact.  Motor Strength - The patient's strength was normal in all extremities except left pronator drift was present.  Bulk was normal and fasciculations were absent.   Motor Tone - Muscle tone was assessed at the neck and appendages and was normal.  Reflexes - The patient's reflexes were 1+ in all extremities and he had no pathological reflexes.  Sensory - Light touch, temperature/pinprick were assessed and were symmetrical.    Coordination - The patient had normal movements in the right hands, however, left hand with mild dysmetria on FTN.  Tremor was absent.  Gait and Station - deferred.   ASSESSMENT/PLAN [name] is  a 76 y.o. male with history of type II diabetes mellitus, dizziness, and hypertension who presented to [facility] ED complaining of left sided weakness (left eye, LUE, LLE), progressively worsening headache and progressively worsening dizziness that began on July 1. He did not receive IV t-PA due to arriving outside of the treatment window.   Stroke:  Right MCA scattered infarcts due to intracranial large vessel stenosis including right M1 occlusion, left MCA and bilateral PCA high-grade stenosis.   Resultant  mild left arm pronator drift and dysmetria  CT head: Hyperdense left M2 branch possibly related to acute thrombosis versus atherosclerotic changes.   MRI head:  Right MCA scattered acute infarct, white matter infarcts and deep watershed pattern  MRA head: Right M1 occlusion, Advanced intracranial atherosclerosis with moderate bilateral ICA and proximal basilar stenosis. High-grade left MCA bifurcation and bilateral P2 segment stenoses.  Carotid Doppler: Unremarkable  2D Echo: EF 55-60%. No source of embolus  LDL 126  HgbA1c 5.6  SCDs for VTE prophylaxis  Diet Carb Modified Fluid consistency: Thin; Room service appropriate? Yes  aspirin 81 mg daily prior to admission, now on aspirin 81 mg daily and clopidogrel 75 mg daily. Continue DAPT for 3 months and then Plavix alone due to intracranial stenosis.  Patient and daughter counseled  to be compliant with his antithrombotic medications  Ongoing aggressive stroke risk factor management  Therapy recommendations:  Home health OT;Supervision/Assistance - 24 hour   Disposition:  Likely home today  Intracranial stenosis  MRA head right M1 clot occlusion, left M1 high-grade stenosis, bilateral PCA high-grade stenosis  On DAPT for intracranial stenosis  BP goal 130-150 due to intracranial stenosis  Hypertension  Stable  Permissive hypertension (OK if < 220/120) but gradually normalize in 5-7 days  Long-term BP goal  130-150  Hyperlipidemia  Home meds:  none  LDL 126, goal < 70  Add atorvastatin 80mg  PO daily  Continue statin at discharge  DM  Home meds metformin and Amaryl  A1c pending, goal < 7.0  Metformin and Amaryl resumed  SSI  CBG monitoring  Other Stroke Risk Factors  Advanced age  Other Active Problems  None  Hospital day # 0  Neurology will sign off. Please call with questions. Pt [facility]. Thanks for the consult.  Marvel PlanJindong Marelly Wehrman, MD PhD Stroke Neurology 03/28/2017 2:57 PM   To contact Stroke Continuity provider, please refer to [email or web address]. After hours, contact General Neurology

## 2017-03-28 NOTE — Progress Notes (Signed)
Pt discharge education and instructions completed with pt and family at bedside with daughter interpreting to pt. All voices understanding and denied any questions. Pt IV and telemetry removed; pt handed his prescription for Lipitor and will pick up electronically sent Plavix from preferred pharmacy on file. Pt home DME 3N1 delivered to pt at bedside. Pt discharge home with daughter to transport him home. Pt transported off unit via wheelchair along with his belongings including cane and family at the side. Dionne BucyP. Amo Trenton Verne RN

## 2017-03-28 NOTE — Care Management Note (Signed)
Case Management Note  Patient Details  Name: [name] MRN: [id number] Date of Birth: [birth year]  Subjective/Objective:                    Action/Plan: Pt discharging home with family and orders for [facility] services. CM met with the patient and his daughter and provided a list of [facility] agencies. They selected [place]. Jermaine with [facility] notified and accepted the referral. Pt with orders for 3 n 1. Jermaine with [facility] delivered the equipment to the room. Family to provide transportation home.   Expected Discharge Plan:  [place]  In-House Referral:     Discharge planning Services  CM Consult  Post Acute Care Choice:  Durable Medical Equipment, Home Health Choice offered to:  Adult Children  DME Arranged:  3-N-1 DME Agency:  [place] Arranged:  PT, OT [place] Agency:  [place]  Status of Service:  Completed, signed off  If discussed at [place] of Stay Meetings, dates discussed:    Additional Comments:  Pollie Friar, RN 03/28/2017, 4:34 PM

## 2017-03-29 LAB — FANA STAINING PATTERNS: Homogeneous Pattern: 1:160 {titer} — ABNORMAL HIGH

## 2017-03-29 LAB — ANTINUCLEAR ANTIBODIES, IFA: ANA Ab, IFA: POSITIVE — AB

## 2017-04-02 ENCOUNTER — Encounter (HOSPITAL_COMMUNITY): Payer: Self-pay

## 2017-04-02 ENCOUNTER — Emergency Department (HOSPITAL_COMMUNITY): Payer: Medicaid Other

## 2017-04-02 ENCOUNTER — Inpatient Hospital Stay (HOSPITAL_COMMUNITY)
Admission: EM | Admit: 2017-04-02 | Discharge: 2017-04-05 | DRG: 064 | Disposition: A | Discharge status: Rehabilitation Facility | Payer: Medicaid Other | Attending: Internal Medicine | Admitting: Internal Medicine

## 2017-04-02 ENCOUNTER — Inpatient Hospital Stay (HOSPITAL_COMMUNITY): Payer: Medicaid Other

## 2017-04-02 ENCOUNTER — Other Ambulatory Visit: Payer: Self-pay

## 2017-04-02 DIAGNOSIS — R4020 Unspecified coma: Secondary | ICD-10-CM | POA: Diagnosis not present

## 2017-04-02 DIAGNOSIS — R410 Disorientation, unspecified: Secondary | ICD-10-CM

## 2017-04-02 DIAGNOSIS — I63011 Cerebral infarction due to thrombosis of right vertebral artery: Secondary | ICD-10-CM | POA: Diagnosis not present

## 2017-04-02 DIAGNOSIS — R946 Abnormal results of thyroid function studies: Secondary | ICD-10-CM | POA: Diagnosis present

## 2017-04-02 DIAGNOSIS — E663 Overweight: Secondary | ICD-10-CM | POA: Diagnosis present

## 2017-04-02 DIAGNOSIS — F329 Major depressive disorder, single episode, unspecified: Secondary | ICD-10-CM | POA: Diagnosis not present

## 2017-04-02 DIAGNOSIS — N179 Acute kidney failure, unspecified: Secondary | ICD-10-CM | POA: Diagnosis not present

## 2017-04-02 DIAGNOSIS — E785 Hyperlipidemia, unspecified: Secondary | ICD-10-CM | POA: Diagnosis present

## 2017-04-02 DIAGNOSIS — E039 Hypothyroidism, unspecified: Secondary | ICD-10-CM | POA: Diagnosis present

## 2017-04-02 DIAGNOSIS — R1312 Dysphagia, oropharyngeal phase: Secondary | ICD-10-CM | POA: Diagnosis not present

## 2017-04-02 DIAGNOSIS — I6602 Occlusion and stenosis of left middle cerebral artery: Secondary | ICD-10-CM | POA: Diagnosis present

## 2017-04-02 DIAGNOSIS — R4182 Altered mental status, unspecified: Secondary | ICD-10-CM | POA: Diagnosis not present

## 2017-04-02 DIAGNOSIS — E119 Type 2 diabetes mellitus without complications: Secondary | ICD-10-CM

## 2017-04-02 DIAGNOSIS — I651 Occlusion and stenosis of basilar artery: Secondary | ICD-10-CM | POA: Diagnosis present

## 2017-04-02 DIAGNOSIS — E538 Deficiency of other specified B group vitamins: Secondary | ICD-10-CM | POA: Diagnosis present

## 2017-04-02 DIAGNOSIS — I69354 Hemiplegia and hemiparesis following cerebral infarction affecting left non-dominant side: Secondary | ICD-10-CM | POA: Diagnosis not present

## 2017-04-02 DIAGNOSIS — F17210 Nicotine dependence, cigarettes, uncomplicated: Secondary | ICD-10-CM | POA: Diagnosis present

## 2017-04-02 DIAGNOSIS — I6523 Occlusion and stenosis of bilateral carotid arteries: Secondary | ICD-10-CM | POA: Diagnosis present

## 2017-04-02 DIAGNOSIS — E0800 Diabetes mellitus due to underlying condition with hyperosmolarity without nonketotic hyperglycemic-hyperosmolar coma (NKHHC): Secondary | ICD-10-CM | POA: Diagnosis not present

## 2017-04-02 DIAGNOSIS — I63511 Cerebral infarction due to unspecified occlusion or stenosis of right middle cerebral artery: Secondary | ICD-10-CM | POA: Diagnosis present

## 2017-04-02 DIAGNOSIS — Z7982 Long term (current) use of aspirin: Secondary | ICD-10-CM

## 2017-04-02 DIAGNOSIS — G92 Toxic encephalopathy: Secondary | ICD-10-CM | POA: Diagnosis present

## 2017-04-02 DIAGNOSIS — I10 Essential (primary) hypertension: Secondary | ICD-10-CM | POA: Diagnosis present

## 2017-04-02 DIAGNOSIS — E860 Dehydration: Secondary | ICD-10-CM | POA: Diagnosis present

## 2017-04-02 DIAGNOSIS — G8194 Hemiplegia, unspecified affecting left nondominant side: Secondary | ICD-10-CM | POA: Diagnosis not present

## 2017-04-02 DIAGNOSIS — R1310 Dysphagia, unspecified: Secondary | ICD-10-CM | POA: Diagnosis present

## 2017-04-02 DIAGNOSIS — R29704 NIHSS score 4: Secondary | ICD-10-CM | POA: Diagnosis present

## 2017-04-02 DIAGNOSIS — Z79899 Other long term (current) drug therapy: Secondary | ICD-10-CM

## 2017-04-02 DIAGNOSIS — R29810 Facial weakness: Secondary | ICD-10-CM | POA: Diagnosis present

## 2017-04-02 DIAGNOSIS — I672 Cerebral atherosclerosis: Secondary | ICD-10-CM | POA: Diagnosis present

## 2017-04-02 DIAGNOSIS — I69319 Unspecified symptoms and signs involving cognitive functions following cerebral infarction: Secondary | ICD-10-CM | POA: Diagnosis not present

## 2017-04-02 DIAGNOSIS — I69398 Other sequelae of cerebral infarction: Secondary | ICD-10-CM | POA: Diagnosis not present

## 2017-04-02 DIAGNOSIS — Z7902 Long term (current) use of antithrombotics/antiplatelets: Secondary | ICD-10-CM | POA: Diagnosis not present

## 2017-04-02 DIAGNOSIS — R7401 Elevation of levels of liver transaminase levels: Secondary | ICD-10-CM

## 2017-04-02 DIAGNOSIS — Z7984 Long term (current) use of oral hypoglycemic drugs: Secondary | ICD-10-CM

## 2017-04-02 DIAGNOSIS — N390 Urinary tract infection, site not specified: Secondary | ICD-10-CM | POA: Diagnosis present

## 2017-04-02 DIAGNOSIS — R400 Somnolence: Secondary | ICD-10-CM | POA: Diagnosis not present

## 2017-04-02 DIAGNOSIS — I639 Cerebral infarction, unspecified: Secondary | ICD-10-CM | POA: Diagnosis present

## 2017-04-02 DIAGNOSIS — Z6822 Body mass index (BMI) 22.0-22.9, adult: Secondary | ICD-10-CM

## 2017-04-02 DIAGNOSIS — R740 Nonspecific elevation of levels of transaminase and lactic acid dehydrogenase [LDH]: Secondary | ICD-10-CM | POA: Diagnosis present

## 2017-04-02 DIAGNOSIS — R269 Unspecified abnormalities of gait and mobility: Secondary | ICD-10-CM | POA: Diagnosis not present

## 2017-04-02 LAB — COMPREHENSIVE METABOLIC PANEL
ALK PHOS: 69 U/L (ref 38–126)
ALT: 82 U/L — AB (ref 17–63)
AST: 97 U/L — AB (ref 15–41)
Albumin: 4.6 g/dL (ref 3.5–5.0)
Anion gap: 18 — ABNORMAL HIGH (ref 5–15)
BUN: 34 mg/dL — AB (ref 6–20)
CALCIUM: 10.3 mg/dL (ref 8.9–10.3)
CHLORIDE: 102 mmol/L (ref 101–111)
CO2: 19 mmol/L — AB (ref 22–32)
CREATININE: 2.04 mg/dL — AB (ref 0.61–1.24)
GFR calc non Af Amer: 30 mL/min — ABNORMAL LOW (ref >60)
GFR, EST AFRICAN AMERICAN: 35 mL/min — AB (ref >60)
GLUCOSE: 94 mg/dL (ref 65–99)
Potassium: 4.9 mmol/L (ref 3.5–5.1)
SODIUM: 139 mmol/L (ref 135–145)
Total Bilirubin: 2.9 mg/dL — ABNORMAL HIGH (ref 0.3–1.2)
Total Protein: 9.4 g/dL — ABNORMAL HIGH (ref 6.5–8.1)

## 2017-04-02 LAB — CBC WITH DIFFERENTIAL/PLATELET
BASOS PCT: 1 %
Basophils Absolute: 0.1 10*3/uL (ref 0.0–0.1)
EOS ABS: 0.1 10*3/uL (ref 0.0–0.7)
EOS PCT: 1 %
HCT: 55 % — ABNORMAL HIGH (ref 39.0–52.0)
HEMOGLOBIN: 19.5 g/dL — AB (ref 13.0–17.0)
LYMPHS ABS: 1.7 10*3/uL (ref 0.7–4.0)
Lymphocytes Relative: 17 %
MCH: 33.7 pg (ref 26.0–34.0)
MCHC: 35.5 g/dL (ref 30.0–36.0)
MCV: 95.2 fL (ref 78.0–100.0)
MONO ABS: 1.1 10*3/uL — AB (ref 0.1–1.0)
MONOS PCT: 11 %
NEUTROS PCT: 71 %
Neutro Abs: 6.9 10*3/uL (ref 1.7–7.7)
PLATELETS: 205 10*3/uL (ref 150–400)
RBC: 5.78 MIL/uL (ref 4.22–5.81)
RDW: 13.1 % (ref 11.5–15.5)
WBC: 9.8 10*3/uL (ref 4.0–10.5)

## 2017-04-02 LAB — I-STAT CHEM 8, ED
BUN: 37 mg/dL — AB (ref 6–20)
CREATININE: 1.6 mg/dL — AB (ref 0.61–1.24)
Calcium, Ion: 1.15 mmol/L (ref 1.15–1.40)
Chloride: 106 mmol/L (ref 101–111)
Glucose, Bld: 84 mg/dL (ref 65–99)
HEMATOCRIT: 53 % — AB (ref 39.0–52.0)
HEMOGLOBIN: 18 g/dL — AB (ref 13.0–17.0)
POTASSIUM: 4.5 mmol/L (ref 3.5–5.1)
SODIUM: 140 mmol/L (ref 135–145)
TCO2: 19 mmol/L (ref 0–100)

## 2017-04-02 LAB — URINALYSIS, COMPLETE (UACMP) WITH MICROSCOPIC
Bilirubin Urine: NEGATIVE
GLUCOSE, UA: NEGATIVE mg/dL
KETONES UR: 80 mg/dL — AB
NITRITE: NEGATIVE
PH: 5 (ref 5.0–8.0)
Protein, ur: NEGATIVE mg/dL
Specific Gravity, Urine: 1.015 (ref 1.005–1.030)

## 2017-04-02 LAB — PROTIME-INR
INR: 1.03
PROTHROMBIN TIME: 13.5 s (ref 11.4–15.2)

## 2017-04-02 LAB — APTT: aPTT: 33 seconds (ref 24–36)

## 2017-04-02 LAB — GLUCOSE, CAPILLARY
GLUCOSE-CAPILLARY: 88 mg/dL (ref 65–99)
GLUCOSE-CAPILLARY: 86 mg/dL (ref 65–99)

## 2017-04-02 LAB — I-STAT CG4 LACTIC ACID, ED: Lactic Acid, Venous: 1.77 mmol/L (ref 0.5–1.9)

## 2017-04-02 LAB — I-STAT TROPONIN, ED: Troponin i, poc: 0 ng/mL (ref 0.00–0.08)

## 2017-04-02 LAB — ETHANOL: Alcohol, Ethyl (B): <5 mg/dL (ref <5)

## 2017-04-02 LAB — CBG MONITORING, ED
GLUCOSE-CAPILLARY: 93 mg/dL (ref 65–99)
Glucose-Capillary: 94 mg/dL (ref 65–99)

## 2017-04-02 MED ORDER — ACETAMINOPHEN 650 MG RE SUPP: 650.0000 mg | RECTAL | Status: DC | PRN

## 2017-04-02 MED ORDER — LORAZEPAM 2 MG/ML IJ SOLN
1.0000 mg | Freq: Once | INTRAMUSCULAR | Status: AC
  Administered 2017-04-02: 1 mg via INTRAVENOUS
  Filled 2017-04-02: qty 1

## 2017-04-02 MED ORDER — ACETAMINOPHEN 160 MG/5ML PO SOLN: 650.0000 mg | ORAL | Status: DC | PRN

## 2017-04-02 MED ORDER — ENOXAPARIN SODIUM 40 MG/0.4ML ~~LOC~~ SOLN: 40.0000 mg | SUBCUTANEOUS | Status: DC

## 2017-04-02 MED ORDER — INSULIN ASPART 100 UNIT/ML ~~LOC~~ SOLN: 0.0000 [IU] | SUBCUTANEOUS | Status: DC

## 2017-04-02 MED ORDER — ASPIRIN EC 81 MG PO TBEC
81.0000 mg | DELAYED_RELEASE_TABLET | Freq: Every day | ORAL | Status: DC
  Administered 2017-04-04 – 2017-04-05 (×2): 81 mg via ORAL
  Filled 2017-04-02 (×3): qty 1

## 2017-04-02 MED ORDER — CYANOCOBALAMIN 1000 MCG/ML IJ SOLN
1000.0000 ug | Freq: Every day | INTRAMUSCULAR | Status: DC
  Administered 2017-04-02 – 2017-04-05 (×4): 1000 ug via INTRAMUSCULAR
  Filled 2017-04-02 (×4): qty 1

## 2017-04-02 MED ORDER — ACETAMINOPHEN 325 MG PO TABS: 650.0000 mg | ORAL_TABLET | ORAL | Status: DC | PRN

## 2017-04-02 MED ORDER — CLOPIDOGREL BISULFATE 75 MG PO TABS
75.0000 mg | ORAL_TABLET | Freq: Every day | ORAL | Status: DC
Start: 2017-04-03 — End: 2017-04-05
  Administered 2017-04-04 – 2017-04-05 (×2): 75 mg via ORAL
  Filled 2017-04-02 (×3): qty 1

## 2017-04-02 MED ORDER — HEPARIN SODIUM (PORCINE) 5000 UNIT/ML IJ SOLN
5000.0000 [IU] | Freq: Three times a day (TID) | INTRAMUSCULAR | Status: DC
  Administered 2017-04-02 – 2017-04-05 (×8): 5000 [IU] via SUBCUTANEOUS
  Filled 2017-04-02 (×8): qty 1

## 2017-04-02 MED ORDER — ATORVASTATIN CALCIUM 80 MG PO TABS
80.0000 mg | ORAL_TABLET | Freq: Every day | ORAL | Status: DC
  Administered 2017-04-04 – 2017-04-05 (×2): 80 mg via ORAL
  Filled 2017-04-02 (×3): qty 1

## 2017-04-02 MED ORDER — ASPIRIN 81 MG PO CHEW: 81.0000 mg | CHEWABLE_TABLET | Freq: Once | ORAL | Status: DC

## 2017-04-02 MED ORDER — STROKE: EARLY STAGES OF RECOVERY BOOK
Freq: Once | Status: AC
  Administered 2017-04-02: 23:00:00
  Filled 2017-04-02: qty 1

## 2017-04-02 MED ORDER — SODIUM CHLORIDE 0.9 % IV SOLN
INTRAVENOUS | Status: DC
  Administered 2017-04-02 – 2017-04-04 (×3): via INTRAVENOUS

## 2017-04-02 MED ORDER — SODIUM CHLORIDE 0.9 % IV BOLUS (SEPSIS)
1000.0000 mL | Freq: Once | INTRAVENOUS | Status: AC
  Administered 2017-04-02: 1000 mL via INTRAVENOUS

## 2017-04-02 MED ORDER — ALBUTEROL SULFATE (2.5 MG/3ML) 0.083% IN NEBU: 2.5000 mg | INHALATION_SOLUTION | Freq: Four times a day (QID) | RESPIRATORY_TRACT | Status: DC | PRN

## 2017-04-02 MED ORDER — ATORVASTATIN CALCIUM 80 MG PO TABS: 80.0000 mg | ORAL_TABLET | Freq: Every day | ORAL | Status: DC

## 2017-04-02 NOTE — Progress Notes (Signed)
Pt admitted from ED via MRI with stroke diagnosis accompanied by family, pt alert but speaks no english, settled in bed with call light within reach, tele monitor put and verified on pt, was however reassured and will continue to monitor, v/s stable. Ob[place]ogie-Asidi, Bernetha Anschutz Efe

## 2017-04-02 NOTE — ED Notes (Signed)
Delay in collecting 2nd set blood culture,  Pt is in MRI.

## 2017-04-02 NOTE — ED Triage Notes (Addendum)
Pt brought in by EMS due to having AMS. Per EMS, pt was found at 9 am and wasn't "acting right". Per family, pt has become nonverbal. Pt has hx of CVA and left sided deficit. LSN unknown. Pt alert, but nonverbal.

## 2017-04-02 NOTE — ED Notes (Signed)
Patient transported to MRI with transporter 

## 2017-04-02 NOTE — ED Notes (Signed)
Translator ipad used to communicate with patient and family; both updated on plan of care (MRI and inpatient stay on [facility]); RN asked family if they felt patient was still confused and they stated no; RN asked family if they felt patient would be able to lie still for MRI to be completed and they replied yes; MRI called to get patient at this time

## 2017-04-02 NOTE — H&P (Signed)
History and Physical    [name] ZOX:[id number] DOB: Sep 28, 1940 DOA: 04/02/2017  Referring MD/NP/PA: er PCP: Fleet Contras, MD Outpatient Specialists:  Patient coming from: home with daughter  Chief Complaint: not speaking nor eating  HPI: [name] is a 76 y.o. male with medical history significant of recent CVA, DM and HTN.  He was hospitalized from 7/2-7/5 with new CVA.  Work up was completed and patient was started on ASA/plavix.  Daughter states that patient just today woke up and would not eat and per her, was not acting his normal.   Per daughter, patient was eating up to this AM but not much.  They were feeding him pudding and he was coughing when he took in food.     Review of Systems: unable to review as patient not answering questions   Past Medical History:  Diagnosis Date  . Diabetes (HCC)   . Dizziness   . Hypertension     Past Surgical History:  Procedure Laterality Date  . LAPAROSCOPIC APPENDECTOMY  02/21/2012   Procedure: APPENDECTOMY LAPAROSCOPIC;  Surgeon: Cherylynn Ridges, MD;  Location: [facility] OR;  Service: General;  Laterality: N/A;     reports that he has never smoked. He has never used smokeless tobacco. He reports that he does not drink alcohol or use drugs.  No Known Allergies  Family History  Problem Relation Age of Onset  . Diabetes Neg Hx   . Stroke Neg Hx   . Cancer Neg Hx      Prior to Admission medications   Medication Sig Start Date End Date Taking? Authorizing Provider  albuterol (PROVENTIL HFA;VENTOLIN HFA) 108 (90 Base) MCG/ACT inhaler Inhale 2 puffs into the lungs every 6 (six) hours as needed for wheezing or shortness of breath.    [provider]  aspirin EC 81 MG tablet Take 81 mg by mouth daily.    [provider]  atorvastatin (LIPITOR) 80 MG tablet Take 1 tablet (80 mg total) by mouth daily at 6 PM. 03/28/17   Penny Pia, MD  clopidogrel (PLAVIX) 75 MG tablet Take 1 tablet (75 mg total) by mouth daily. 03/29/17    Penny Pia, MD  glimepiride (AMARYL) 4 MG tablet Take 4 mg by mouth daily with breakfast.    [provider]  loratadine (CLARITIN) 10 MG tablet Take 10 mg by mouth daily.  12/10/11   [provider]  losartan-hydrochlorothiazide (HYZAAR) 100-12.5 MG tablet Take 1 tablet by mouth daily.    [provider]  meclizine (ANTIVERT) 12.5 MG tablet Take 12.5 mg by mouth 3 (three) times daily as needed for dizziness.    [provider]  metFORMIN (GLUCOPHAGE) 500 MG tablet Take 500 mg by mouth 2 (two) times daily with a meal.    [provider]    Physical Exam: Vitals:   04/02/17 1408 04/02/17 1413 04/02/17 1415 04/02/17 1544  BP: 134/86     Pulse: 91     Resp: 18     Temp: 98.5 F (36.9 C)   98.1 F (36.7 C)  TempSrc: Oral     SpO2: 98% 94%    Weight:   65.3 kg (144 lb)   Height:   5\' 7"  (1.702 m)       Constitutional: irritable, difficult to communicate with Vitals:   04/02/17 1408 04/02/17 1413 04/02/17 1415 04/02/17 1544  BP: 134/86     Pulse: 91     Resp: 18     Temp: 98.5 F (  36.9 C)   98.1 F (36.7 C)  TempSrc: Oral     SpO2: 98% 94%    Weight:   65.3 kg (144 lb)   Height:   5\' 7"  (1.702 m)    Eyes: PERRL, lids and conjunctivae normal ENMT: Mucous membranes are dry.  Neck: normal, supple, no masses, no thyromegaly Respiratory: clear to auscultation bilaterally, no wheezing, no crackles. Normal respiratory effort. No accessory muscle use.  Cardiovascular: Regular rate and rhythm, no murmurs / rubs / gallops. No extremity edema. 2+ pedal pulses. No carotid bruits.  Abdomen: no tenderness, no masses palpated. No hepatosplenomegaly. Bowel sounds positive. Did not palpate an enlarged bladder  Musculoskeletal: not cooperative with exam but able to move all 4 extremities on own.  When stood up, did have tendency to lean to right Skin: no rashes, lesions, ulcers. No induration Neurologic: not following commands, verbal but per  translator was not understandable  Psychiatric: irritable    Labs on Admission: I have personally reviewed following labs and imaging studies  CBC:  Recent Labs Lab 03/26/17 1823 03/26/17 1846 04/02/17 1548 04/02/17 1716  WBC 9.4  --  9.8  --   NEUTROABS 5.2  --  6.9  --   HGB 16.8 17.3* 19.5* 18.0*  HCT 47.4 51.0 55.0* 53.0*  MCV 93.5  --  95.2  --   PLT 185  --  205  --    Basic Metabolic Panel:  Recent Labs Lab 03/26/17 1823 03/26/17 1846 03/27/17 0355 04/02/17 1548 04/02/17 1716  NA 136 140 138 139 140  K 3.8 3.7 3.7 4.9 4.5  CL 102 102 105 102 106  CO2 24  --  22 19*  --   GLUCOSE 106* 105* 103* 94 84  BUN <5* 6 5* 34* 37*  CREATININE 0.82 0.70 0.69 2.04* 1.60*  CALCIUM 9.8  --  9.5 10.3  --    GFR: Estimated Creatinine Clearance: 36.8 mL/min (A) (by C-G formula based on SCr of 1.6 mg/dL (H)). Liver Function Tests:  Recent Labs Lab 03/26/17 1823 03/27/17 0355 04/02/17 1548  AST 42* 38 97*  ALT 36 32 82*  ALKPHOS 58 51 69  BILITOT 1.6* 2.2* 2.9*  PROT 8.3* 7.8 9.4*  ALBUMIN 4.1 3.8 4.6   No results for input(s): LIPASE, AMYLASE in the last 168 hours. No results for input(s): AMMONIA in the last 168 hours. Coagulation Profile:  Recent Labs Lab 03/26/17 1823 04/02/17 1548  INR 1.06 1.03   Cardiac Enzymes: No results for input(s): CKTOTAL, CKMB, CKMBINDEX, TROPONINI in the last 168 hours. BNP (last 3 results) No results for input(s): PROBNP in the last 8760 hours. HbA1C: No results for input(s): HGBA1C in the last 72 hours. CBG:  Recent Labs Lab 03/27/17 2121 03/28/17 0635 03/28/17 1123 03/28/17 1657 04/02/17 1624  GLUCAP 73 96 115* 77 94   Lipid Profile: No results for input(s): CHOL, HDL, LDLCALC, TRIG, CHOLHDL, LDLDIRECT in the last 72 hours. Thyroid Function Tests: No results for input(s): TSH, T4TOTAL, FREET4, T3FREE, THYROIDAB in the last 72 hours. Anemia Panel: No results for input(s): VITAMINB12, FOLATE, FERRITIN, TIBC,  IRON, RETICCTPCT in the last 72 hours. Urine analysis:    Component Value Date/Time   COLORURINE AMBER (A) 02/20/2012 1944   APPEARANCEUR CLEAR 02/20/2012 1944   LABSPEC 1.010 02/20/2012 1944   PHURINE 6.0 02/20/2012 1944   GLUCOSEU NEGATIVE 02/20/2012 1944   HGBUR SMALL (A) 02/20/2012 1944   HGBUR negative 08/28/2010 0959   BILIRUBINUR SMALL (A)  02/20/2012 1944   KETONESUR 15 (A) 02/20/2012 1944   PROTEINUR NEGATIVE 02/20/2012 1944   UROBILINOGEN 1.0 02/20/2012 1944   NITRITE NEGATIVE 02/20/2012 1944   LEUKOCYTESUR TRACE (A) 02/20/2012 1944   Sepsis Labs: Invalid input(s): PROCALCITONIN, LACTICIDVEN No results found for this or any previous visit (from the past 240 hour(s)).   Radiological Exams on Admission: Ct Head Wo Contrast  Result Date: 04/02/2017 CLINICAL DATA:  76 y/o  M; altered mental status. EXAM: CT HEAD WITHOUT CONTRAST TECHNIQUE: Contiguous axial images were obtained from the base of the skull through the vertex without intravenous contrast. COMPARISON:  03/27/2017 MRI head.  03/26/2017 CT head. FINDINGS: Brain: No evidence of acute infarction, hemorrhage, hydrocephalus, extra-axial collection or mass lesion/mass effect. Moderate chronic microvascular ischemic changes of white matter parenchymal volume loss of the brain. Lucencies in the right MCA watershed distribution corresponding to infarcts on prior MRI noted. Vascular: Calcific atherosclerosis of the carotid siphons. Skull: Normal. Negative for fracture or focal lesion. Sinuses/Orbits: 9 mm left anterior ethmoid osteoma. Visualized paranasal sinuses are otherwise clear. Orbits are unremarkable. Other: None. IMPRESSION: 1. No acute intracranial abnormality identified. 2. Stable moderate chronic microvascular ischemic changes and moderate parenchymal volume loss of the brain. 3. Small lucencies corresponding to right MCA distribution foci of infarction on prior MRI. Electronically Signed   By: Mitzi Hansen M.D.    On: 04/02/2017 15:23   Dg Chest Portable 1 View  Result Date: 04/02/2017 CLINICAL DATA:  76 year old male with a history of altered mental status EXAM: PORTABLE CHEST 1 VIEW COMPARISON:  03/27/2017 FINDINGS: Cardiomediastinal silhouette unchanged in size and contour. No pneumothorax or pleural effusion. No confluent airspace disease. Coarsened interstitial markings similar to prior. No displaced fracture IMPRESSION: No radiographic evidence of acute cardiopulmonary disease Electronically Signed   By: Gilmer Mor D.O.   On: 04/02/2017 15:31     Assessment/Plan Active Problems:   CVA (cerebral vascular accident) (HCC)   AKI (acute kidney injury) (HCC)   DM (diabetes mellitus) (HCC)   AKI -IVF -bladder scan- cath if needed -if Cr not improved with IVF, get renal duplex -SLP eval for intake  Recent CVA -? Worsening -get MRI and EEG to r/o seizures -asked ER PA to discuss with neuro-- appears that MRA has been ordered- someone will see after  AMS -see above MRI/EEG -add ammonia  DM -hold PO meds -SSI   DVT prophylaxis: scd Code Status: full Family Communication: family- unable to get [place] interpreter Disposition Plan: pending    Heather Mckendree Juanetta Gosling DO [facility] Pager [phone]  If 7PM-7AM, please contact night-coverage [email or web address] Password [facility]  04/02/2017, 5:44 PM

## 2017-04-02 NOTE — ED Provider Notes (Signed)
[facility] DEPT Provider Note   CSN: [id number] Arrival date & time: 04/02/17  1401     History   Chief Complaint Chief Complaint  Patient presents with  . Altered Mental Status    HPI [name] is a 76 y.o. male with a recent history of stroke discharge on 03/28/17 with out TPA who presents today with his family. His family reports that he he has been doing well since discharge, when he went to bed last night was normal. They report that this morning they woke him up around 9 and he has not been speaking since. They report that he has a "different smell" in his mouth. History is obtained by family, limited due to language barrier, however family speaks both [place] and [place].  They deny any coughs, no fevers or chills at home.  While in room assessing patient he spoke Nepali to his family and requested a drink of water.  They report that he has been not acting normal, and that he is more tired and less interactive. They also report that this morning he says that he has felt dizzy.  Family reports that he did not take his medications this morning.   HPI  Past Medical History:  Diagnosis Date  . Diabetes ([place])   . Dizziness   . Hypertension     Patient Active Problem List   Diagnosis Date Noted  . 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 Diabetic complication ([place]) 38/46/6599  . Dizziness 03/26/2017  . Postop check 03/11/2012  . HYDROCELE, RIGHT 08/28/2010  . NOCTURIA 08/28/2010  . OTH&UNSPEC NONINFECTIOUS GASTROENTERITIS&COLITIS 06/29/2010  . ABDOMINAL PAIN-RLQ 06/29/2010  . ABNORMAL FINDINGS GI TRACT 06/29/2010  . HYPERTENSION, BENIGN ESSENTIAL 05/02/2010  . LEUKOCYTOSIS 04/20/2010  . COLITIS, HX OF 04/20/2010    Past Surgical History:  Procedure Laterality Date  . LAPAROSCOPIC APPENDECTOMY  02/21/2012   Procedure: APPENDECTOMY LAPAROSCOPIC;  Surgeon: Gwenyth Ober, MD;  Location: [facility];  Service: General;  Laterality: N/A;       Home  Medications    Prior to Admission medications   Medication Sig Start Date End Date Taking? Authorizing Provider  albuterol (PROVENTIL HFA;VENTOLIN HFA) 108 (90 Base) MCG/ACT inhaler Inhale 2 puffs into the lungs every 6 (six) hours as needed for wheezing or shortness of breath.    [provider]  aspirin EC 81 MG tablet Take 81 mg by mouth daily.    [provider]  atorvastatin (LIPITOR) 80 MG tablet Take 1 tablet (80 mg total) by mouth daily at 6 PM. 03/28/17   Velvet Bathe, MD  clopidogrel (PLAVIX) 75 MG tablet Take 1 tablet (75 mg total) by mouth daily. 03/29/17   Velvet Bathe, MD  glimepiride (AMARYL) 4 MG tablet Take 4 mg by mouth daily with breakfast.    [provider]  loratadine (CLARITIN) 10 MG tablet Take 10 mg by mouth daily.  12/10/11   [provider]  losartan-hydrochlorothiazide (HYZAAR) 100-12.5 MG tablet Take 1 tablet by mouth daily.    [provider]  meclizine (ANTIVERT) 12.5 MG tablet Take 12.5 mg by mouth 3 (three) times daily as needed for dizziness.    [provider]  metFORMIN (GLUCOPHAGE) 500 MG tablet Take 500 mg by mouth 2 (two) times daily with a meal.    [provider]    Family History Family History  Problem Relation Age of Onset  . Diabetes Neg Hx   . Stroke Neg Hx   . Cancer  Neg Hx     Social History Social History  Substance Use Topics  . Smoking status: Never Smoker  . Smokeless tobacco: Never Used  . Alcohol use No     Allergies   Patient has no known allergies.   Review of Systems Review of Systems  Unable to perform ROS: Mental status change     Physical Exam Updated Vital Signs BP 134/86   Pulse 91   Temp 98.1 F (36.7 C)   Resp 18   Ht _0  (1.702 m)   Wt 65.3 kg (144 lb)   SpO2 94%   BMI 22.55 kg/m   Physical Exam  Constitutional: Vital signs are normal. He appears well-developed and well-nourished. He appears lethargic. He appears ill.  HENT:  Head:  Normocephalic and atraumatic. Head is without raccoon's eyes and without Battle's sign.  Right Ear: Tympanic membrane and external ear normal. No hemotympanum.  Left Ear: Tympanic membrane and external ear normal. No hemotympanum.  Nose: Nose normal.  Mouth/Throat: Uvula is midline, oropharynx is clear and moist and mucous membranes are normal. No uvula swelling.  Eyes: Conjunctivae are normal.  Neck: Trachea normal. Neck supple. No neck rigidity.  Cardiovascular: Normal rate, regular rhythm, intact distal pulses and normal pulses.   No murmur heard. Pulmonary/Chest: Effort normal and breath sounds normal. No accessory muscle usage or stridor. No respiratory distress. He has no decreased breath sounds. He has no wheezes. He has no rhonchi.  Abdominal: Soft. Normal appearance and bowel sounds are normal. He exhibits no distension. There is no tenderness.  Musculoskeletal: He exhibits no edema.  Neurological: He appears lethargic.  Patient responds to simple commands, can give thumbs up with right hand and squeeze fingers with right hand. He can wiggle his right toes.  Patient appears to comprehend some commands and instructions from family in native language.  Unable to participate in cranial nerve exam. Unable to assess sensory function. Patient appears unable to move left leg, does move left hand and wrist intermittently.  Patient is able to state simple phrases in a language to family, however is unable to answer questions.  Skin: Skin is warm and dry. He is not diaphoretic.  Psychiatric: He has a normal mood and affect.  Nursing note and vitals reviewed.    ED Treatments / Results  Labs   Ref Range & Units 2d ago  WBC 4.0 - 10.5 K/uL 9.8   RBC 4.22 - 5.81 MIL/uL 5.78   Hemoglobin 13.0 - 17.0 g/dL 19.5    HCT 39.0 - 52.0 % 55.0    MCV 78.0 - 100.0 fL 95.2   MCH 26.0 - 34.0 pg 33.7   MCHC 30.0 - 36.0 g/dL 35.5   RDW 11.5 - 15.5 % 13.1   Platelets 150 - 400 K/uL 205   Neutrophils  Relative % % 71   Neutro Abs 1.7 - 7.7 K/uL 6.9   Lymphocytes Relative % 17   Lymphs Abs 0.7 - 4.0 K/uL 1.7   Monocytes Relative % 11   Monocytes Absolute 0.1 - 1.0 K/uL 1.1    Eosinophils Relative % 1   Eosinophils Absolute 0.0 - 0.7 K/uL 0.1   Basophils Relative % 1   Basophils Absolute 0.0 - 0.1 K/uL 0.1    Sodium 135 - 145 mmol/L 139   Potassium 3.5 - 5.1 mmol/L 4.9   Chloride 101 - 111 mmol/L 102   CO2 22 - 32 mmol/L 19    Glucose, Bld 65 - 99  mg/dL 94   BUN 6 - 20 mg/dL 34    Creatinine, Ser 0.61 - 1.24 mg/dL 2.04    Calcium 8.9 - 10.3 mg/dL 10.3   Total Protein 6.5 - 8.1 g/dL 9.4    Albumin 3.5 - 5.0 g/dL 4.6   AST 15 - 41 U/L 97    ALT 17 - 63 U/L 82    Alkaline Phosphatase 38 - 126 U/L 69   Total Bilirubin 0.3 - 1.2 mg/dL 2.9    GFR calc non Af Amer >60 mL/min 30    GFR calc Af Amer >60 mL/min 35    Comment: (NOTE)  The eGFR has been calculated using the CKD EPI equation.  This calculation has not been validated in all clinical situations.  eGFR's persistently <60 mL/min signify possible Chronic Kidney  Disease.   Anion gap 5 - 15 18     Ethanol <5 PT 13.5, INR 1.03 APTT 33 POC CBG 94  I-stat troponin 0.000 I-stat Lactic acid 1.77 I-stat chem-8 Sodium 135 - 145 mmol/L 140   Potassium 3.5 - 5.1 mmol/L 4.5   Chloride 101 - 111 mmol/L 106   BUN 6 - 20 mg/dL 37    Creatinine, Ser 0.61 - 1.24 mg/dL 1.60    Glucose, Bld 65 - 99 mg/dL 84   Calcium, Ion 1.15 - 1.40 mmol/L 1.15   TCO2 0 - 100 mmol/L 19   Hemoglobin 13.0 - 17.0 g/dL 18.0    HCT 39.0 - 52.0 % 53.0     UA: ordered, not yet resulted    EKG  EKG Interpretation None       Radiology Ct Head Wo Contrast  Result Date: 04/02/2017 CLINICAL DATA:  76 y/o  M; altered mental status. EXAM: CT HEAD WITHOUT CONTRAST TECHNIQUE: Contiguous axial images were obtained from the base of the skull through the vertex without intravenous contrast. COMPARISON:  03/27/2017 MRI head.  03/26/2017 CT head.  FINDINGS: Brain: No evidence of acute infarction, hemorrhage, hydrocephalus, extra-axial collection or mass lesion/mass effect. Moderate chronic microvascular ischemic changes of white matter parenchymal volume loss of the brain. Lucencies in the right MCA watershed distribution corresponding to infarcts on prior MRI noted. Vascular: Calcific atherosclerosis of the carotid siphons. Skull: Normal. Negative for fracture or focal lesion. Sinuses/Orbits: 9 mm left anterior ethmoid osteoma. Visualized paranasal sinuses are otherwise clear. Orbits are unremarkable. Other: None. IMPRESSION: 1. No acute intracranial abnormality identified. 2. Stable moderate chronic microvascular ischemic changes and moderate parenchymal volume loss of the brain. 3. Small lucencies corresponding to right MCA distribution foci of infarction on prior MRI. Electronically Signed   By: Kristine Garbe M.D.   On: 04/02/2017 15:23   Dg Chest Portable 1 View  Result Date: 04/02/2017 CLINICAL DATA:  76 year old male with a history of altered mental status EXAM: PORTABLE CHEST 1 VIEW COMPARISON:  03/27/2017 FINDINGS: Cardiomediastinal silhouette unchanged in size and contour. No pneumothorax or pleural effusion. No confluent airspace disease. Coarsened interstitial markings similar to prior. No displaced fracture IMPRESSION: No radiographic evidence of acute cardiopulmonary disease Electronically Signed   By: Corrie Mckusick D.O.   On: 04/02/2017 15:31    Procedures Procedures (including critical care time)  Medications Ordered in ED Medications  sodium chloride 0.9 % bolus 1,000 mL (1,000 mLs Intravenous New Bag/Given 04/02/17 1615)     Initial Impression / Assessment and Plan / ED Course  I have reviewed the triage vital signs and the nursing notes.  Pertinent labs & imaging results  that were available during my care of the patient were reviewed by me and considered in my medical decision making (see chart for  details).  Clinical Course as of Apr 03 1839  Tue Apr 02, 2017  1636 Spoke with RN regarding blood work needing to be obtained, he states he will go draw it now.   [EH]  1742 Spoke with neurology, will order MR MRA Head and Neck with out Contrast.    [EH]    Clinical Course User Index [EH] Lorin Glass, PA-C   Myles Rosenthal presents with confusion, decreased interaction with family.  He was recently discharged from hospital after embolic stroke.  Family reports he was normal when he went to bed last night but since waking up around 9am has been altered.  Based on history patient last seen normal last night.   Based on his symptoms concern for repeat or progression of stroke, or encephalopathy.  Labs obtained.  UA still pending at time of admission.  Imaging and labs with out cause for acute decline.  CT head obtained with out changes from scans when was admitted for stroke.  Patient appears mildly dehydrated, will administer IV fluids.  Spoke with neurology who requested MR MRA head and neck which have been ordered.  Based on symptoms and history hospitalist consulted for admission.    Dr. Wilson Singer was involved in patient care and decision making.   The patient appears reasonably stabilized for admission considering the current resources, flow, and capabilities available in the ED at this time, and I doubt any other [facility] requiring further screening and/or treatment in the ED prior to admission.   Final Clinical Impressions(s) / ED Diagnoses   Final diagnoses:  Confusion    New Prescriptions New Prescriptions   No medications on file     Ollen Gross 04/04/17 1309    Virgel Manifold, MD 04/10/17 [phone]

## 2017-04-02 NOTE — ED Notes (Signed)
Pt assisted to stand at bedside to use urinal; instructed women providers to leave, male family assisted in using urinal; pt finished and was returned to bed by family and staff and placed back on monitor

## 2017-04-02 NOTE — Consult Note (Signed)
. Neurology Consultation  Reason for Consult: Altered mental status Referring Physician Dr. Jeraldine Loots  CC: Patient is speaking or eating  History is obtained from: Family  HPI: [name] is a 76 y.o. male with a past medical history diabetes, hypertension and hyperlipidemia who had a recent right MCA watershed stroke on July 4 for which she was admitted to [facility] and was discharged recently, was being brought back by family because the patient woke up today around 9 AM and wasn't acting normal. He refused to eat and was not speaking much as well. They tried to give him some pudding but he started coughing with and was having difficulty swallowing.   LKW: Last night tpa given?: no, recent stroke less than a week ago Premorbid modified Rankin scale (mRS): 3-Moderate disability-requires help but walks WITHOUT assistance   ICH Score: Not applicable   ROS:  Unable to obtain due to altered mental status.   Past Medical History:  Diagnosis Date  . Diabetes ([facility])   . Dizziness   . Hypertension   Bilateral carotid stenosis Recent right MCA stroke  Family History  Problem Relation Age of Onset  . Diabetes Neg Hx   . Stroke Neg Hx   . Cancer Neg Hx      Social History:   reports that he has never smoked. He has never used smokeless tobacco. He reports that he does not drink alcohol or use drugs.  MEDICATIONS  Current Facility-Administered Medications:  .  heparin injection 5,000 Units, 5,000 Units, Subcutaneous, Q8H, Milon Dikes, MD .  insulin aspart (novoLOG) injection 0-9 Units, 0-9 Units, Subcutaneous, Q4H, Vann, Jessica U, DO  Current Outpatient Prescriptions:  .  albuterol (PROVENTIL HFA;VENTOLIN HFA) 108 (90 Base) MCG/ACT inhaler, Inhale 2 puffs into the lungs every 6 (six) hours as needed for wheezing or shortness of breath., Disp: , Rfl:  .  aspirin EC 81 MG tablet, Take 81 mg by mouth daily., Disp: , Rfl:  .  atorvastatin (LIPITOR) 80 MG  tablet, Take 1 tablet (80 mg total) by mouth daily at 6 PM., Disp: 30 tablet, Rfl: 0 .  clopidogrel (PLAVIX) 75 MG tablet, Take 1 tablet (75 mg total) by mouth daily., Disp: 30 tablet, Rfl: 0 .  fluticasone (FLONASE) 50 MCG/ACT nasal spray, Place 2 sprays into both nostrils daily as needed for allergies., Disp: , Rfl: 3 .  glimepiride (AMARYL) 4 MG tablet, Take 4 mg by mouth daily with breakfast., Disp: , Rfl:  .  lisinopril (PRINIVIL,ZESTRIL) 40 MG tablet, Take 40 mg by mouth daily., Disp: , Rfl:  .  loratadine (CLARITIN) 10 MG tablet, Take 10 mg by mouth daily. , Disp: , Rfl:  .  losartan-hydrochlorothiazide (HYZAAR) 100-12.5 MG tablet, Take 1 tablet by mouth daily., Disp: , Rfl:  .  meclizine (ANTIVERT) 12.5 MG tablet, Take 12.5 mg by mouth 3 (three) times daily as needed for dizziness., Disp: , Rfl:  .  metFORMIN (GLUCOPHAGE) 500 MG tablet, Take 500 mg by mouth 2 (two) times daily with a meal., Disp: , Rfl:     Exam: Current vital signs: BP 134/86   Pulse 91   Temp 98.1 F (36.7 C)   Resp 18   Ht 5\' 7"  (1.702 m)   Wt 65.3 kg (144 lb)   SpO2 94%   BMI 22.55 kg/m  Vital signs in last 24 hours: Temp:  [98.1 F (36.7 C)-98.5 F (36.9 C)] 98.1 F (36.7 C) (07/10 1544) Pulse Rate:  [91]  91 (07/10 1408) Resp:  [18] 18 (07/10 1408) BP: (134)/(86) 134/86 (07/10 1408) SpO2:  [94 %-98 %] 94 % (07/10 1413) Weight:  [65.3 kg (144 lb)] 65.3 kg (144 lb) (07/10 1415)  General: Awake alert in no acute distress HEENT: Normocephalic/atraumatic, dry mucous membranes, no thyromegaly no lymphadenopathy Lungs clear to auscultation bilaterally with no wheezing Cardiovascular S1-S2, RRR, no murmur or gallop Abdomen: Soft nondistended nontender normoactive bowel sounds Extremities: Warm well perfused with intact peripheral pulses and no edema Neurological exam Mental status: Patient is to respond to all questions were addressed in English or in his native language [place]. He is oriented to self  but not oriented to time place at this time. He was able to tell his age but not the month. Language: Speech is low pitched forward did not seem dysarthric. He was able to name a pen but very hard to engage in conversation and examination. Cranial nerve exam: Pupils equal round reactive to light, 3 mm/brisk, external movements intact, VFF, no facial asymmetry, intact facial sensation, intact hearing, tongue uvula soft palate are midline, shoulder shrug intact, tongue midline. Motor exam: Normal 5/5 strength in the right upper and lower extremities with no drift. Left upper extremity has motor: Pronator drift. Left lower extremity has vertical drift almost hits bed in 5 sec. Normal tone and range of motion Sensory: Intact to light touch all over Coordination: Did not perform heel-knee-shin, mild ataxia not disproportionate to weakness of the left upper extremity. Gait was not tested  NIHSS - TOTAL - 4 NIH Stroke Scale (NIHSS) 1a. Level of Consciousness 0 1b. LOC Questions - 1 1c. LOC Commands -0 2. Best Gaze; 0 3. Visual Fields; 0  4. Facial Palsy; 0 5. Motor - R arm; 0 6. Motor - R leg; 0 7. Motor - L arm- 1 8. Motor - L leg-1 9. Limb Ataxia; 0  10. Sensory; 0 11. Best Language; 1  12. Dysarthria; 0 13. Extinction and Inattention (formerly Neglect); 0  Labs CBC    Component Value Date/Time   WBC 9.8 04/02/2017 1548   RBC 5.78 04/02/2017 1548   HGB 18.0 (H) 04/02/2017 1716   HCT 53.0 (H) 04/02/2017 1716   PLT 205 04/02/2017 1548   MCV 95.2 04/02/2017 1548   MCH 33.7 04/02/2017 1548   MCHC 35.5 04/02/2017 1548   RDW 13.1 04/02/2017 1548   LYMPHSABS 1.7 04/02/2017 1548   MONOABS 1.1 (H) 04/02/2017 1548   EOSABS 0.1 04/02/2017 1548   BASOSABS 0.1 04/02/2017 1548    CMP     Component Value Date/Time   NA 140 04/02/2017 1716   K 4.5 04/02/2017 1716   CL 106 04/02/2017 1716   CO2 19 (L) 04/02/2017 1548   GLUCOSE 84 04/02/2017 1716   BUN 37 (H) 04/02/2017 1716    CREATININE 1.60 (H) 04/02/2017 1716   CALCIUM 10.3 04/02/2017 1548   PROT 9.4 (H) 04/02/2017 1548   ALBUMIN 4.6 04/02/2017 1548   AST 97 (H) 04/02/2017 1548   ALT 82 (H) 04/02/2017 1548   ALKPHOS 69 04/02/2017 1548   BILITOT 2.9 (H) 04/02/2017 1548   GFRNONAA 30 (L) 04/02/2017 1548   GFRAA 35 (L) 04/02/2017 1548     Lipid Panel     Component Value Date/Time   CHOL 187 03/27/2017 0355   TRIG 97 03/27/2017 0355   HDL 42 03/27/2017 0355   CHOLHDL 4.5 03/27/2017 0355   VLDL 19 03/27/2017 0355   LDLCALC 126 (H) 03/27/2017 0355  I have reviewed the images obtained: Imaging CT-scan of the brain IMPRESSION: 1. No acute intracranial abnormality identified. 2. Stable moderate chronic microvascular ischemic changes and moderate parenchymal volume loss of the brain. 3. Small lucencies corresponding to right MCA distribution foci of infarction on prior MRI.  MRI examination of the brain - p Recent MRI of the brain from July 4 showed watershed strokes in the right [facility] territory. 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 There is no history of preceding illness according to the daughter was at bedside. His examination reveals normally left hemiparesis but also encephalopathy. Given his recent stroke, which was very watershed distribution in the right MCA territory and his vessel imaging showing bilateral intracranial or extracranial carotid disease, it would be prudent to rule out new strokes. Another differential to consider would be toxic/metabolic encephalopathy as he has some metabolic derangements including a normal TSH and B12  levels.  Impression: Evaluate for stroke in the setting of a recent stroke Toxic metabolic encephalopathy - due to low B12 and hypothyroidism. Bilateral carotid stenosis Vitamin B12 deficiency Hypothyroidism v. Sick euthyroid Acute kidney injury  Recommendations: -Because of a history of a recent stroke and significant stenoses of his bilateral carotids, I would recommend obtaining an MRI of the brain to evaluate for any new strokes and MRA of the head and neck to evaluate for worsening vascular obstruction. -On review of his chart, he has low B12 level (barely normal level which can cause encephalopathy - goal B12 level >400 at least) - replenish B 12 per primary team. -?Hypotyroid v. Sick euthyroid - management per primary team. -Speech therapy for swallow evaluation -Continue with dual antiplatelets-aspirin and Plavix because of severe intracranial stenosis. -Continue with atorvastatin 80 mg -For now, maintain permissive hypertension-treating when necessary if blood pressure is higher than 220 mmHg. -IV hydration and management of acute kidney injury per primary team. -EEG also ordered by primary team which is reasonable given recent stroke and encephalopathic exam -We will follow with you. Please call with questions.  Diet: Nothing by mouth for now, recommend speech evaluation for dysphagia DVT prophylaxis: Okay to use subcutaneous heparin  -- Milon DikesAshish Reneshia Zuccaro, MD [facility] [phone]  If 7pm to 7am, please call on call as listed on AMION.

## 2017-04-02 NOTE — ED Notes (Signed)
Delay in lab draw,  Pt still in MRI.

## 2017-04-02 NOTE — ED Notes (Signed)
MRI called requesting something d/t patient movement in MRI

## 2017-04-03 ENCOUNTER — Inpatient Hospital Stay (HOSPITAL_COMMUNITY): Payer: Medicaid Other

## 2017-04-03 DIAGNOSIS — I639 Cerebral infarction, unspecified: Secondary | ICD-10-CM

## 2017-04-03 DIAGNOSIS — R400 Somnolence: Secondary | ICD-10-CM

## 2017-04-03 DIAGNOSIS — E0800 Diabetes mellitus due to underlying condition with hyperosmolarity without nonketotic hyperglycemic-hyperosmolar coma (NKHHC): Secondary | ICD-10-CM

## 2017-04-03 DIAGNOSIS — N179 Acute kidney failure, unspecified: Secondary | ICD-10-CM

## 2017-04-03 DIAGNOSIS — R4182 Altered mental status, unspecified: Secondary | ICD-10-CM

## 2017-04-03 LAB — GLUCOSE, CAPILLARY
GLUCOSE-CAPILLARY: 83 mg/dL (ref 65–99)
GLUCOSE-CAPILLARY: 81 mg/dL (ref 65–99)
GLUCOSE-CAPILLARY: 103 mg/dL — AB (ref 65–99)
Glucose-Capillary: 81 mg/dL (ref 65–99)
Glucose-Capillary: 99 mg/dL (ref 65–99)
Glucose-Capillary: 72 mg/dL (ref 65–99)

## 2017-04-03 LAB — AMMONIA: Ammonia: 23 umol/L (ref 9–35)

## 2017-04-03 MED ORDER — DEXTROSE 5 % IV SOLN
1.0000 g | Freq: Once | INTRAVENOUS | Status: AC
  Administered 2017-04-03: 1 g via INTRAVENOUS
  Filled 2017-04-03: qty 10

## 2017-04-03 NOTE — Evaluation (Signed)
Occupational Therapy Evaluation Patient Details Name: [name] MRN: [id number] DOB: 01/07/1941 Today's Date: 04/03/2017    History of Present Illness Pt is a 76 y/o male admitted secondary to difficulty with speech and eating. 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 Of note, pt was recently hospitalized from 7/2-7/5 with an acute R MCA CVA. PMH including but not limited to DM and HTN.   Clinical Impression   Pt familiar to therapist from previous admission, and overall demeanor is more quiet, more flat, and less participatory. PTA Pt required min A from family for ADL and was using a RW for mobility. Pt would be an excellent candidate for CIR, if he would be willing to participate more with therapy. Pt struggled to follow directions today - not sure if it is attributed to language barrier, cognitive changes, or refusal. Pt will require continued skilled OT in the acute setting to maximize safety and independence in ADL and functional transfers. (RN staff has been usingh STEDY) And will benefit from CIR level therapy to return to PLOF, if he is willing to participate (potentially more success with male staff??!)    Follow Up Recommendations  CIR;Supervision/Assistance - 24 hour    Equipment Recommendations  None recommended by OT    Recommendations for Other Services       Precautions / Restrictions Precautions Precautions: Fall Restrictions Weight Bearing Restrictions: No      Mobility Bed Mobility Overal bed mobility: Needs Assistance Bed Mobility: Supine to Sit     Supine to sit: Supervision     General bed mobility comments: Pt would come to the edge of the bed and sit, no assist needed, use of bed rails and HOB elevated  Transfers                 General transfer comment: Pt declined at this time.     Balance Overall balance assessment: Needs assistance Sitting-balance support: Feet supported;No upper extremity  supported Sitting balance-Leahy Scale: Good Sitting balance - Comments: sitting EOB                                   ADL either performed or assessed with clinical judgement   ADL Overall ADL's : Needs assistance/impaired Eating/Feeding: Minimal assistance;With caregiver independent assisting;Sitting Eating/Feeding Details (indicate cue type and reason): Pt is not eating or drinking right now - even food that the family is bringing from home that would normally entice him Grooming: Set up;Sitting;Wash/dry face Grooming Details (indicate cue type and reason): bed level Upper Body Bathing: Minimal assistance;Bed level   Lower Body Bathing: Minimal assistance;Bed level   Upper Body Dressing : Minimal assistance;Sitting   Lower Body Dressing: Minimal assistance;Sitting/lateral leans   Toilet Transfer:  (not attempted this session, STEDY in room) Toilet Transfer Details (indicate cue type and reason): Pt requests male staff assist with bathroom needs Toileting- Clothing Manipulation and Hygiene: Minimal assistance       Functional mobility during ADLs:  (Pt declined at this time) General ADL Comments: Culturally Pt prefers male staff to assist with bathing and bathroom needs. OT explained that sometimes we only have male staff available. Pt is very flat, only communicated through wisper and daughter states it is very garbly.     Vision Baseline Vision/History: Wears glasses Wears Glasses: At all times Additional Comments: Vision not tested this session as Pt is not following directions for visual assessment at  this time. Not sure if it is a language difference, cognitive, or refusal to participate.     Perception     Praxis      Pertinent Vitals/Pain Pain Assessment: Faces Faces Pain Scale: No hurt     Hand Dominance Left   Extremity/Trunk Assessment Upper Extremity Assessment Upper Extremity Assessment: Generalized weakness (L weaker than R, Pt with very  little participation)   Lower Extremity Assessment Lower Extremity Assessment: Defer to PT evaluation   Cervical / Trunk Assessment Cervical / Trunk Assessment: Normal   Communication Communication Communication: Prefers language other than English;Expressive difficulties;Other (comment) (Pt's daughter present and provided translation)   Cognition Arousal/Alertness: Awake/alert Behavior During Therapy: Flat affect Overall Cognitive Status: Difficult to assess                                     General Comments  Pt's daughter present for session. She states that he used to be on depression medicine (about 6-7 months ago), and she is very scared and worried about him. Pt was largely non-cooperative during session (not sure if it was defiance, language barrier, cognition).    Exercises     Shoulder Instructions      Home Living Family/patient expects to be discharged to:: Private residence Living Arrangements: Other relatives;Children Available Help at Discharge: Family;Available 24 hours/day Type of Home: House       Home Layout: Two level Alternate Level Stairs-Number of Steps: 12 Alternate Level Stairs-Rails: Right Bathroom Shower/Tub: Chief Strategy Officer: Standard     Home Equipment: Cane - single point;Bedside commode;Shower seat;Wheelchair - [employer] - 2 wheels   Additional Comments: Pt remains very quiet. Background from daughter and previous admission  Lives With: Daughter    Prior Functioning/Environment Level of Independence: Independent with assistive device(s)  Gait / Transfers Assistance Needed: pt ambulated with use of SPC ADL's / Homemaking Assistance Needed: Pt min A since previous CVA discharge Communication / Swallowing Assistance Needed: [name]- language          OT Problem List: Impaired balance (sitting and/or standing);Impaired vision/perception;Decreased safety awareness;Impaired UE functional use      OT  Treatment/Interventions: Self-care/ADL training;DME and/or AE instruction;Visual/perceptual remediation/compensation;Patient/family education;Balance training    OT Goals(Current goals can be found in the care plan section) Acute Rehab OT Goals Patient Stated Goal: to get Dad more independent OT Goal Formulation: With family Time For Goal Achievement: 04/17/17 Potential to Achieve Goals: Fair ADL Goals Pt Will Perform Grooming: with supervision;with caregiver independent in assisting;standing Pt Will Perform Upper Body Bathing: with set-up;with caregiver independent in assisting;sitting Pt Will Perform Lower Body Bathing: with set-up;with caregiver independent in assisting;sitting/lateral leans Pt Will Transfer to Toilet: with min guard assist;ambulating (with caregiver independent in assisting, with RW) Pt Will Perform Toileting - Clothing Manipulation and hygiene: with supervision;with caregiver independent in assisting;sit to/from stand Pt Will Perform Tub/Shower Transfer: Tub transfer;with min guard assist;with caregiver independent in assisting;ambulating;shower seat;rolling walker  OT Frequency: Min 3X/week   Barriers to D/C:            Co-evaluation              AM-PAC PT "6 Clicks" Daily Activity     Outcome Measure Help from another person eating meals?: A Little Help from another person taking care of personal grooming?: A Little Help from another person toileting, which includes using toliet, bedpan, or urinal?: A  Little Help from another person bathing (including washing, rinsing, drying)?: A Little Help from another person to put on and taking off regular upper body clothing?: A Little Help from another person to put on and taking off regular lower body clothing?: A Little 6 Click Score: 18   End of Session Nurse Communication: Mobility status (requests male staff)  Activity Tolerance: Patient tolerated treatment well Patient left: in bed;with call bell/phone  within reach;with family/visitor present  OT Visit Diagnosis: Unsteadiness on feet (R26.81);Other symptoms and signs involving the nervous system (W09.811)                Time: 9147-8295 OT Time Calculation (min): 21 min Charges:    G-Codes:     Sherryl Manges OTR/L [phone]  [name] Weslyn Holsonback 04/03/2017, 5:05 PM

## 2017-04-03 NOTE — Evaluation (Signed)
Physical Therapy Evaluation Patient Details Name: [name] MRN: [id number] DOB: 05/26/1941 Today's Date: 04/03/2017   History of Present Illness  Pt is a 76 y/o male admitted secondary to difficulty with speech and eating. 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 Of note, pt was recently hospitalized from 7/2-7/5 with an acute R MCA CVA. PMH including but not limited to DM and HTN.  Clinical Impression  Pt presented supine in bed, very flat affect, not responding much at all to therapist. Pt's family member present (?grandson) to assist with translation. However, pt not responding to him in his native language either. Pt very limited this session secondary to cognition and was only able to long sit in bed, no physical assistance needed. According to the family member, pt was ambulating with use of SPC and performing ADLs independently prior to admission. Pt would continue to benefit from skilled physical therapy services at this time while admitted and after d/c to address the below listed limitations in order to improve overall safety and independence with functional mobility.      Follow Up Recommendations CIR;Supervision/Assistance - 24 hour    Equipment Recommendations  None recommended by PT    Recommendations for Other Services Rehab consult     Precautions / Restrictions Precautions Precautions: Fall Restrictions Weight Bearing Restrictions: No      Mobility  Bed Mobility Overal bed mobility: Modified Independent             General bed mobility comments: pt achieving long sitting in bed with increased time; pt not following command to sit EOB even with verbal/tactile cues and demonstration  Transfers                    Ambulation/Gait                Stairs            Wheelchair Mobility    Modified Rankin (Stroke Patients Only) Modified Rankin (Stroke Patients Only) Pre-Morbid Rankin Score: Moderately  severe disability Modified Rankin: Moderately severe disability     Balance Overall balance assessment: Needs assistance Sitting-balance support: Feet supported;No upper extremity supported Sitting balance-Leahy Scale: Good                                       Pertinent Vitals/Pain Pain Assessment: Faces Faces Pain Scale: No hurt    Home Living Family/patient expects to be discharged to:: Private residence Living Arrangements: Other relatives;Children Available Help at Discharge: Family Type of Home: House           Additional Comments: unsure of home environment; pt not responding to questions from therapist even with family present (younger man, ?grandson present but not very helpful)    Prior Function Level of Independence: Independent with assistive device(s)   Gait / Transfers Assistance Needed: pt ambulated with use of SPC           Hand Dominance        Extremity/Trunk Assessment   Upper Extremity Assessment Upper Extremity Assessment: Defer to OT evaluation    Lower Extremity Assessment Lower Extremity Assessment: Generalized weakness (difficult to assess due to pt not responding to commands)    Cervical / Trunk Assessment Cervical / Trunk Assessment: Normal  Communication   Communication: Prefers language other than English;Expressive difficulties;Other (comment) (?grandson present to translate, pt not responding)  Cognition Arousal/Alertness: Awake/alert  Behavior During Therapy: Flat affect Overall Cognitive Status: Difficult to assess                                        General Comments      Exercises     Assessment/Plan    PT Assessment Patient needs continued PT services  PT Problem List Decreased strength;Decreased activity tolerance;Decreased balance;Decreased mobility;Decreased coordination;Decreased cognition;Decreased knowledge of use of DME;Decreased safety awareness       PT Treatment  Interventions DME instruction;Gait training;Stair training;Functional mobility training;Therapeutic activities;Therapeutic exercise;Balance training;Neuromuscular re-education;Cognitive remediation;Patient/family education    PT Goals (Current goals can be found in the Care Plan section)  Acute Rehab PT Goals Patient Stated Goal: none stated PT Goal Formulation: Patient unable to participate in goal setting Time For Goal Achievement: 04/17/17 Potential to Achieve Goals: Good    Frequency Min 4X/week   Barriers to discharge        Co-evaluation               AM-PAC PT "6 Clicks" Daily Activity  Outcome Measure Difficulty turning over in bed (including adjusting bedclothes, sheets and blankets)?: A Little Difficulty moving from lying on back to sitting on the side of the bed? : A Little Difficulty sitting down on and standing up from a chair with arms (e.g., wheelchair, bedside commode, etc,.)?: Total Help needed moving to and from a bed to chair (including a wheelchair)?: A Little Help needed walking in hospital room?: A Lot Help needed climbing 3-5 steps with a railing? : A Lot 6 Click Score: 14    End of Session   Activity Tolerance: Other (comment) (pt limited secondary to impaired cognition/not responding) Patient left: in bed;with call bell/phone within reach;with bed alarm set;with family/visitor present Nurse Communication: Mobility status PT Visit Diagnosis: Other symptoms and signs involving the nervous system (R29.898);Other abnormalities of gait and mobility (R26.89)    Time: 1610-9604 PT Time Calculation (min) (ACUTE ONLY): 13 min   Charges:   PT Evaluation $PT Eval Moderate Complexity: 1 Procedure     PT G Codes:        Deborah Chalk, PT, DPT [phone]   Alessandra Bevels Kataleya Zaugg 04/03/2017, 3:28 PM

## 2017-04-03 NOTE — Progress Notes (Signed)
[facility] PROGRESS NOTE  [name] ZOX:[id number] DOB: 22-Jan-1941 DOA: 04/02/2017   PCP: Fleet Contras, MD     Assessment/Plan: Active Problems:   CVA (cerebral vascular accident) (HCC)   AKI (acute kidney injury) (HCC)   DM (diabetes mellitus) (HCC)   Elevated transaminase level   Altered mental state   76 y.o. male with a past medical history diabetes, hypertension and hyperlipidemia who had a  right MCA watershed stroke on 7/4 for which she was admitted to [facility] and was discharged recently, was being brought back by family because the patient woke up today around 9 AM and wasn't acting normal. He refused to eat and was not speaking much as well.  Patient brought back due to recurrent symptoms/altered mental status. Neurology consulted  Assessment and plan Acute metabolic encephalopathy in the setting of recent CVA: Right MCA scattered infarcts due to intracranial large vessel stenosis including right M1 occlusion, left MCA and bilateral PCA high-grade stenosis.   Resultant mild left arm pronator drift and dysmetria  CT head: Hyperdense left M2 branch possibly related to acute thrombosis versus atherosclerotic changes.   MRIhead: Right MCA scattered acute infarct,white matter infarcts and deep watershed pattern  MRAhead: Right M1 occlusion, Advanced intracranial atherosclerosis with moderate bilateral ICA and proximal basilar stenosis. High-grade left MCA bifurcation and bilateral P2 segment stenoses.  CarotidDoppler: Unremarkable  2D Echo: EF 55-60%. No source of embolus  LDL126  HgbA1c5.6   Patient discharged on   aspirin 81 mg daily and clopidogrel 75 mg daily. Continue DAPT for 3 months and then Plavix alone due to intracranial stenosis.  Patient discharged with home health Patient returned due to difficulty swallowing-speech therapy evaluation 7/11-dysphagia 1, pured diet, thin liquids MRI brain on 7/11 showed  further propagation of the acute right frontoparietal MCA infarct, repeat PT evaluation pending Chest x-ray did not show any radiographic abnormality Repeat neurological evaluation and recommendations pending    Acute kidney injury Discharge creatinine 0.69, presented with a creatinine of 2.04, likely secondary to dehydration, slightly improved today, continue hydration and follow renal function closely   Intracranial stenosis  MRA head right M1 clot occlusion, left M1 high-grade stenosis, bilateral PCA high-grade stenosis  On DAPTfor intracranial stenosis     Elevated TSH - Free T4 and T3 within normal limits as such will recommend patient have TSH reassessed within 4-6 weeks.  Hypertension - Hold Cozaar/HCTZ secondary to AKI   Hyperlipidemia - LDL 126 with goal less than 70, continue atorvastatin 80 mg by mouth daily this admission.  DM - We'll continue prior to admission medication regimen repeat hemoglobin A1c on that regimen 5.6 Metformin/Amaryl on hold as his CBGs have been low    DVT prophylaxsis heparin  Code Status:  Full code    Family Communication: Discussed in detail with the patient, all imaging results, lab results explained to the patient   Disposition Plan:  1-2 days     Consultants:  Neurology  Procedures:  None  Antibiotics: Anti-infectives    None         HPI/Subjective:  Very slow to respond, somewhat confused  Objective: Vitals:   04/03/17 0000 04/03/17 0200 04/03/17 0400 04/03/17 0600  BP: 124/90 128/81 113/89 (!) 132/92  Pulse: 73 75 71 78  Resp: 20 20 18 20   Temp:    97.7 F (36.5 C)  TempSrc:    Oral  SpO2: 97% 98% 95% 96%  Weight:  Height:        Intake/Output Summary (Last 24 hours) at 04/03/17 0949 Last data filed at 04/03/17 0600  Gross per 24 hour  Intake              605 ml  Output              350 ml  Net              255 ml    Exam:  Examination:  General exam: Appears somewhat  confused Respiratory system: Clear to auscultation. Respiratory effort normal. Cardiovascular system: S1 & S2 heard, RRR. No JVD, murmurs, rubs, gallops or clicks. No pedal edema. Gastrointestinal system: Abdomen is nondistended, soft and nontender. No organomegaly or masses felt. Normal bowel sounds heard. Central nervous system: Not following commands Extremities: Symmetric 5 x 5 power. Skin: No rashes, lesions or ulcers Psychiatry: Judgement and insight impaired    Data Reviewed: I have personally reviewed following labs and imaging studies  Micro Results No results found for this or any previous visit (from the past 240 hour(s)).  Radiology Reports Dg Chest 2 View  Result Date: 03/27/2017 CLINICAL DATA:  Dizziness and left-sided weakness. EXAM: CHEST  2 VIEW COMPARISON:  02/21/2012 FINDINGS: Lungs are adequately inflated without focal consolidation or effusion. Patient slightly rotated to the left. Cardiomediastinal silhouette is within normal. Subtle calcified plaque over the aortic arch. Mild degenerate change of the spine. IMPRESSION: No acute cardiopulmonary disease. Minimal aortic atherosclerosis. Electronically Signed   By: Elberta Fortis M.D.   On: 03/27/2017 07:50   Ct Head Wo Contrast  Result Date: 04/02/2017 CLINICAL DATA:  76 y/o  M; altered mental status. EXAM: CT HEAD WITHOUT CONTRAST TECHNIQUE: Contiguous axial images were obtained from the base of the skull through the vertex without intravenous contrast. COMPARISON:  03/27/2017 MRI head.  03/26/2017 CT head. FINDINGS: Brain: No evidence of acute infarction, hemorrhage, hydrocephalus, extra-axial collection or mass lesion/mass effect. Moderate chronic microvascular ischemic changes of white matter parenchymal volume loss of the brain. Lucencies in the right MCA watershed distribution corresponding to infarcts on prior MRI noted. Vascular: Calcific atherosclerosis of the carotid siphons. Skull: Normal. Negative for fracture or  focal lesion. Sinuses/Orbits: 9 mm left anterior ethmoid osteoma. Visualized paranasal sinuses are otherwise clear. Orbits are unremarkable. Other: None. IMPRESSION: 1. No acute intracranial abnormality identified. 2. Stable moderate chronic microvascular ischemic changes and moderate parenchymal volume loss of the brain. 3. Small lucencies corresponding to right MCA distribution foci of infarction on prior MRI. Electronically Signed   By: Mitzi Hansen M.D.   On: 04/02/2017 15:23   Ct Head Wo Contrast  Result Date: 03/26/2017 CLINICAL DATA:  76 year old hypertensive male with dizziness since last night. Bilateral arm and leg numbness. Facial droop this morning. EXAM: CT HEAD WITHOUT CONTRAST TECHNIQUE: Contiguous axial images were obtained from the base of the skull through the vertex without intravenous contrast. COMPARISON:  None. FINDINGS: Brain: No intracranial hemorrhage. Slightly hyperdense left middle cerebral artery M2 branch possibly related to acute thrombosis versus atherosclerotic changes. No other CT evidence of large acute infarct. Prominent chronic microvascular changes. This limits evaluation for detection of small or moderate-size infarct. Global atrophy. No intracranial mass lesion noted on this unenhanced exam. Vascular: Vascular calcifications in addition to above findings. Skull: No acute abnormality. Sinuses/Orbits: No acute orbital abnormality. Minimal mucosal thickening right maxillary sinus and ethmoid sinus air cells. Other: Mastoid air cells and middle ear cavities are clear. IMPRESSION: No intracranial hemorrhage.  Slightly hyperdense left middle cerebral artery M2 branch possibly related to acute thrombosis versus atherosclerotic changes. No other CT evidence of large acute infarct. Prominent chronic microvascular changes. This limits evaluation for detection of small or moderate-size infarct. Global atrophy. Electronically Signed   By: Lacy Duverney M.D.   On: 03/26/2017  19:07   Mr Brain Wo Contrast  Result Date: 04/02/2017 CLINICAL DATA:  Altered mental status, now nonverbal. History of stroke and LEFT-sided deficits. History of M1 occlusion, hypertension and diabetes. EXAM: MRI HEAD WITHOUT CONTRAST TECHNIQUE: Single axial diffusion weighted sequence. Patient was unable to tolerate further imaging in attempted to extract cell from scanner. COMPARISON:  CT HEAD April 02, 2017 and MRI of the head March 27, 2017 FINDINGS: Brain: Patchy reduced diffusion RIGHT frontoparietal lobes, further propagation from prior examination with low ADC values. No midline shift, or mass effect. No hydrocephalus. No abnormal extra-axial fluid collections. Vascular: Not assessed Skull and upper cervical spine: Not assessed Sinuses/Orbits: Not assessed Other: Not applicable IMPRESSION: Limited single sequence MRI of the head: Further propagation of acute RIGHT frontoparietal/MCA territory infarct. Electronically Signed   By: Awilda Metro M.D.   On: 04/02/2017 21:48   Mr Brain Wo Contrast (neuro Protocol)  Result Date: 03/27/2017 CLINICAL DATA:  Left-sided weakness. EXAM: MRI HEAD WITHOUT CONTRAST MRA HEAD WITHOUT CONTRAST TECHNIQUE: Multiplanar, multiecho pulse sequences of the brain and surrounding structures were obtained without intravenous contrast. Angiographic images of the head were obtained using MRA technique without contrast. COMPARISON:  Head CT from yesterday FINDINGS: MRI HEAD FINDINGS Brain: Moderate degree of patchy acute infarct in the right cerebral white matter, roughly aligned along the ventricular margin, MCA deep watershed pattern. At least 1 tiny cortical infarct is present along the frontal operculum. No separate distribution infarct noted. No hemorrhagic conversion. There is a background of extensive chronic microvascular ischemic gliosis in the cerebral white matter. No hydrocephalus or masslike findings. On SWI, asymmetric prominence of right sided sulcal vessels is  likely from slow flow and deoxygenation of blood in the setting of proximal MCA occlusion and presumed extensive pial pial collaterals. Patient is at least 3 days post ictus. Vascular: Abnormal right M1 segment flow void, see below. Skull and upper cervical spine: Negative for marrow lesion. Sinuses/Orbits: No acute finding MRA HEAD FINDINGS Comparatively small right ICA with less intense enhancement, with moderate stenosis at the anterior genu cavernous segment. Right M1 cut off. There is moderate narrowing of the proximal right A1 segment. Left A3 segment moderate to advanced stenosis. The left supraclinoid ICA is moderately narrowed. There is early branching left MCA with high-grade stenosis at the bifurcation. Left dominant vertebral artery. Proximal basilar stenosis due to shelf-like plaque, moderate narrowing. There is high-grade narrowing of bilateral P2 segments, worse on the left. Negative for aneurysm. Case discussed with Dr. Amada Jupiter at time of interpretation. IMPRESSION: 1. Right M1 occlusion with comparatively mild downstream white matter infarcts, deep watershed pattern. 2. Advanced intracranial atherosclerosis with moderate bilateral ICA and proximal basilar stenosis. High-grade left MCA bifurcation and bilateral P2 segment stenoses. 3. Advanced chronic small vessel ischemia in the cerebral white matter. Electronically Signed   By: Marnee Spring M.D.   On: 03/27/2017 19:04   Dg Chest Portable 1 View  Result Date: 04/02/2017 CLINICAL DATA:  76 year old male with a history of altered mental status EXAM: PORTABLE CHEST 1 VIEW COMPARISON:  03/27/2017 FINDINGS: Cardiomediastinal silhouette unchanged in size and contour. No pneumothorax or pleural effusion. No confluent airspace disease. Coarsened interstitial markings  similar to prior. No displaced fracture IMPRESSION: No radiographic evidence of acute cardiopulmonary disease Electronically Signed   By: Gilmer MorJaime  Wagner D.O.   On: 04/02/2017 15:31    Mr Maxine GlennMra Head/brain WJWo Cm  Result Date: 03/27/2017 CLINICAL DATA:  Left-sided weakness. EXAM: MRI HEAD WITHOUT CONTRAST MRA HEAD WITHOUT CONTRAST TECHNIQUE: Multiplanar, multiecho pulse sequences of the brain and surrounding structures were obtained without intravenous contrast. Angiographic images of the head were obtained using MRA technique without contrast. COMPARISON:  Head CT from yesterday FINDINGS: MRI HEAD FINDINGS Brain: Moderate degree of patchy acute infarct in the right cerebral white matter, roughly aligned along the ventricular margin, MCA deep watershed pattern. At least 1 tiny cortical infarct is present along the frontal operculum. No separate distribution infarct noted. No hemorrhagic conversion. There is a background of extensive chronic microvascular ischemic gliosis in the cerebral white matter. No hydrocephalus or masslike findings. On SWI, asymmetric prominence of right sided sulcal vessels is likely from slow flow and deoxygenation of blood in the setting of proximal MCA occlusion and presumed extensive pial pial collaterals. Patient is at least 3 days post ictus. Vascular: Abnormal right M1 segment flow void, see below. Skull and upper cervical spine: Negative for marrow lesion. Sinuses/Orbits: No acute finding MRA HEAD FINDINGS Comparatively small right ICA with less intense enhancement, with moderate stenosis at the anterior genu cavernous segment. Right M1 cut off. There is moderate narrowing of the proximal right A1 segment. Left A3 segment moderate to advanced stenosis. The left supraclinoid ICA is moderately narrowed. There is early branching left MCA with high-grade stenosis at the bifurcation. Left dominant vertebral artery. Proximal basilar stenosis due to shelf-like plaque, moderate narrowing. There is high-grade narrowing of bilateral P2 segments, worse on the left. Negative for aneurysm. Case discussed with Dr. Amada JupiterKirkpatrick at time of interpretation. IMPRESSION: 1. Right M1  occlusion with comparatively mild downstream white matter infarcts, deep watershed pattern. 2. Advanced intracranial atherosclerosis with moderate bilateral ICA and proximal basilar stenosis. High-grade left MCA bifurcation and bilateral P2 segment stenoses. 3. Advanced chronic small vessel ischemia in the cerebral white matter. Electronically Signed   By: Marnee SpringJonathon  Watts M.D.   On: 03/27/2017 19:04     CBC  Recent Labs Lab 04/02/17 1548 04/02/17 1716  WBC 9.8  --   HGB 19.5* 18.0*  HCT 55.0* 53.0*  PLT 205  --   MCV 95.2  --   MCH 33.7  --   MCHC 35.5  --   RDW 13.1  --   LYMPHSABS 1.7  --   MONOABS 1.1*  --   EOSABS 0.1  --   BASOSABS 0.1  --     Chemistries   Recent Labs Lab 04/02/17 1548 04/02/17 1716  NA 139 140  K 4.9 4.5  CL 102 106  CO2 19*  --   GLUCOSE 94 84  BUN 34* 37*  CREATININE 2.04* 1.60*  CALCIUM 10.3  --   AST 97*  --   ALT 82*  --   ALKPHOS 69  --   BILITOT 2.9*  --    ------------------------------------------------------------------------------------------------------------------ estimated creatinine clearance is 36.7 mL/min (A) (by C-G formula based on SCr of 1.6 mg/dL (H)). ------------------------------------------------------------------------------------------------------------------ No results for input(s): HGBA1C in the last 72 hours. ------------------------------------------------------------------------------------------------------------------ No results for input(s): CHOL, HDL, LDLCALC, TRIG, CHOLHDL, LDLDIRECT in the last 72 hours. ------------------------------------------------------------------------------------------------------------------ No results for input(s): TSH, T4TOTAL, T3FREE, THYROIDAB in the last 72 hours.  Invalid input(s): FREET3 ------------------------------------------------------------------------------------------------------------------ No results for input(s): VITAMINB12,  FOLATE, FERRITIN, TIBC, IRON,  RETICCTPCT in the last 72 hours.  Coagulation profile  Recent Labs Lab 04/02/17 1548  INR 1.03    No results for input(s): DDIMER in the last 72 hours.  Cardiac Enzymes No results for input(s): CKMB, TROPONINI, MYOGLOBIN in the last 168 hours.  Invalid input(s): CK ------------------------------------------------------------------------------------------------------------------ Invalid input(s): POCBNP   CBG:  Recent Labs Lab 04/02/17 1953 04/02/17 2201 04/02/17 2324 04/03/17 0341 04/03/17 0822  GLUCAP 93 88 86 83 81       Studies: Ct Head Wo Contrast  Result Date: 04/02/2017 CLINICAL DATA:  76 y/o  M; altered mental status. EXAM: CT HEAD WITHOUT CONTRAST TECHNIQUE: Contiguous axial images were obtained from the base of the skull through the vertex without intravenous contrast. COMPARISON:  03/27/2017 MRI head.  03/26/2017 CT head. FINDINGS: Brain: No evidence of acute infarction, hemorrhage, hydrocephalus, extra-axial collection or mass lesion/mass effect. Moderate chronic microvascular ischemic changes of white matter parenchymal volume loss of the brain. Lucencies in the right MCA watershed distribution corresponding to infarcts on prior MRI noted. Vascular: Calcific atherosclerosis of the carotid siphons. Skull: Normal. Negative for fracture or focal lesion. Sinuses/Orbits: 9 mm left anterior ethmoid osteoma. Visualized paranasal sinuses are otherwise clear. Orbits are unremarkable. Other: None. IMPRESSION: 1. No acute intracranial abnormality identified. 2. Stable moderate chronic microvascular ischemic changes and moderate parenchymal volume loss of the brain. 3. Small lucencies corresponding to right MCA distribution foci of infarction on prior MRI. Electronically Signed   By: Mitzi Hansen M.D.   On: 04/02/2017 15:23   Mr Brain Wo Contrast  Result Date: 04/02/2017 CLINICAL DATA:  Altered mental status, now nonverbal. History of stroke and LEFT-sided  deficits. History of M1 occlusion, hypertension and diabetes. EXAM: MRI HEAD WITHOUT CONTRAST TECHNIQUE: Single axial diffusion weighted sequence. Patient was unable to tolerate further imaging in attempted to extract cell from scanner. COMPARISON:  CT HEAD April 02, 2017 and MRI of the head March 27, 2017 FINDINGS: Brain: Patchy reduced diffusion RIGHT frontoparietal lobes, further propagation from prior examination with low ADC values. No midline shift, or mass effect. No hydrocephalus. No abnormal extra-axial fluid collections. Vascular: Not assessed Skull and upper cervical spine: Not assessed Sinuses/Orbits: Not assessed Other: Not applicable IMPRESSION: Limited single sequence MRI of the head: Further propagation of acute RIGHT frontoparietal/MCA territory infarct. Electronically Signed   By: Awilda Metro M.D.   On: 04/02/2017 21:48   Dg Chest Portable 1 View  Result Date: 04/02/2017 CLINICAL DATA:  76 year old male with a history of altered mental status EXAM: PORTABLE CHEST 1 VIEW COMPARISON:  03/27/2017 FINDINGS: Cardiomediastinal silhouette unchanged in size and contour. No pneumothorax or pleural effusion. No confluent airspace disease. Coarsened interstitial markings similar to prior. No displaced fracture IMPRESSION: No radiographic evidence of acute cardiopulmonary disease Electronically Signed   By: Gilmer Mor D.O.   On: 04/02/2017 15:31      Lab Results  Component Value Date   HGBA1C 5.6 03/27/2017   Lab Results  Component Value Date   MICROALBUR 0.50 08/28/2010   LDLCALC 126 (H) 03/27/2017   CREATININE 1.60 (H) 04/02/2017       Scheduled Meds: . aspirin  81 mg Oral Once  . aspirin EC  81 mg Oral Daily  . atorvastatin  80 mg Oral Daily  . clopidogrel  75 mg Oral Daily  . cyanocobalamin  1,000 mcg Intramuscular Daily  . heparin  5,000 Units Subcutaneous Q8H  . insulin aspart  0-9 Units Subcutaneous Q4H  Continuous Infusions: . sodium chloride 100 mL/hr at 04/03/17  0612     LOS: 1 day    Time spent: >30 MINS    Richarda Overlie  [facility] Pager [phone]. If 7PM-7AM, please contact night-coverage at [email or web address], password [facility] 04/03/2017, 9:49 AM  LOS: 1 day

## 2017-04-03 NOTE — Progress Notes (Signed)
Rehab Admissions Coordinator Note:  Patient was screened by Clois DupesBoyette, Jessalyn Hinojosa Godwin for appropriateness for an Inpatient Acute Rehab Consult per PT recommendation. Once pt participates more with therapy, I would recommend an inpt rehab consult. Please place order. At this time, we are recommending Inpatient Rehab consult.  Clois DupesBoyette, Ralph Benavidez Godwin 04/03/2017, 5:03 PM  I can be reached at [phone].

## 2017-04-03 NOTE — Progress Notes (Signed)
STROKE TEAM PROGRESS NOTE   HISTORY OF PRESENT ILLNESS (per record) [name] is a 76 y.o. 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  He [facility] and was discharged recently.  The family brought the patient [facility] 04/02/2017 around 0900 after noticing that he wasn't acting normal. He refused to eat and was not speaking much as well. They tried to give him some pudding, but he started coughing and had difficulty swallowing.  MRI on 04/02/2017 with further propagation of acute RIGHT frontoparietal/MCA territory infarct compared with 03/27/2017.  EEG on 04/03/2017 with global slowing suggesting encephalopathy without seizures.  The patient has a UTI.  Urine culture pending.  LKW: evening of 04/01/2017 Premorbid modified Rankin scale (mRS): 3  Patient was not administered IV t-PA secondary to recent stroke. He was admitted to General Neurology for further evaluation and treatment.   SUBJECTIVE (INTERVAL HISTORY) His daughter is at the bedside.  The patient is awake and alert.He was seen while having EEG in the EEG lab   OBJECTIVE Temp:  [97.3 F (36.3 C)-98.4 F (36.9 C)] 98.4 F (36.9 C) (07/11 0800) Pulse Rate:  [71-84] 73 (07/11 1130) Cardiac Rhythm: Normal sinus rhythm (07/11 0700) Resp:  [16-28] 16 (07/11 1130) BP: (113-149)/(69-98) 125/69 (07/11 1130) SpO2:  [95 %-100 %] 100 % (07/11 1130) Weight:  [65.1 kg (143 lb 8.3 oz)] 65.1 kg (143 lb 8.3 oz) (07/10 2200)  CBC:   Recent Labs Lab 04/02/17 1548 04/02/17 1716  WBC 9.8  --   NEUTROABS 6.9  --   HGB 19.5* 18.0*  HCT 55.0* 53.0*  MCV 95.2  --   PLT 205  --     Basic Metabolic Panel:   Recent Labs Lab 04/02/17 1548 04/02/17 1716  NA 139 140  K 4.9 4.5  CL 102 106  CO2 19*  --   GLUCOSE 94 84  BUN 34* 37*  CREATININE 2.04* 1.60*   CALCIUM 10.3  --     Lipid Panel:     Component Value Date/Time   CHOL 187 03/27/2017 0355   TRIG 97 03/27/2017 0355   HDL 42 03/27/2017 0355   CHOLHDL 4.5 03/27/2017 0355   VLDL 19 03/27/2017 0355   LDLCALC 126 (H) 03/27/2017 0355   HgbA1c:  Lab Results  Component Value Date   HGBA1C 5.6 03/27/2017   Urine Drug Screen: No results found for: LABOPIA, COCAINSCRNUR, LABBENZ, AMPHETMU, THCU, LABBARB  Alcohol Level     Component Value Date/Time   ETH <5 04/02/2017 1548    IMAGING  Ct Head Wo Contrast 04/02/2017 IMPRESSION: 1. No acute intracranial abnormality identified. 2. Stable moderate chronic microvascular ischemic changes and moderate parenchymal volume loss of the brain. 3. Small lucencies corresponding to right MCA distribution foci of infarction on prior MRI.   Mr Brain Wo Contrast 04/02/2017 IMPRESSION: Limited single sequence MRI of the head: Further propagation of acute RIGHT frontoparietal/MCA territory infarct.  Dg Chest Portable 1 View 04/02/2017 IMPRESSION: No radiographic evidence of acute cardiopulmonary disease      PHYSICAL EXAM Frail elderly Nepalese origin male not in distress. . Afebrile. Head is nontraumatic. Neck is supple without bruit.    Cardiac exam no murmur or gallop. Lungs are clear to auscultation. Distal pulses are well felt. Neurological Exam ;  Awake  Alert  Patient speaks only Nepalese hence mental  status exam is limited. Not following commands or speaking but can track.Marland Kitchen eye movements full without nystagmus.fundi were not visualized. Diminished blink to threat on the left compared to the right. Left lower facial weakness. Tongue midline. Palatal movements are normal.  . Tongue midline. Normal strength, tone, reflexes and coordination on the right side. Left hemiplegia with left upper and lower extremity 2/5 strength. He will not cooperate for detailed motor system exam. Normal sensation. Gait deferred.  ASSESSMENT/PLAN [name]  is a 76 y.o. 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 He did not receive IV t-PA due to recent stroke.   Stroke: Further propagation of acute RIGHT frontoparietal/MCA territory infarct as well as encephalopathy that is likely secondary to UTI  Resultant  left hemiplegia and altered mental status   CT head: Small lucencies corresponding to right MCA distribution foci of infarction on prior MRI  MRI head: Limited single sequence MRI of the head: Further propagation of acute RIGHT frontoparietal/MCA territory infarct.  MRA head: not ordered  Carotid Doppler (03/27/2017): B ICA 1-39% stenosis, VAs antegrade  2D Echo (03/27/2017): EF 55-60%. No source of embolus   LDL (03/27/2017) 126  HgbA1c ()03/27/2017) 5.6  SCDs for VTE prophylaxis DIET - DYS 1 Room service appropriate? Yes; Fluid consistency: Thin  aspirin 81 mg daily and clopidogrel 75 mg daily prior to admission, now on aspirin 81 mg daily and clopidogrel 75 mg daily  Patient counseled to be compliant with his antithrombotic medications  Ongoing aggressive stroke risk factor management  Therapy recommendations: CIR;Supervision/Assistance - 24 hour  Disposition:   pending  Hypertension  Stable, marginal  Permissive hypertension (OK if < 220/120) but gradually normalize in 5-7 days  Long-term BP goal normotensive  Hyperlipidemia  Home meds:  Atorvastatin 80mg  PO daily, resumed in hospital  LDL (03/27/2017) 126, goal < 70  Continue statin at discharge  Diabetes  HgbA1c (03/27/2017) 5.6, goal < 7.0  Controlled  Other Stroke Risk Factors  Advanced age  Overweight, Body mass index is 22.48 kg/m., recommend weight loss, diet and exercise as appropriate   Hx stroke/TIA  hx stroke    Other Active Problems  UTI: urine culture sent, treated with ceftriaxone 1g IV once  Hospital day # 1  I  have personally examined this patient, reviewed notes, independently viewed imaging studies, participated in medical decision making and plan of care.ROS completed by me personally and pertinent positives fully documented  I have made any additions or clarifications directly to the above note. Patient has presented with worsening of his recent stroke deficits from a week ago without a mental status in the setting of urinary tract infection and dehydration. Continue dual antiplatelet therapy, IV hydration and antibiotics. I do not believe any further stroke workup is necessary as patient had complete evaluation last week. Long discussion with the patient's daughter at the bedside and with Dr. Susie Cassette and answered questions. Greater than 50% time during this 35 minute visit was spent  on counseling and coordination of care about his stroke, neurological worsening, treatment discussion and answering questions.  Delia Heady, MD Medical Director [facility] Pager: [phone] 04/03/2017 4:42 PM   To contact Stroke Continuity provider, please refer to [email or web address]. After hours, contact General Neurology

## 2017-04-03 NOTE — Progress Notes (Signed)
Spoke with [name] from MRI who stated that pt received Ativan prior to but was unsuccessful. Md text paged.

## 2017-04-03 NOTE — Evaluation (Signed)
Speech Language Pathology Evaluation Patient Details Name: [name] MRN: [id number] DOB: 05/04/1941 Today's Date: 04/03/2017 Time: [id number] SLP Time Calculation (min) (ACUTE ONLY): 19 min  Problem List:  Patient Active Problem List   Diagnosis Date Noted  . CVA (cerebral vascular accident) (HCC) 04/02/2017  . AKI (acute kidney injury) (HCC) 04/02/2017  . DM (diabetes mellitus) (HCC) 04/02/2017  . Elevated transaminase level 04/02/2017  . 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 Diabetic complication (HCC) 03/26/2017  . Dizziness 03/26/2017  . Postop check 03/11/2012  . HYDROCELE, RIGHT 08/28/2010  . NOCTURIA 08/28/2010  . OTH&UNSPEC NONINFECTIOUS GASTROENTERITIS&COLITIS 06/29/2010  . ABDOMINAL PAIN-RLQ 06/29/2010  . ABNORMAL FINDINGS GI TRACT 06/29/2010  . HYPERTENSION, BENIGN ESSENTIAL 05/02/2010  . LEUKOCYTOSIS 04/20/2010  . COLITIS, HX OF 04/20/2010   Past Medical History:  Past Medical History:  Diagnosis Date  . Diabetes (HCC)   . Dizziness   . Hypertension    Past Surgical History:  Past Surgical History:  Procedure Laterality Date  . LAPAROSCOPIC APPENDECTOMY  02/21/2012   Procedure: APPENDECTOMY LAPAROSCOPIC;  Surgeon: Cherylynn RidgesJames O Wyatt, MD;  [facility];  Service: General;  Laterality: N/A;   HPI:  [name] a 76 y.o.malewith medical history significant of recent CVA, DM and HTN. He was hospitalized from 7/2-7/5 with new CVA. Work up was completed and patient was started on ASA/plavix. Daughter states that patient just today woke up and would not eat and per her, was not acting his normal. Per daughter, patient was eating up to this AM but not much. They were feeding him pudding and he was coughing when he took in food. MRI reveal further propagation of acute RIGHT frontoparietal/MCA territory infarct. Of note, pt with previous admission on 03/28/17 for Left MCA CVA. At that time, pt evaluated for ST services with evaluation revealing  mild dysarthria, cues for basic problem solving and left-sided awareness   Assessment / Plan / Recommendation Clinical Impression  Pt's daugther present and assisted with evaluation. Informally, pt has motor deficits at appear to impact his ability to imitate facial movements and coordinate opening his mouth to receive boluses of food. Additionally, he demonstrated decreased task initiation, decreased focused attention and inaiblity to follow 1 step directions with visual and physical cues. Per daughter's report, pt speech is slurred and sounds garbled. Skilled ST is recommended to further address these deficits.     SLP Assessment  SLP Recommendation/Assessment: Patient needs continued Speech Lanaguage Pathology Services SLP Visit Diagnosis: Cognitive communication deficit (R41.841)    Follow Up Recommendations  Home health SLP;24 hour supervision/assistance    Frequency and Duration min 2x/week  2 weeks      SLP Evaluation Cognition  Overall Cognitive Status: Difficult to assess Arousal/Alertness: Awake/alert Orientation Level: Other (comment) (unable to assess) Attention: Focused Focused Attention: Impaired Problem Solving: Impaired Problem Solving Impairment: Functional basic       Comprehension  Auditory Comprehension Commands: Impaired One Step Basic Commands:  (Pt unable to imitate any facial movements)    Expression Expression Primary Mode of Expression: Verbal Verbal Expression Overall Verbal Expression: Other (comment) (Daughter reports that she is not able to understand pt) Initiation: Impaired Pragmatics: Impairment Impairments: Abnormal affect;Eye contact;Interpretation of nonverbal communication   Oral / Motor  Oral Motor/Sensory Function Overall Oral Motor/Sensory Function: Generalized oral weakness (Pt unable to imitate any facial movements) Facial ROM:  (Difficult to assess given pt's inability to imitate facial m) Motor Speech Overall Motor Speech:  Impaired Respiration: Within functional limits Phonation:  Normal Resonance: Within functional limits Articulation: Impaired Level of Impairment: Word Intelligibility: Intelligibility reduced Word: 0-24% accurate Motor Planning: Impaired Level of Impairment: Word Motor Speech Errors: Unaware   GO                    Jolita Haefner 04/03/2017, 9:44 AM

## 2017-04-03 NOTE — Evaluation (Signed)
Clinical/Bedside Swallow Evaluation Patient Details  Name: [name] MRN: [id number] Date of Birth: 05/12/1941  Today's Date: 04/03/2017 Time: SLP Start Time (ACUTE ONLY): 0830 SLP Stop Time (ACUTE ONLY): 0841 SLP Time Calculation (min) (ACUTE ONLY): 11 min  Past Medical History:  Past Medical History:  Diagnosis Date  . Diabetes (HCC)   . Dizziness   . Hypertension    Past Surgical History:  Past Surgical History:  Procedure Laterality Date  . LAPAROSCOPIC APPENDECTOMY  02/21/2012   Procedure: APPENDECTOMY LAPAROSCOPIC;  Surgeon: Cherylynn RidgesJames O Wyatt, MD;  [facility];  Service: General;  Laterality: N/A;   HPI:  [name] a 76 y.o.malewith medical history significant of recent CVA, DM and HTN. He was hospitalized from 7/2-7/5 with new CVA. Work up was completed and patient was started on ASA/plavix. Daughter states that patient just today woke up and would not eat and per her, was not acting his normal. Per daughter, patient was eating up to this AM but not much. They were feeding him pudding and he was coughing when he took in food. MRI reveal further propagation of acute RIGHT frontoparietal/MCA territory infarct. Of note, pt with previous admission on 03/28/17 for Left MCA CVA. At that time, pt evaluated for ST services with evaluation revealing mild dysarthria, cues for basic problem solving and left-sided awareness   Assessment / Plan / Recommendation Clinical Impression  Pt appears at increased risk for aspiration d/t inconsistent inability to open mouth for fully receive puree boluses. When pt able to get puree off spoon he present with funcitonal oral and pharyngeal abilities. Thin liquids presented via cup. Pt consumed 4 oz without any overt s/s of aspiration. Although pt was free of overt s/s of aspiration at bedside, pt is at risk for nutrition compromise given inability to fully open mouth to receive food. Pt's daughter present and states that pt at home, he had been  consuming primarily liquid diet and soft items such as rice pudding. Recommend ST to follow during acute hospitalization for diet tolerance. Education provided to daughter and nursing on diet recommendation.  SLP Visit Diagnosis: Dysphagia, oropharyngeal phase (R13.12)    Aspiration Risk  Mild aspiration risk    Diet Recommendation Dysphagia 1 (Puree);Thin liquid   Liquid Administration via: Cup Medication Administration: Crushed with puree Supervision: Staff to assist with self feeding;Full supervision/cueing for compensatory strategies Compensations: Minimize environmental distractions;Slow rate;Small sips/bites Postural Changes: Seated upright at 90 degrees    Other  Recommendations Oral Care Recommendations: Oral care BID   Follow up Recommendations Home health SLP;24 hour supervision/assistance      Frequency and Duration min 2x/week  2 weeks       Prognosis Prognosis for Safe Diet Advancement: Fair Barriers to Reach Goals: Cognitive deficits;Language deficits      Swallow Study   General Date of Onset: 04/02/17 HPI: [name] a 76 y.o.malewith medical history significant of recent CVA, DM and HTN. He was hospitalized from 7/2-7/5 with new CVA. Work up was completed and patient was started on ASA/plavix. Daughter states that patient just today woke up and would not eat and per her, was not acting his normal. Per daughter, patient was eating up to this AM but not much. They were feeding him pudding and he was coughing when he took in food. MRI reveal further propagation of acute RIGHT frontoparietal/MCA territory infarct. Of note, pt with previous admission on 03/28/17 for Left MCA CVA. At that time, pt evaluated for ST services with evaluation revealing  mild dysarthria, cues for basic problem solving and left-sided awareness Type of Study: Bedside Swallow Evaluation Previous Swallow Assessment: none in chart Diet Prior to this Study: NPO Temperature Spikes Noted:  No Respiratory Status: Room air History of Recent Intubation: No Behavior/Cognition: Alert Oral Cavity Assessment: Within Functional Limits Oral Care Completed by SLP: No Oral Cavity - Dentition: Poor condition Vision: Functional for self-feeding Self-Feeding Abilities: Total assist Patient Positioning: Upright in bed Baseline Vocal Quality: Normal Volitional Cough: Strong Volitional Swallow: Unable to elicit    Oral/Motor/Sensory Function Overall Oral Motor/Sensory Function: Generalized oral weakness Facial ROM:  (Difficult to assess given pt's inability to imitate facial m)   Ice Chips Other Comments:  (Pt unable to open mouth to receive ice chips.)   Thin Liquid Thin Liquid: Within functional limits Presentation: Cup (Pt able to receive cup at lips and consume via cup.)    Nectar Thick Nectar Thick Liquid: Not tested   Honey Thick Honey Thick Liquid: Not tested   Puree Puree: Impaired Presentation: Spoon Oral Phase Impairments:  (Pt with difficulty opening mouth to receive puree)   Solid   GO   Solid: Not tested       Ozell Ferrera B. Dreama Saa, M.S., CCC-SLP Speech-Language Pathologist  Latanza Pfefferkorn 04/03/2017,9:27 AM

## 2017-04-03 NOTE — Progress Notes (Signed)
OT Cancellation Note  Patient Details Name: [name] MRN: [id number] DOB: 10/22/1940   Cancelled Treatment:    Reason Eval/Treat Not Completed: Patient at procedure or test/ unavailable. Pt off the floor at an EEG. OT will check back as able to perform evaluation.  Evern BioLaura J Lucita Montoya 04/03/2017, 10:23 AM  [name] [phone]

## 2017-04-03 NOTE — Progress Notes (Addendum)
Per attending Rn, pt hasn't voids the last 6 hrs, staffs attempting to bladder scan and ambulate pt to bathroom but pt refused. Daughter at bedside translating and pt request male nurse at this time for any care due to cultural difference. No male nurse present. per Dr. Susie CassetteAbrol order for foley insertion at this time for strict input and out take if pt unable to void.   Sim BoastHavy, RN

## 2017-04-03 NOTE — Progress Notes (Signed)
EEG completed, results pending. 

## 2017-04-03 NOTE — Procedures (Signed)
ELECTROENCEPHALOGRAM REPORT  Date of Study: 04/03/2017  Patient's Name: [name] MRN: [id number] Date of Birth: 07/03/1941  Referring Provider: Joseph ArtJessica U Vann, Do  Clinical History: 76 year old man with recent right MCA stroke presents for altered mental status.  Medications:  0.9 % sodium chloride infusion  L1 acetaminophen (TYLENOL) solution 650 mg  L1 acetaminophen (TYLENOL) suppository 650 mg  L1 acetaminophen (TYLENOL) tablet 650 mg   albuterol (PROVENTIL) (2.5 MG/3ML) 0.083% nebulizer solution 2.5 mg   aspirin chewable tablet 81 mg   aspirin EC tablet 81 mg   atorvastatin (LIPITOR) tablet 80 mg   clopidogrel (PLAVIX) tablet 75 mg   cyanocobalamin ((VITAMIN B-12)) injection 1,000 mcg   heparin injection 5,000 Units   insulin aspart (novoLOG) injection 0-9 Units   Technical Summary: A multichannel digital EEG recording measured by the international 10-20 system with electrodes applied with paste and impedances below 5000 ohms performed as portable with EKG monitoring in an awake and drowsy patient.  Hyperventilation and photic stimulation were not performed.  The digital EEG was referentially recorded, reformatted, and digitally filtered in a variety of bipolar and referential montages for optimal display.   Description: The patient is awake and drowsy during the recording.  During maximal wakefulness, there is a symmetric, medium voltage 9 Hz posterior dominant rhythm that attenuates with eye opening. This is admixed with  diffuse 4-5 Hz theta and 2-3 Hz delta slowing of the waking background.  There is also superimposed focal 2 Hz delta slowing over the right temporal region.  Stage 2 sleep is not seen.  There were no epileptiform discharges or electrographic seizures seen.    EKG lead was unremarkable.  Impression: This awake and drowsy EEG is abnormal due to: 1.  Right temporal slowing 2.  diffuse slowing of the waking background.  Clinical Correlation of the above  findings indicates: 1.  Structural or physiologic abnormality in the right temporal region. 2.   diffuse cerebral dysfunction that is non-specific in etiology and can be seen with hypoxic/ischemic injury, toxic/metabolic encephalopathies, neurodegenerative disorders, or medication effect.    The absence of epileptiform discharges does not rule out a clinical diagnosis of epilepsy.  Clinical correlation is advised.  Shon MilletAdam Jaffe, DO

## 2017-04-04 DIAGNOSIS — R410 Disorientation, unspecified: Secondary | ICD-10-CM

## 2017-04-04 DIAGNOSIS — I69319 Unspecified symptoms and signs involving cognitive functions following cerebral infarction: Secondary | ICD-10-CM

## 2017-04-04 DIAGNOSIS — I69398 Other sequelae of cerebral infarction: Secondary | ICD-10-CM

## 2017-04-04 DIAGNOSIS — G8194 Hemiplegia, unspecified affecting left nondominant side: Secondary | ICD-10-CM

## 2017-04-04 DIAGNOSIS — R740 Nonspecific elevation of levels of transaminase and lactic acid dehydrogenase [LDH]: Secondary | ICD-10-CM

## 2017-04-04 DIAGNOSIS — R4020 Unspecified coma: Secondary | ICD-10-CM

## 2017-04-04 DIAGNOSIS — I63011 Cerebral infarction due to thrombosis of right vertebral artery: Secondary | ICD-10-CM

## 2017-04-04 DIAGNOSIS — R269 Unspecified abnormalities of gait and mobility: Secondary | ICD-10-CM

## 2017-04-04 LAB — CBC
HCT: 42.2 % (ref 39.0–52.0)
Hemoglobin: 14.8 g/dL (ref 13.0–17.0)
MCH: 32.9 pg (ref 26.0–34.0)
MCHC: 35.1 g/dL (ref 30.0–36.0)
MCV: 93.8 fL (ref 78.0–100.0)
PLATELETS: 161 10*3/uL (ref 150–400)
RBC: 4.5 MIL/uL (ref 4.22–5.81)
RDW: 12.8 % (ref 11.5–15.5)
WBC: 6.4 10*3/uL (ref 4.0–10.5)

## 2017-04-04 LAB — GLUCOSE, CAPILLARY
GLUCOSE-CAPILLARY: 99 mg/dL (ref 65–99)
GLUCOSE-CAPILLARY: 97 mg/dL (ref 65–99)
GLUCOSE-CAPILLARY: 87 mg/dL (ref 65–99)
Glucose-Capillary: 80 mg/dL (ref 65–99)
Glucose-Capillary: 102 mg/dL — ABNORMAL HIGH (ref 65–99)
Glucose-Capillary: 84 mg/dL (ref 65–99)

## 2017-04-04 LAB — COMPREHENSIVE METABOLIC PANEL
ALK PHOS: 49 U/L (ref 38–126)
ALT: 67 U/L — AB (ref 17–63)
AST: 76 U/L — ABNORMAL HIGH (ref 15–41)
Albumin: 3.4 g/dL — ABNORMAL LOW (ref 3.5–5.0)
Anion gap: 12 (ref 5–15)
BUN: 22 mg/dL — ABNORMAL HIGH (ref 6–20)
CO2: 18 mmol/L — AB (ref 22–32)
CREATININE: 1.23 mg/dL (ref 0.61–1.24)
Calcium: 8.8 mg/dL — ABNORMAL LOW (ref 8.9–10.3)
Chloride: 111 mmol/L (ref 101–111)
GFR calc non Af Amer: 56 mL/min — ABNORMAL LOW (ref >60)
GLUCOSE: 79 mg/dL (ref 65–99)
Potassium: 3.6 mmol/L (ref 3.5–5.1)
SODIUM: 141 mmol/L (ref 135–145)
Total Bilirubin: 1.7 mg/dL — ABNORMAL HIGH (ref 0.3–1.2)
Total Protein: 6.9 g/dL (ref 6.5–8.1)

## 2017-04-04 LAB — URINE CULTURE: Culture: NO GROWTH

## 2017-04-04 MED ORDER — DEXTROSE 5 % IV SOLN
1.0000 g | INTRAVENOUS | Status: DC
  Administered 2017-04-04 – 2017-04-05 (×2): 1 g via INTRAVENOUS
  Filled 2017-04-04 (×2): qty 10

## 2017-04-04 NOTE — Progress Notes (Signed)
STROKE TEAM PROGRESS NOTE   HISTORY OF PRESENT ILLNESS (per record) [name] is a 76 y.o. 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  He was admitted to [facility] and was discharged recently.  The family brought the patient back to [facility] 04/02/2017 around 0900 after noticing that he wasn't acting normal. He refused to eat and was not speaking much as well. They tried to give him some pudding, but he started coughing and had difficulty swallowing.  MRI on 04/02/2017 with further propagation of acute RIGHT frontoparietal/MCA territory infarct compared with 03/27/2017.  EEG on 04/03/2017 with global slowing suggesting encephalopathy without seizures.  The patient has a UTI.  Urine culture pending.  LKW: evening of 04/01/2017 Premorbid modified Rankin scale (mRS): 3  Patient was not administered IV t-PA secondary to recent stroke. He was admitted to General Neurology for further evaluation and treatment.   SUBJECTIVE (INTERVAL HISTORY) His daughter is at the bedside.  The patient is awake and alert.He Is more interactive today though he is not speaking yet. His left-sided strength appears to have improved. EEG shows focal right temporal slowing and mild generalized slowing but no definite epileptiform activity   OBJECTIVE Temp:  [98.1 F (36.7 C)-98.5 F (36.9 C)] 98.4 F (36.9 C) (07/12 0908) Pulse Rate:  [66-88] 70 (07/12 0908) Cardiac Rhythm: Normal sinus rhythm (07/12 0400) Resp:  [18-20] 20 (07/12 0908) BP: (124-140)/(72-81) 140/76 (07/12 0908) SpO2:  [98 %-100 %] 99 % (07/12 0908)  CBC:   Recent Labs Lab 04/02/17 1548 04/02/17 1716 04/04/17 0516  WBC 9.8  --  6.4  NEUTROABS 6.9  --   --   HGB 19.5* 18.0* 14.8  HCT 55.0* 53.0* 42.2  MCV 95.2  --  93.8  PLT 205  --  161    Basic Metabolic Panel:   Recent  Labs Lab 04/02/17 1548 04/02/17 1716 04/04/17 0516  NA 139 140 141  K 4.9 4.5 3.6  CL 102 106 111  CO2 19*  --  18*  GLUCOSE 94 84 79  BUN 34* 37* 22*  CREATININE 2.04* 1.60* 1.23  CALCIUM 10.3  --  8.8*    Lipid Panel:     Component Value Date/Time   CHOL 187 03/27/2017 0355   TRIG 97 03/27/2017 0355   HDL 42 03/27/2017 0355   CHOLHDL 4.5 03/27/2017 0355   VLDL 19 03/27/2017 0355   LDLCALC 126 (H) 03/27/2017 0355   HgbA1c:  Lab Results  Component Value Date   HGBA1C 5.6 03/27/2017   Urine Drug Screen: No results found for: LABOPIA, COCAINSCRNUR, LABBENZ, AMPHETMU, THCU, LABBARB  Alcohol Level     Component Value Date/Time   ETH <5 04/02/2017 1548    IMAGING  Ct Head Wo Contrast 04/02/2017 IMPRESSION: 1. No acute intracranial abnormality identified. 2. Stable moderate chronic microvascular ischemic changes and moderate parenchymal volume loss of the brain. 3. Small lucencies corresponding to right MCA distribution foci of infarction on prior MRI.   Mr Brain Wo Contrast 04/02/2017 IMPRESSION: Limited single sequence MRI of the head: Further propagation of acute RIGHT frontoparietal/MCA territory infarct.  Dg Chest Portable 1 View 04/02/2017 IMPRESSION: No radiographic evidence of acute cardiopulmonary disease    EEG 04/03/2017 ; This awake and drowsy EEG is abnormal due to: 1.  Right temporal slowing 2.  diffuse slowing of the waking background  PHYSICAL EXAM  Frail elderly [place]alese origin male not in distress. . Afebrile. Head is nontraumatic. Neck is supple without bruit.    Cardiac exam no murmur or gallop. Lungs are clear to auscultation. Distal pulses are well felt. Neurological Exam ;  Awake  Alert  Patient speaks only Nepalese hence mental status exam is limited. Not following commands or speaking but can track.Marland Kitchen eye movements full without nystagmus.fundi were not visualized. Diminished blink to threat on the left compared to the right. Left lower facial  weakness. Tongue midline. Palatal movements are normal.  . Tongue midline. Normal strength, tone, reflexes and coordination on the right side. Left hemiplegia with left upper and lower extremity 3/5 strength. He will not cooperate for detailed motor system exam. Normal sensation. Gait deferred.  ASSESSMENT/PLAN Mr. Kayne Yuhas is a 76 y.o. 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 He did not receive IV t-PA due to recent stroke.   Stroke: Further propagation of acute RIGHT frontoparietal/MCA territory infarct as well as encephalopathy that is likely secondary to UTI  Resultant  left hemiplegia and altered mental status   CT head: Small lucencies corresponding to right MCA distribution foci of infarction on prior MRI  MRI head: Limited single sequence MRI of the head: Further propagation of acute RIGHT frontoparietal/MCA territory infarct.  MRA head: not ordered  Carotid Doppler (03/27/2017): B ICA 1-39% stenosis, VAs antegrade  2D Echo (03/27/2017): EF 55-60%. No source of embolus   LDL (03/27/2017) 126  HgbA1c ()03/27/2017) 5.6  SCDs for VTE prophylaxis DIET - DYS 1 Room service appropriate? Yes; Fluid consistency: Thin  aspirin 81 mg daily and clopidogrel 75 mg daily prior to admission, now on aspirin 81 mg daily and clopidogrel 75 mg daily  Patient counseled to be compliant with his antithrombotic medications  Ongoing aggressive stroke risk factor management  Therapy recommendations: CIR;Supervision/Assistance - 24 hour  Disposition:   pending  Hypertension  Stable, marginal  Permissive hypertension (OK if < 220/120) but gradually normalize in 5-7 days  Long-term BP goal normotensive  Hyperlipidemia  Home meds:  Atorvastatin 80mg  PO daily, resumed in hospital  LDL (03/27/2017) 126, goal < 70  Continue statin at discharge  Diabetes  HgbA1c  (03/27/2017) 5.6, goal < 7.0  Controlled  Other Stroke Risk Factors  Advanced age  Overweight, Body mass index is 22.48 kg/m., recommend weight loss, diet and exercise as appropriate   Hx stroke/TIA  hx stroke    Other Active Problems  UTI: urine culture sent, treated with ceftriaxone 1g IV once  Hospital day # 2  I have personally examined this patient, reviewed notes, independently viewed imaging studies, participated in medical decision making and plan of care.ROS completed by me personally and pertinent positives fully documented  I have made any additions or clarifications directly to the above note. Patient has presented with worsening of his recent stroke deficits from a week ago without a mental status in the setting of urinary tract infection and dehydration. Continue dual antiplatelet therapy, IV hydration and antibiotics. I do not believe any further stroke workup is necessary as patient had complete evaluation last week. Long discussion with the patient's daughter at the bedside and with Dr. Susie Cassette and answered questions. Greater than 50% time during this 25 minute visit was spent  on counseling and coordination of care about his stroke, neurological worsening, treatment discussion and answering questions. Recommend mobilize out of bed. Therapy and  rehabilitation consults. Stroke team will sign off. Follow-up as an outpatient in stroke clinic with Dr. Roda ShuttersXu in 6 weeks. Delia HeadyPramod Sethi, MD Medical Director [facility] Pager: [phone] 04/04/2017 1:16 PM   To contact Stroke Continuity provider, please refer to [email or web address]. After hours, contact General Neurology

## 2017-04-04 NOTE — Consult Note (Signed)
Physical Medicine and Rehabilitation Consult   Reason for Consult: Left inattention, left sided weakness,  mental status changes and speech deficits Referring Physician: Dr. Susie Cassette.    HPI: [name] is a 76 y.o.  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 Patient with history of dizziness and progressive weakness since fall in 2015.  Per records he was discharged to home 7/5 at min assist level and HH therapy (he declined CIR).  He was readmitted on 04/02/17 with mental status changes, left sided weakness, lack of appetite and nonverbal state.  He was found to have UTI and as well as further propagation of acute right frontoparietal infarct. EEG without evidence of seizures. Neurology felt that patient with encephalopathy due to UTI in setting of progression of L-MCA stroke and to continue ASA/Plavix. Therapy evaluations done and CIR recommended for follow up therapy.   Discussed with physical therapy, patient prefers male therapist. Patient tends to rush and does not concentrate on following proper technique. PT feels that with repetition. This would improve.  Has not been talkative since his stroke, according to daughter. This is a big personality change for him   Review of Systems  Unable to perform ROS: Other  Neurological: Positive for dizziness (chronic).  Psychiatric/Behavioral:       History of depression--stopped taking meds 6 months ago.     Past Medical History:  Diagnosis Date  . Diabetes (HCC)   . Dizziness   . Hypertension     Past Surgical History:  Procedure Laterality Date  . LAPAROSCOPIC APPENDECTOMY  02/21/2012   Procedure: APPENDECTOMY LAPAROSCOPIC;  Surgeon: Cherylynn Ridges, MD;  Location: [facility] OR;  Service: General;  Laterality: N/A;    Family History  Problem Relation Age of Onset  . Diabetes Neg Hx   . Stroke Neg Hx   .  Cancer Neg Hx     Social History: Lives with family. Was independent with cane prior to last stroke. Per  reports he smokes 1-2  Cigarettes/day  in the winter. He has never used smokeless tobacco. Per reports he drinks a beer on occasion. He does not drink alcohol or use drugs.    Allergies: No Known Allergies    Medications Prior to Admission  Medication Sig Dispense Refill  . albuterol (PROVENTIL HFA;VENTOLIN HFA) 108 (90 Base) MCG/ACT inhaler Inhale 2 puffs into the lungs every 6 (six) hours as needed for wheezing or shortness of breath.    Marland Kitchen aspirin EC 81 MG tablet Take 81 mg by mouth daily.    Marland Kitchen atorvastatin (LIPITOR) 80 MG tablet Take 1 tablet (80 mg total) by mouth daily at 6 PM. 30 tablet 0  . clopidogrel (PLAVIX) 75 MG tablet Take 1 tablet (75 mg total) by mouth daily. 30 tablet 0  . fluticasone (FLONASE) 50 MCG/ACT nasal spray Place 2 sprays into both nostrils daily as needed for allergies.  3  . glimepiride (AMARYL) 4 MG tablet Take 4 mg by mouth daily with breakfast.    . lisinopril (PRINIVIL,ZESTRIL) 40 MG tablet Take 40 mg by mouth daily.    Marland Kitchen loratadine (CLARITIN) 10 MG tablet Take 10 mg by mouth daily.     Marland Kitchen losartan-hydrochlorothiazide (HYZAAR) 100-12.5 MG tablet Take 1 tablet by mouth daily.    . meclizine (ANTIVERT) 12.5 MG tablet Take 12.5 mg by mouth 3 (three) times daily as needed for dizziness.    Marland Kitchen  metFORMIN (GLUCOPHAGE) 500 MG tablet Take 500 mg by mouth 2 (two) times daily with a meal.      Home: Home Living Family/patient expects to be discharged to:: Private residence Living Arrangements: Other relatives, Children Available Help at Discharge: Family, Available 24 hours/day Type of Home: House Home Layout: Two level Alternate Level Stairs-Number of Steps: 12 Alternate Level Stairs-Rails: Right Bathroom Shower/Tub: Engineer, manufacturing systemsTub/shower unit Bathroom Toilet: Standard Home Equipment: The ServiceMaster CompanyCane - single point, Bedside commode, Shower seat, Wheelchair - manual, Environmental consultantWalker - 2  wheels Additional Comments: Pt remains very quiet. Background from daughter and previous admission  Lives With: Daughter  Functional History: Prior Function Level of Independence: Independent with assistive device(s) Gait / Transfers Assistance Needed: pt ambulated with use of SPC ADL's / Homemaking Assistance Needed: Pt min A since previous CVA discharge Communication / Swallowing Assistance Needed: Tery Sanfilippoapoli- language Functional Status:  Mobility: Bed Mobility Overal bed mobility: Needs Assistance Bed Mobility: Supine to Sit Supine to sit: Supervision General bed mobility comments: Pt would come to the edge of the bed and sit, no assist needed, use of bed rails and HOB elevated Transfers General transfer comment: Pt declined at this time.       ADL: ADL Overall ADL's : Needs assistance/impaired Eating/Feeding: Minimal assistance, With caregiver independent assisting, Sitting Eating/Feeding Details (indicate cue type and reason): Pt is not eating or drinking right now - even food that the family is bringing from home that would normally entice him Grooming: Set up, Sitting, Wash/dry face Grooming Details (indicate cue type and reason): bed level Upper Body Bathing: Minimal assistance, Bed level Lower Body Bathing: Minimal assistance, Bed level Upper Body Dressing : Minimal assistance, Sitting Lower Body Dressing: Minimal assistance, Sitting/lateral leans Toilet Transfer:  (not attempted this session, STEDY in room) Toilet Transfer Details (indicate cue type and reason): Pt requests male staff assist with bathroom needs Toileting- Clothing Manipulation and Hygiene: Minimal assistance Functional mobility during ADLs:  (Pt declined at this time) General ADL Comments: Culturally Pt prefers male staff to assist with bathing and bathroom needs. OT explained that sometimes we only have male staff available. Pt is very flat, only communicated through wisper and daughter states it is very  garbly.  Cognition: Cognition Overall Cognitive Status: Difficult to assess Arousal/Alertness: Awake/alert Orientation Level: Oriented to person, Oriented to place, Disoriented to time Attention: Focused Focused Attention: Impaired Problem Solving: Impaired Problem Solving Impairment: Functional basic Cognition Arousal/Alertness: Awake/alert Behavior During Therapy: Flat affect Overall Cognitive Status: Difficult to assess Difficult to assess due to: Non-English speaking, Impaired communication   Blood pressure 140/76, pulse 70, temperature 98.4 F (36.9 C), temperature source Axillary, resp. rate 20, height 5\' 7"  (1.702 m), weight 65.1 kg (143 lb 8.3 oz), SpO2 99 %. Physical Exam  Nursing note and vitals reviewed. Constitutional: He appears well-developed and well-nourished.  HENT:  Head: Normocephalic and atraumatic.  Eyes: Pupils are equal, round, and reactive to light. Conjunctivae and EOM are normal.  Neck: Normal range of motion. Neck supple.  Cardiovascular: Normal rate and regular rhythm.   Respiratory: Effort normal and breath sounds normal. No respiratory distress. He has no wheezes.  GI: Soft. Bowel sounds are normal. He exhibits no distension. There is no tenderness.  Musculoskeletal: He exhibits no edema.  Neurological: He is alert.  Kept head down and refused to make eye contact--question cultural component. He does not like male staff per daughter.  Left facial weakness with occasional garbled sounds. Unable to state name. Left inattention with left sided  weakness--question apraxia and sensory deficits.   Skin: Skin is warm and dry.  Psychiatric: His affect is blunt. He is withdrawn. He is noncommunicative.  Motor strength is 5/5 in the right deltoid, biceps, triceps, grip, 3 minus in the left deltoid, biceps, triceps, finger flexors and extensors. Lower extremity strength 5/5 in the right hip flexor, knee extensor, ankle dorsi flexor. 4/5 and left hip flexor,  knee extensor, dorsi flexor. Sensation reported as equal to light touch bilateral upper and lower limbs.  Results for orders placed or performed during the hospital encounter of 04/02/17 (from the past 24 hour(s))  Glucose, capillary     Status: None   Collection Time: 04/03/17 12:09 PM  Result Value Ref Range   Glucose-Capillary 99 65 - 99 mg/dL   Comment 1 Notify RN    Comment 2 Document in Chart   Glucose, capillary     Status: None   Collection Time: 04/03/17  4:50 PM  Result Value Ref Range   Glucose-Capillary 81 65 - 99 mg/dL   Comment 1 Notify RN    Comment 2 Document in Chart   Glucose, capillary     Status: Abnormal   Collection Time: 04/03/17  7:14 PM  Result Value Ref Range   Glucose-Capillary 103 (H) 65 - 99 mg/dL   Comment 1 Notify RN    Comment 2 Document in Chart   Glucose, capillary     Status: None   Collection Time: 04/03/17 11:22 PM  Result Value Ref Range   Glucose-Capillary 72 65 - 99 mg/dL   Comment 1 Notify RN    Comment 2 Document in Chart   Glucose, capillary     Status: None   Collection Time: 04/04/17  3:31 AM  Result Value Ref Range   Glucose-Capillary 99 65 - 99 mg/dL   Comment 1 Notify RN    Comment 2 Document in Chart   Comprehensive metabolic panel     Status: Abnormal   Collection Time: 04/04/17  5:16 AM  Result Value Ref Range   Sodium 141 135 - 145 mmol/L   Potassium 3.6 3.5 - 5.1 mmol/L   Chloride 111 101 - 111 mmol/L   CO2 18 (L) 22 - 32 mmol/L   Glucose, Bld 79 65 - 99 mg/dL   BUN 22 (H) 6 - 20 mg/dL   Creatinine, Ser 1.61 0.61 - 1.24 mg/dL   Calcium 8.8 (L) 8.9 - 10.3 mg/dL   Total Protein 6.9 6.5 - 8.1 g/dL   Albumin 3.4 (L) 3.5 - 5.0 g/dL   AST 76 (H) 15 - 41 U/L   ALT 67 (H) 17 - 63 U/L   Alkaline Phosphatase 49 38 - 126 U/L   Total Bilirubin 1.7 (H) 0.3 - 1.2 mg/dL   GFR calc non Af Amer 56 (L) >60 mL/min   GFR calc Af Amer >60 >60 mL/min   Anion gap 12 5 - 15  CBC     Status: None   Collection Time: 04/04/17  5:16 AM    Result Value Ref Range   WBC 6.4 4.0 - 10.5 K/uL   RBC 4.50 4.22 - 5.81 MIL/uL   Hemoglobin 14.8 13.0 - 17.0 g/dL   HCT 09.6 04.5 - 40.9 %   MCV 93.8 78.0 - 100.0 fL   MCH 32.9 26.0 - 34.0 pg   MCHC 35.1 30.0 - 36.0 g/dL   RDW 81.1 91.4 - 78.2 %   Platelets 161 150 - 400 K/uL  Glucose, capillary  Status: None   Collection Time: 04/04/17  8:09 AM  Result Value Ref Range   Glucose-Capillary 80 65 - 99 mg/dL   Ct Head Wo Contrast  Result Date: 04/02/2017 CLINICAL DATA:  76 y/o  M; altered mental status. EXAM: CT HEAD WITHOUT CONTRAST TECHNIQUE: Contiguous axial images were obtained from the base of the skull through the vertex without intravenous contrast. COMPARISON:  03/27/2017 MRI head.  03/26/2017 CT head. FINDINGS: Brain: No evidence of acute infarction, hemorrhage, hydrocephalus, extra-axial collection or mass lesion/mass effect. Moderate chronic microvascular ischemic changes of white matter parenchymal volume loss of the brain. Lucencies in the right MCA watershed distribution corresponding to infarcts on prior MRI noted. Vascular: Calcific atherosclerosis of the carotid siphons. Skull: Normal. Negative for fracture or focal lesion. Sinuses/Orbits: 9 mm left anterior ethmoid osteoma. Visualized paranasal sinuses are otherwise clear. Orbits are unremarkable. Other: None. IMPRESSION: 1. No acute intracranial abnormality identified. 2. Stable moderate chronic microvascular ischemic changes and moderate parenchymal volume loss of the brain. 3. Small lucencies corresponding to right MCA distribution foci of infarction on prior MRI. Electronically Signed   By: Mitzi Hansen M.D.   On: 04/02/2017 15:23   Mr Brain Wo Contrast  Result Date: 04/02/2017 CLINICAL DATA:  Altered mental status, now nonverbal. History of stroke and LEFT-sided deficits. History of M1 occlusion, hypertension and diabetes. EXAM: MRI HEAD WITHOUT CONTRAST TECHNIQUE: Single axial diffusion weighted sequence.  Patient was unable to tolerate further imaging in attempted to extract cell from scanner. COMPARISON:  CT HEAD April 02, 2017 and MRI of the head March 27, 2017 FINDINGS: Brain: Patchy reduced diffusion RIGHT frontoparietal lobes, further propagation from prior examination with low ADC values. No midline shift, or mass effect. No hydrocephalus. No abnormal extra-axial fluid collections. Vascular: Not assessed Skull and upper cervical spine: Not assessed Sinuses/Orbits: Not assessed Other: Not applicable IMPRESSION: Limited single sequence MRI of the head: Further propagation of acute RIGHT frontoparietal/MCA territory infarct. Electronically Signed   By: Awilda Metro M.D.   On: 04/02/2017 21:48   Dg Chest Portable 1 View  Result Date: 04/02/2017 CLINICAL DATA:  76 year old male with a history of altered mental status EXAM: PORTABLE CHEST 1 VIEW COMPARISON:  03/27/2017 FINDINGS: Cardiomediastinal silhouette unchanged in size and contour. No pneumothorax or pleural effusion. No confluent airspace disease. Coarsened interstitial markings similar to prior. No displaced fracture IMPRESSION: No radiographic evidence of acute cardiopulmonary disease Electronically Signed   By: Gilmer Mor D.O.   On: 04/02/2017 15:31    Assessment/Plan: Diagnosis: Right MCA distribution infarct with left hemiparesis and cognitive deficits 1. Does the need for close, 24 hr/day medical supervision in concert with the patient's rehab needs make it unreasonable for this patient to be served in a less intensive setting? Yes 2. Co-Morbidities requiring supervision/potential complications: Diabetes, depression, hypertension 3. Due to bladder management, bowel management, safety, skin/wound care, disease management, medication administration and patient education, does the patient require 24 hr/day rehab nursing? Yes 4. Does the patient require coordinated care of a physician, rehab nurse, PT (1-2 hrs/day, 5 days/week), OT (1-2  hrs/day, 5 days/week) and SLP (.5-1 hrs/day, 5 days/week) to address physical and functional deficits in the context of the above medical diagnosis(es)? Yes Addressing deficits in the following areas: balance, endurance, locomotion, strength, transferring, bowel/bladder control, bathing, dressing, feeding, grooming, toileting, cognition, speech, language and psychosocial support 5. Can the patient actively participate in an intensive therapy program of at least 3 hrs of therapy per day at least 5  days per week? Yes 6. The potential for patient to make measurable gains while on inpatient rehab is good 7. Anticipated functional outcomes upon discharge from inpatient rehab are modified independent and supervision  with PT, modified independent and supervision with OT, modified independent and supervision with SLP. 8. Estimated rehab length of stay to reach the above functional goals is: 10-12d 9. Anticipated D/C setting: Home 10. Anticipated post D/C treatments: HH therapy 11. Overall Rehab/Functional Prognosis: excellent  RECOMMENDATIONS: This patient's condition is appropriate for continued rehabilitative care in the following setting: CIR Patient has agreed to participate in recommended program. Potentially Note that insurance prior authorization may be required for reimbursement for recommended care.  CommentJerene Pitch 04/04/2017

## 2017-04-04 NOTE — Progress Notes (Signed)
[facility] PROGRESS NOTE  [name] WUJ:[id number] DOB: [birth year] DOA: 04/02/2017   PCP: Fleet Contras, MD     Assessment/Plan: Active Problems:   CVA (cerebral vascular accident) (HCC)   AKI (acute kidney injury) (HCC)   DM (diabetes mellitus) (HCC)   Elevated transaminase level   Altered mental state   Confusion   76 y.o. male with a past medical history diabetes, hypertension and hyperlipidemia who had a  right MCA watershed stroke on 7/4 for which she was admitted to [facility] and was discharged recently, was being brought back by family because the patient woke up today around 9 AM and wasn't acting normal. He refused to eat and was not speaking much as well.  Patient brought back due to recurrent symptoms/altered mental status. Neurology consulted  Assessment and plan Acute metabolic encephalopathy in the setting of UTI, recent CVA: Right MCA scattered infarcts due to intracranial large vessel stenosis including right M1 occlusion, left MCA and bilateral PCA high-grade stenosis.   Resultant mild left arm pronator drift and dysmetria  CT head: Hyperdense left M2 branch possibly related to acute thrombosis versus atherosclerotic changes.   MRIhead: Right MCA scattered acute infarct,white matter infarcts and deep watershed pattern  MRAhead: Right M1 occlusion, Advanced intracranial atherosclerosis with moderate bilateral ICA and proximal basilar stenosis. High-grade left MCA bifurcation and bilateral P2 segment stenoses.  CarotidDoppler: Unremarkable  2D Echo: EF 55-60%. No source of embolus  LDL126  HgbA1c5.6   Patient discharged on   aspirin 81 mg daily and clopidogrel 75 mg daily. Continue DAPT for 3 months and then Plavix alone due to intracranial stenosis.  Patient may need CIR, consult requested Patient returned due to difficulty swallowing-speech therapy evaluation 7/11-dysphagia 1, pured diet, thin liquids MRI  brain on 7/11 showed further propagation of the acute right frontoparietal MCA infarct,  Chest x-ray did not show any radiographic abnormality Patient also has urinary tract infection which needs to be treated  UTI Started on Rocephin Follow urine culture   Acute kidney injury Discharge creatinine 0.69, presented with a creatinine of 2.04, likely secondary to dehydration, slightly improved today, continue hydration and follow renal function closely, approaching baseline   Intracranial stenosis  MRA head right M1 clot occlusion, left M1 high-grade stenosis, bilateral PCA high-grade stenosis  On DAPTfor intracranial stenosis     Elevated TSH - Free T4 and T3 within normal limits as such will recommend patient have TSH reassessed within 4-6 weeks.  Hypertension - Hold Cozaar/HCTZ secondary to AKI   Hyperlipidemia - LDL 126 with goal less than 70, continue atorvastatin 80 mg by mouth daily this admission.  DM Hemoglobin A1c 5.6 Metformin/Amaryl on hold as his CBGs have been low    DVT prophylaxsis heparin  Code Status:  Full code    Family Communication: Discussed in detail with the patient/daughter, all imaging results, lab results explained to the patient   Disposition Plan:  CIR versus home     Consultants:  Neurology  Procedures:  None  Antibiotics: Anti-infectives    Start     Dose/Rate Route Frequency Ordered Stop   04/03/17 1315  cefTRIAXone (ROCEPHIN) 1 g in dextrose 5 % 50 mL IVPB     1 g 100 mL/hr over 30 Minutes Intravenous  Once 04/03/17 1305 04/03/17 1452         HPI/Subjective:  Very slow to respond, somewhat more awake, as per daughter does not talk much  Objective: Vitals:  04/03/17 2049 04/04/17 0020 04/04/17 0516 04/04/17 0908  BP: 137/81 133/76 139/72 140/76  Pulse: 83 88 81 70  Resp: 20 18 20 20   Temp: 98.2 F (36.8 C) 98.5 F (36.9 C) 98.2 F (36.8 C) 98.4 F (36.9 C)  TempSrc: Oral Oral Oral Axillary  SpO2:  99% 99% 98% 99%  Weight:      Height:        Intake/Output Summary (Last 24 hours) at 04/04/17 1037 Last data filed at 04/04/17 0600  Gross per 24 hour  Intake             1430 ml  Output                0 ml  Net             1430 ml    Exam:  Examination:  General exam: awake and aware of his surroundings Respiratory system: Clear to auscultation. Respiratory effort normal. Cardiovascular system: S1 & S2 heard, RRR. No JVD, murmurs, rubs, gallops or clicks. No pedal edema. Gastrointestinal system: Abdomen is nondistended, soft and nontender. No organomegaly or masses felt. Normal bowel sounds heard. Central nervous system: Not following commands Extremities: Symmetric 5 x 5 power. Skin: No rashes, lesions or ulcers Psychiatry: Judgement and insight impaired    Data Reviewed: I have personally reviewed following labs and imaging studies  Micro Results Recent Results (from the past 240 hour(s))  Culture, blood (routine x 2)     Status: None (Preliminary result)   Collection Time: 04/02/17  2:49 PM  Result Value Ref Range Status   Specimen Description BLOOD RIGHT ANTECUBITAL  Final   Special Requests   Final    BOTTLES DRAWN AEROBIC ONLY Blood Culture adequate volume   Culture NO GROWTH < 24 HOURS  Final   Report Status PENDING  Incomplete    Radiology Reports Dg Chest 2 View  Result Date: 03/27/2017 CLINICAL DATA:  Dizziness and left-sided weakness. EXAM: CHEST  2 VIEW COMPARISON:  02/21/2012 FINDINGS: Lungs are adequately inflated without focal consolidation or effusion. Patient slightly rotated to the left. Cardiomediastinal silhouette is within normal. Subtle calcified plaque over the aortic arch. Mild degenerate change of the spine. IMPRESSION: No acute cardiopulmonary disease. Minimal aortic atherosclerosis. Electronically Signed   By: Elberta Fortis M.D.   On: 03/27/2017 07:50   Ct Head Wo Contrast  Result Date: 04/02/2017 CLINICAL DATA:  76 y/o  M; altered mental  status. EXAM: CT HEAD WITHOUT CONTRAST TECHNIQUE: Contiguous axial images were obtained from the base of the skull through the vertex without intravenous contrast. COMPARISON:  03/27/2017 MRI head.  03/26/2017 CT head. FINDINGS: Brain: No evidence of acute infarction, hemorrhage, hydrocephalus, extra-axial collection or mass lesion/mass effect. Moderate chronic microvascular ischemic changes of white matter parenchymal volume loss of the brain. Lucencies in the right MCA watershed distribution corresponding to infarcts on prior MRI noted. Vascular: Calcific atherosclerosis of the carotid siphons. Skull: Normal. Negative for fracture or focal lesion. Sinuses/Orbits: 9 mm left anterior ethmoid osteoma. Visualized paranasal sinuses are otherwise clear. Orbits are unremarkable. Other: None. IMPRESSION: 1. No acute intracranial abnormality identified. 2. Stable moderate chronic microvascular ischemic changes and moderate parenchymal volume loss of the brain. 3. Small lucencies corresponding to right MCA distribution foci of infarction on prior MRI. Electronically Signed   By: Mitzi Hansen M.D.   On: 04/02/2017 15:23   Ct Head Wo Contrast  Result Date: 03/26/2017 CLINICAL DATA:  77 year old hypertensive male with dizziness since  last night. Bilateral arm and leg numbness. Facial droop this morning. EXAM: CT HEAD WITHOUT CONTRAST TECHNIQUE: Contiguous axial images were obtained from the base of the skull through the vertex without intravenous contrast. COMPARISON:  None. FINDINGS: Brain: No intracranial hemorrhage. Slightly hyperdense left middle cerebral artery M2 branch possibly related to acute thrombosis versus atherosclerotic changes. No other CT evidence of large acute infarct. Prominent chronic microvascular changes. This limits evaluation for detection of small or moderate-size infarct. Global atrophy. No intracranial mass lesion noted on this unenhanced exam. Vascular: Vascular calcifications in  addition to above findings. Skull: No acute abnormality. Sinuses/Orbits: No acute orbital abnormality. Minimal mucosal thickening right maxillary sinus and ethmoid sinus air cells. Other: Mastoid air cells and middle ear cavities are clear. IMPRESSION: No intracranial hemorrhage. Slightly hyperdense left middle cerebral artery M2 branch possibly related to acute thrombosis versus atherosclerotic changes. No other CT evidence of large acute infarct. Prominent chronic microvascular changes. This limits evaluation for detection of small or moderate-size infarct. Global atrophy. Electronically Signed   By: Lacy DuverneySteven  Olson M.D.   On: 03/26/2017 19:07   Mr Brain Wo Contrast  Result Date: 04/02/2017 CLINICAL DATA:  Altered mental status, now nonverbal. History of stroke and LEFT-sided deficits. History of M1 occlusion, hypertension and diabetes. EXAM: MRI HEAD WITHOUT CONTRAST TECHNIQUE: Single axial diffusion weighted sequence. Patient was unable to tolerate further imaging in attempted to extract cell from scanner. COMPARISON:  CT HEAD April 02, 2017 and MRI of the head March 27, 2017 FINDINGS: Brain: Patchy reduced diffusion RIGHT frontoparietal lobes, further propagation from prior examination with low ADC values. No midline shift, or mass effect. No hydrocephalus. No abnormal extra-axial fluid collections. Vascular: Not assessed Skull and upper cervical spine: Not assessed Sinuses/Orbits: Not assessed Other: Not applicable IMPRESSION: Limited single sequence MRI of the head: Further propagation of acute RIGHT frontoparietal/MCA territory infarct. Electronically Signed   By: Awilda Metroourtnay  Bloomer M.D.   On: 04/02/2017 21:48   Mr Brain Wo Contrast (neuro Protocol)  Result Date: 03/27/2017 CLINICAL DATA:  Left-sided weakness. EXAM: MRI HEAD WITHOUT CONTRAST MRA HEAD WITHOUT CONTRAST TECHNIQUE: Multiplanar, multiecho pulse sequences of the brain and surrounding structures were obtained without intravenous contrast.  Angiographic images of the head were obtained using MRA technique without contrast. COMPARISON:  Head CT from yesterday FINDINGS: MRI HEAD FINDINGS Brain: Moderate degree of patchy acute infarct in the right cerebral white matter, roughly aligned along the ventricular margin, MCA deep watershed pattern. At least 1 tiny cortical infarct is present along the frontal operculum. No separate distribution infarct noted. No hemorrhagic conversion. There is a background of extensive chronic microvascular ischemic gliosis in the cerebral white matter. No hydrocephalus or masslike findings. On SWI, asymmetric prominence of right sided sulcal vessels is likely from slow flow and deoxygenation of blood in the setting of proximal MCA occlusion and presumed extensive pial pial collaterals. Patient is at least 3 days post ictus. Vascular: Abnormal right M1 segment flow void, see below. Skull and upper cervical spine: Negative for marrow lesion. Sinuses/Orbits: No acute finding MRA HEAD FINDINGS Comparatively small right ICA with less intense enhancement, with moderate stenosis at the anterior genu cavernous segment. Right M1 cut off. There is moderate narrowing of the proximal right A1 segment. Left A3 segment moderate to advanced stenosis. The left supraclinoid ICA is moderately narrowed. There is early branching left MCA with high-grade stenosis at the bifurcation. Left dominant vertebral artery. Proximal basilar stenosis due to shelf-like plaque, moderate narrowing. There is high-grade  narrowing of bilateral P2 segments, worse on the left. Negative for aneurysm. Case discussed with Dr. Amada Jupiter at time of interpretation. IMPRESSION: 1. Right M1 occlusion with comparatively mild downstream white matter infarcts, deep watershed pattern. 2. Advanced intracranial atherosclerosis with moderate bilateral ICA and proximal basilar stenosis. High-grade left MCA bifurcation and bilateral P2 segment stenoses. 3. Advanced chronic small  vessel ischemia in the cerebral white matter. Electronically Signed   By: Marnee Spring M.D.   On: 03/27/2017 19:04   Dg Chest Portable 1 View  Result Date: 04/02/2017 CLINICAL DATA:  76 year old male with a history of altered mental status EXAM: PORTABLE CHEST 1 VIEW COMPARISON:  03/27/2017 FINDINGS: Cardiomediastinal silhouette unchanged in size and contour. No pneumothorax or pleural effusion. No confluent airspace disease. Coarsened interstitial markings similar to prior. No displaced fracture IMPRESSION: No radiographic evidence of acute cardiopulmonary disease Electronically Signed   By: Gilmer Mor D.O.   On: 04/02/2017 15:31   Mr Maxine Glenn Head/brain ZO Cm  Result Date: 03/27/2017 CLINICAL DATA:  Left-sided weakness. EXAM: MRI HEAD WITHOUT CONTRAST MRA HEAD WITHOUT CONTRAST TECHNIQUE: Multiplanar, multiecho pulse sequences of the brain and surrounding structures were obtained without intravenous contrast. Angiographic images of the head were obtained using MRA technique without contrast. COMPARISON:  Head CT from yesterday FINDINGS: MRI HEAD FINDINGS Brain: Moderate degree of patchy acute infarct in the right cerebral white matter, roughly aligned along the ventricular margin, MCA deep watershed pattern. At least 1 tiny cortical infarct is present along the frontal operculum. No separate distribution infarct noted. No hemorrhagic conversion. There is a background of extensive chronic microvascular ischemic gliosis in the cerebral white matter. No hydrocephalus or masslike findings. On SWI, asymmetric prominence of right sided sulcal vessels is likely from slow flow and deoxygenation of blood in the setting of proximal MCA occlusion and presumed extensive pial pial collaterals. Patient is at least 3 days post ictus. Vascular: Abnormal right M1 segment flow void, see below. Skull and upper cervical spine: Negative for marrow lesion. Sinuses/Orbits: No acute finding MRA HEAD FINDINGS Comparatively small  right ICA with less intense enhancement, with moderate stenosis at the anterior genu cavernous segment. Right M1 cut off. There is moderate narrowing of the proximal right A1 segment. Left A3 segment moderate to advanced stenosis. The left supraclinoid ICA is moderately narrowed. There is early branching left MCA with high-grade stenosis at the bifurcation. Left dominant vertebral artery. Proximal basilar stenosis due to shelf-like plaque, moderate narrowing. There is high-grade narrowing of bilateral P2 segments, worse on the left. Negative for aneurysm. Case discussed with Dr. Amada Jupiter at time of interpretation. IMPRESSION: 1. Right M1 occlusion with comparatively mild downstream white matter infarcts, deep watershed pattern. 2. Advanced intracranial atherosclerosis with moderate bilateral ICA and proximal basilar stenosis. High-grade left MCA bifurcation and bilateral P2 segment stenoses. 3. Advanced chronic small vessel ischemia in the cerebral white matter. Electronically Signed   By: Marnee Spring M.D.   On: 03/27/2017 19:04     CBC  Recent Labs Lab 04/02/17 1548 04/02/17 1716 04/04/17 0516  WBC 9.8  --  6.4  HGB 19.5* 18.0* 14.8  HCT 55.0* 53.0* 42.2  PLT 205  --  161  MCV 95.2  --  93.8  MCH 33.7  --  32.9  MCHC 35.5  --  35.1  RDW 13.1  --  12.8  LYMPHSABS 1.7  --   --   MONOABS 1.1*  --   --   EOSABS 0.1  --   --  BASOSABS 0.1  --   --     Chemistries   Recent Labs Lab 04/02/17 1548 04/02/17 1716 04/04/17 0516  NA 139 140 141  K 4.9 4.5 3.6  CL 102 106 111  CO2 19*  --  18*  GLUCOSE 94 84 79  BUN 34* 37* 22*  CREATININE 2.04* 1.60* 1.23  CALCIUM 10.3  --  8.8*  AST 97*  --  76*  ALT 82*  --  67*  ALKPHOS 69  --  49  BILITOT 2.9*  --  1.7*   ------------------------------------------------------------------------------------------------------------------ estimated creatinine clearance is 47.8 mL/min (by C-G formula based on SCr of 1.23  mg/dL). ------------------------------------------------------------------------------------------------------------------ No results for input(s): HGBA1C in the last 72 hours. ------------------------------------------------------------------------------------------------------------------ No results for input(s): CHOL, HDL, LDLCALC, TRIG, CHOLHDL, LDLDIRECT in the last 72 hours. ------------------------------------------------------------------------------------------------------------------ No results for input(s): TSH, T4TOTAL, T3FREE, THYROIDAB in the last 72 hours.  Invalid input(s): FREET3 ------------------------------------------------------------------------------------------------------------------ No results for input(s): VITAMINB12, FOLATE, FERRITIN, TIBC, IRON, RETICCTPCT in the last 72 hours.  Coagulation profile  Recent Labs Lab 04/02/17 1548  INR 1.03    No results for input(s): DDIMER in the last 72 hours.  Cardiac Enzymes No results for input(s): CKMB, TROPONINI, MYOGLOBIN in the last 168 hours.  Invalid input(s): CK ------------------------------------------------------------------------------------------------------------------ Invalid input(s): POCBNP   CBG:  Recent Labs Lab 04/03/17 1650 04/03/17 1914 04/03/17 2322 04/04/17 0331 04/04/17 0809  GLUCAP 81 103* 72 99 80       Studies: Ct Head Wo Contrast  Result Date: 04/02/2017 CLINICAL DATA:  76 y/o  M; altered mental status. EXAM: CT HEAD WITHOUT CONTRAST TECHNIQUE: Contiguous axial images were obtained from the base of the skull through the vertex without intravenous contrast. COMPARISON:  03/27/2017 MRI head.  03/26/2017 CT head. FINDINGS: Brain: No evidence of acute infarction, hemorrhage, hydrocephalus, extra-axial collection or mass lesion/mass effect. Moderate chronic microvascular ischemic changes of white matter parenchymal volume loss of the brain. Lucencies in the right MCA watershed  distribution corresponding to infarcts on prior MRI noted. Vascular: Calcific atherosclerosis of the carotid siphons. Skull: Normal. Negative for fracture or focal lesion. Sinuses/Orbits: 9 mm left anterior ethmoid osteoma. Visualized paranasal sinuses are otherwise clear. Orbits are unremarkable. Other: None. IMPRESSION: 1. No acute intracranial abnormality identified. 2. Stable moderate chronic microvascular ischemic changes and moderate parenchymal volume loss of the brain. 3. Small lucencies corresponding to right MCA distribution foci of infarction on prior MRI. Electronically Signed   By: Mitzi Hansen M.D.   On: 04/02/2017 15:23   Mr Brain Wo Contrast  Result Date: 04/02/2017 CLINICAL DATA:  Altered mental status, now nonverbal. History of stroke and LEFT-sided deficits. History of M1 occlusion, hypertension and diabetes. EXAM: MRI HEAD WITHOUT CONTRAST TECHNIQUE: Single axial diffusion weighted sequence. Patient was unable to tolerate further imaging in attempted to extract cell from scanner. COMPARISON:  CT HEAD April 02, 2017 and MRI of the head March 27, 2017 FINDINGS: Brain: Patchy reduced diffusion RIGHT frontoparietal lobes, further propagation from prior examination with low ADC values. No midline shift, or mass effect. No hydrocephalus. No abnormal extra-axial fluid collections. Vascular: Not assessed Skull and upper cervical spine: Not assessed Sinuses/Orbits: Not assessed Other: Not applicable IMPRESSION: Limited single sequence MRI of the head: Further propagation of acute RIGHT frontoparietal/MCA territory infarct. Electronically Signed   By: Awilda Metro M.D.   On: 04/02/2017 21:48   Dg Chest Portable 1 View  Result Date: 04/02/2017 CLINICAL DATA:  76 year old male with a history of altered mental  status EXAM: PORTABLE CHEST 1 VIEW COMPARISON:  03/27/2017 FINDINGS: Cardiomediastinal silhouette unchanged in size and contour. No pneumothorax or pleural effusion. No confluent  airspace disease. Coarsened interstitial markings similar to prior. No displaced fracture IMPRESSION: No radiographic evidence of acute cardiopulmonary disease Electronically Signed   By: Gilmer Mor D.O.   On: 04/02/2017 15:31      Lab Results  Component Value Date   HGBA1C 5.6 03/27/2017   Lab Results  Component Value Date   MICROALBUR 0.50 08/28/2010   LDLCALC 126 (H) 03/27/2017   CREATININE 1.23 04/04/2017       Scheduled Meds: . aspirin  81 mg Oral Once  . aspirin EC  81 mg Oral Daily  . atorvastatin  80 mg Oral Daily  . clopidogrel  75 mg Oral Daily  . cyanocobalamin  1,000 mcg Intramuscular Daily  . heparin  5,000 Units Subcutaneous Q8H  . insulin aspart  0-9 Units Subcutaneous Q4H   Continuous Infusions: . sodium chloride 100 mL/hr at 04/04/17 0400     LOS: 2 days    Time spent: >30 MINS    Richarda Overlie  [facility] Pager [phone]. If 7PM-7AM, please contact night-coverage at [email or web address], password [facility] 04/04/2017, 10:37 AM  LOS: 2 days

## 2017-04-04 NOTE — Progress Notes (Signed)
Physical Therapy Treatment Patient Details Name: [name] MRN: [id number] DOB: 10/03/1940 Today's Date: 04/04/2017    History of Present Illness Pt is a 76 y/o male admitted secondary to difficulty with speech and eating. 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 Of note, pt was recently hospitalized from 7/2-7/5 with an acute R MCA CVA. PMH including but not limited to DM and HTN.    PT Comments    Pt participated throughout session.  Emphasis on getting to EOB addressing assist with L UE, sit to stand and gait quality/stability using the RW.  Pt's ability to follow tactile cues of translated verbal cues not allowing for efficient facilitation of normalized movement, but I expect if a rapport is built with the same person, but will learn to accept or understand the assist/cuing.    Follow Up Recommendations  CIR;Supervision/Assistance - 24 hour     Equipment Recommendations  None recommended by PT    Recommendations for Other Services Rehab consult     Precautions / Restrictions Precautions Precautions: Fall Precaution Comments: left inattention    Mobility  Bed Mobility Overal bed mobility: Needs Assistance Bed Mobility: Supine to Sit     Supine to sit: Supervision     General bed mobility comments: pt comes to EOB, but inattentive to L UE and does not accept L UE facilitation well.  Transfers Overall transfer level: Needs assistance Equipment used: Rolling walker (2 wheeled);Straight cane Transfers: Sit to/from Stand Sit to Stand: Min assist            Ambulation/Gait Ambulation/Gait assistance: Mod assist Ambulation Distance (Feet): 35 Feet (then addt, 35', 30' and 30' with rests in between) Assistive device: Rolling walker (2 wheeled);Straight cane Gait Pattern/deviations: Step-through pattern;Decreased step length - left;Decreased stance time - right;Decreased stance time - left;Decreased stride length Gait velocity:  decreased Gait velocity interpretation: Below normal speed for age/gender General Gait Details: Gait hemiparetic in nature with variable, but decreased advancement of L LE, decreased w/shift to the right.  pt drifts L into the RW which is being over-powered to the R with the stronger R side.  Pt appears resistant to facilitation of "normal" movement   Stairs            Wheelchair Mobility    Modified Rankin (Stroke Patients Only) Modified Rankin (Stroke Patients Only) Pre-Morbid Rankin Score: Moderately severe disability Modified Rankin: Moderately severe disability     Balance Overall balance assessment: Needs assistance Sitting-balance support: Feet supported;No upper extremity supported Sitting balance-Leahy Scale: Good     Standing balance support: Bilateral upper extremity supported;Single extremity supported Standing balance-Leahy Scale: Poor Standing balance comment: needs min assist for stability                            Cognition Arousal/Alertness: Awake/alert Behavior During Therapy: Flat affect Overall Cognitive Status: Difficult to assess Area of Impairment: Following commands                       Following Commands: Follows one step commands with increased time       General Comments: pt either resents taking direction or doesn't see (understand) the importance of following the directions.  It is difficult to discern and daughter not giving insight.      Exercises      General Comments General comments (skin integrity, edema, etc.): Still with R gaze preference, but can track the therapist  to the left of midline.      Pertinent Vitals/Pain Pain Assessment: Faces Faces Pain Scale: No hurt    Home Living                      Prior Function            PT Goals (current goals can now be found in the care plan section) Acute Rehab PT Goals Patient Stated Goal: to get Dad more independent PT Goal Formulation:  Patient unable to participate in goal setting Time For Goal Achievement: 04/17/17 Potential to Achieve Goals: Good Progress towards PT goals: Progressing toward goals    Frequency    Min 4X/week      PT Plan Current plan remains appropriate    Co-evaluation              AM-PAC PT "6 Clicks" Daily Activity  Outcome Measure  Difficulty turning over in bed (including adjusting bedclothes, sheets and blankets)?: A Little Difficulty moving from lying on back to sitting on the side of the bed? : A Little Difficulty sitting down on and standing up from a chair with arms (e.g., wheelchair, bedside commode, etc,.)?: Total Help needed moving to and from a bed to chair (including a wheelchair)?: A Little Help needed walking in hospital room?: A Lot Help needed climbing 3-5 steps with a railing? : A Lot 6 Click Score: 14    End of Session   Activity Tolerance: Patient tolerated treatment well Patient left: in bed;with call bell/phone within reach;with family/visitor present;with bed alarm set Nurse Communication: Mobility status PT Visit Diagnosis: Unsteadiness on feet (R26.81);Other abnormalities of gait and mobility (R26.89);Other symptoms and signs involving the nervous system (R29.898)     Time: 1458-1530 PT Time Calculation (min) (ACUTE ONLY): 32 min  Charges:  $Gait Training: 8-22 mins $Therapeutic Activity: 8-22 mins                    G Codes:       April 27, 2017  [place] Bing, PT [phone]  (pager)   Eliseo Gum Kiosha Buchan 04/27/17, 4:09 PM

## 2017-04-04 NOTE — Progress Notes (Signed)
Pt will not responsive with male staff. This nurse requested assistance from male manager to administer medication because pt refused to accept medication. Medication administered. His daughter was able to give him food from home because he refused to eat his meal.

## 2017-04-04 NOTE — Progress Notes (Signed)
Pt and family noncompliant with calling staff for transfers. Pt will not allow male staff to assist. Daughter states that due to pt culture, pt does not respond to male staff. Pt and family educated on the importance of calling staff to assist because pt is very unsteady on his feet. Safety measures are in place. Call bell within reach. Md was notified. Will continue to monitor.

## 2017-04-04 NOTE — Progress Notes (Addendum)
  Speech Language Pathology Treatment: Dysphagia  Patient Details Name: [name] MRN: [id number] DOB: 04/07/1941 Today's Date: 04/04/2017 Time: [id number] SLP Time Calculation (min) (ACUTE ONLY): 13 min  Assessment / Plan / Recommendation Clinical Impression  Pt seen for assessment of diet tolerance. Family reports no difficulty with food brought in from home, however, pt has had poor intake. Pt was observed to tolerate thin liquid and puree consistencies without overt s/s aspiration. Minimal mouth opening noted during trials, whether family or SLP providing bolus. Pt noted to have poor eye contact, flat affect, and appeared lethargic. His behavior was not indicative of strong desire to participate with therapist - ? Language barrier, lethargy? Family indicated pt wants to get up and walk. Pt speaks no English, but was able to answer questions asked by family re: pain.     HPI HPI: Bradley Edouardurna Gurungis a 76 y.o.malewith medical history significant of recent CVA, DM and HTN. He was hospitalized from 7/2-7/5 with new CVA. Work up was completed and patient was started on ASA/plavix. Daughter states that patient just today woke up and would not eat and per her, was not acting his normal. Per daughter, patient was eating up to this AM but not much. They were feeding him pudding and he was coughing when he took in food. MRI reveal further propagation of acute RIGHT frontoparietal/MCA territory infarct. Of note, pt with previous admission on 03/28/17 for Left MCA CVA. At that time, pt evaluated for ST services with evaluation revealing mild dysarthria, cues for basic problem solving and left-sided awareness      SLP Plan  Continue with current plan of care       Recommendations  Diet recommendations: Dysphagia 1 (puree);Thin liquid Liquids provided via: Cup;Teaspoon Medication Administration: Crushed with puree Supervision: Intermittent supervision to cue for compensatory strategies Compensations:  Minimize environmental distractions;Slow rate;Small sips/bites Postural Changes and/or Swallow Maneuvers: Seated upright 90 degrees         Oral Care Recommendations: Oral care BID Follow up Recommendations: Home health SLP;24 hour supervision/assistance SLP Visit Diagnosis: Cognitive communication deficit (Y78.295(R41.841) Plan: Continue with current plan of care       [name], Arapah[facility] Surgicenter LLCMSP, CCC-SLP [phone]  [name], [name] 04/04/2017, 12:36 PM

## 2017-04-05 ENCOUNTER — Encounter (HOSPITAL_COMMUNITY): Payer: Self-pay | Admitting: *Deleted

## 2017-04-05 ENCOUNTER — Inpatient Hospital Stay (HOSPITAL_COMMUNITY)
Admission: RE | Admit: 2017-04-05 | Discharge: 2017-04-11 | DRG: 056 | Disposition: A | Discharge status: Home Health | Payer: Medicaid Other | Source: Intra-hospital | Attending: Physical Medicine & Rehabilitation | Admitting: Physical Medicine & Rehabilitation

## 2017-04-05 DIAGNOSIS — G9340 Encephalopathy, unspecified: Secondary | ICD-10-CM | POA: Diagnosis present

## 2017-04-05 DIAGNOSIS — I69352 Hemiplegia and hemiparesis following cerebral infarction affecting left dominant side: Secondary | ICD-10-CM | POA: Diagnosis present

## 2017-04-05 DIAGNOSIS — G8194 Hemiplegia, unspecified affecting left nondominant side: Secondary | ICD-10-CM

## 2017-04-05 DIAGNOSIS — R1312 Dysphagia, oropharyngeal phase: Secondary | ICD-10-CM

## 2017-04-05 DIAGNOSIS — Z9181 History of falling: Secondary | ICD-10-CM

## 2017-04-05 DIAGNOSIS — N390 Urinary tract infection, site not specified: Secondary | ICD-10-CM | POA: Diagnosis present

## 2017-04-05 DIAGNOSIS — F17210 Nicotine dependence, cigarettes, uncomplicated: Secondary | ICD-10-CM | POA: Diagnosis present

## 2017-04-05 DIAGNOSIS — F329 Major depressive disorder, single episode, unspecified: Secondary | ICD-10-CM | POA: Diagnosis present

## 2017-04-05 DIAGNOSIS — E785 Hyperlipidemia, unspecified: Secondary | ICD-10-CM | POA: Diagnosis present

## 2017-04-05 DIAGNOSIS — I63512 Cerebral infarction due to unspecified occlusion or stenosis of left middle cerebral artery: Secondary | ICD-10-CM | POA: Diagnosis present

## 2017-04-05 DIAGNOSIS — I63511 Cerebral infarction due to unspecified occlusion or stenosis of right middle cerebral artery: Secondary | ICD-10-CM | POA: Diagnosis not present

## 2017-04-05 DIAGNOSIS — E0800 Diabetes mellitus due to underlying condition with hyperosmolarity without nonketotic hyperglycemic-hyperosmolar coma (NKHHC): Secondary | ICD-10-CM

## 2017-04-05 DIAGNOSIS — E119 Type 2 diabetes mellitus without complications: Secondary | ICD-10-CM

## 2017-04-05 DIAGNOSIS — I10 Essential (primary) hypertension: Secondary | ICD-10-CM | POA: Diagnosis present

## 2017-04-05 LAB — GLUCOSE, CAPILLARY
GLUCOSE-CAPILLARY: 81 mg/dL (ref 65–99)
Glucose-Capillary: 92 mg/dL (ref 65–99)
Glucose-Capillary: 115 mg/dL — ABNORMAL HIGH (ref 65–99)
Glucose-Capillary: 84 mg/dL (ref 65–99)
Glucose-Capillary: 97 mg/dL (ref 65–99)

## 2017-04-05 MED ORDER — BISACODYL 10 MG RE SUPP: 10.0000 mg | Freq: Every day | RECTAL | Status: DC | PRN

## 2017-04-05 MED ORDER — PROCHLORPERAZINE 25 MG RE SUPP: 12.5000 mg | Freq: Four times a day (QID) | RECTAL | Status: DC | PRN

## 2017-04-05 MED ORDER — GLIMEPIRIDE 2 MG PO TABS
2.0000 mg | ORAL_TABLET | Freq: Every day | ORAL | Status: DC
  Administered 2017-04-06 – 2017-04-11 (×6): 2 mg via ORAL
  Filled 2017-04-05 (×6): qty 1

## 2017-04-05 MED ORDER — ALBUTEROL SULFATE (2.5 MG/3ML) 0.083% IN NEBU: 3.0000 mL | INHALATION_SOLUTION | Freq: Four times a day (QID) | RESPIRATORY_TRACT | Status: DC | PRN

## 2017-04-05 MED ORDER — PROCHLORPERAZINE EDISYLATE 5 MG/ML IJ SOLN
5.0000 mg | Freq: Four times a day (QID) | INTRAMUSCULAR | Status: DC | PRN
Start: 2017-04-05 — End: 2017-04-11

## 2017-04-05 MED ORDER — ENOXAPARIN SODIUM 40 MG/0.4ML ~~LOC~~ SOLN
40.0000 mg | SUBCUTANEOUS | Status: DC
  Administered 2017-04-05 – 2017-04-10 (×6): 40 mg via SUBCUTANEOUS
  Filled 2017-04-05 (×6): qty 0.4

## 2017-04-05 MED ORDER — DEXTROSE 5 % IV SOLN
1.0000 g | INTRAVENOUS | Status: AC
  Administered 2017-04-06 – 2017-04-07 (×2): 1 g via INTRAVENOUS
  Filled 2017-04-05 (×2): qty 10

## 2017-04-05 MED ORDER — ASPIRIN EC 81 MG PO TBEC
81.0000 mg | DELAYED_RELEASE_TABLET | Freq: Every day | ORAL | Status: DC
  Administered 2017-04-06 – 2017-04-11 (×6): 81 mg via ORAL
  Filled 2017-04-05 (×6): qty 1

## 2017-04-05 MED ORDER — LORATADINE 10 MG PO TABS
10.0000 mg | ORAL_TABLET | Freq: Every day | ORAL | Status: DC
  Administered 2017-04-06 – 2017-04-11 (×6): 10 mg via ORAL
  Filled 2017-04-05 (×6): qty 1

## 2017-04-05 MED ORDER — FLUTICASONE PROPIONATE 50 MCG/ACT NA SUSP
2.0000 | Freq: Every day | NASAL | Status: DC | PRN
Start: 2017-04-05 — End: 2017-04-11
  Filled 2017-04-05: qty 16

## 2017-04-05 MED ORDER — DIPHENHYDRAMINE HCL 12.5 MG/5ML PO ELIX: 12.5000 mg | ORAL_SOLUTION | Freq: Four times a day (QID) | ORAL | Status: DC | PRN

## 2017-04-05 MED ORDER — CEFTRIAXONE SODIUM 1 G IJ SOLR: 1.0000 g | INTRAMUSCULAR | Status: DC

## 2017-04-05 MED ORDER — DEXTROSE 5 % IV SOLN: 1.0000 g | INTRAVENOUS | 0 refills | Status: DC

## 2017-04-05 MED ORDER — GUAIFENESIN-DM 100-10 MG/5ML PO SYRP: 5.0000 mL | ORAL_SOLUTION | Freq: Four times a day (QID) | ORAL | Status: DC | PRN

## 2017-04-05 MED ORDER — FLEET ENEMA 7-19 GM/118ML RE ENEM: 1.0000 | ENEMA | Freq: Once | RECTAL | Status: DC | PRN

## 2017-04-05 MED ORDER — MECLIZINE HCL 25 MG PO TABS
12.5000 mg | ORAL_TABLET | Freq: Three times a day (TID) | ORAL | Status: DC | PRN
Start: 2017-04-05 — End: 2017-04-11

## 2017-04-05 MED ORDER — ACETAMINOPHEN 325 MG PO TABS
325.0000 mg | ORAL_TABLET | ORAL | Status: DC | PRN
  Administered 2017-04-09: 650 mg via ORAL
  Filled 2017-04-05: qty 2

## 2017-04-05 MED ORDER — CLOPIDOGREL BISULFATE 75 MG PO TABS
75.0000 mg | ORAL_TABLET | Freq: Every day | ORAL | Status: DC
  Administered 2017-04-06 – 2017-04-11 (×6): 75 mg via ORAL
  Filled 2017-04-05 (×6): qty 1

## 2017-04-05 MED ORDER — ALUM & MAG HYDROXIDE-SIMETH 200-200-20 MG/5ML PO SUSP: 30.0000 mL | ORAL | Status: DC | PRN

## 2017-04-05 MED ORDER — INSULIN ASPART 100 UNIT/ML ~~LOC~~ SOLN: SUBCUTANEOUS | 11 refills | Status: DC

## 2017-04-05 MED ORDER — PROCHLORPERAZINE MALEATE 5 MG PO TABS: 5.0000 mg | ORAL_TABLET | Freq: Four times a day (QID) | ORAL | Status: DC | PRN

## 2017-04-05 MED ORDER — POLYETHYLENE GLYCOL 3350 17 G PO PACK
17.0000 g | PACK | Freq: Every day | ORAL | Status: DC | PRN
  Administered 2017-04-07 – 2017-04-09 (×2): 17 g via ORAL
  Filled 2017-04-05 (×2): qty 1

## 2017-04-05 MED ORDER — TRAZODONE HCL 50 MG PO TABS: 25.0000 mg | ORAL_TABLET | Freq: Every evening | ORAL | Status: DC | PRN

## 2017-04-05 MED ORDER — ATORVASTATIN CALCIUM 80 MG PO TABS
80.0000 mg | ORAL_TABLET | Freq: Every day | ORAL | Status: DC
  Administered 2017-04-06 – 2017-04-11 (×6): 80 mg via ORAL
  Filled 2017-04-05 (×6): qty 1

## 2017-04-05 NOTE — Care Management Note (Signed)
Case Management Note  Patient Details  Name: [name] MRN: [id number] Date of Birth: 08/16/1941  Subjective/Objective:                    Action/Plan: Patient discharging to CIR today. No further needs per CM.  Expected Discharge Date:  04/05/17               Expected Discharge Plan:  IP Rehab Facility  In-House Referral:     Discharge planning Services     Post Acute Care Choice:    Choice offered to:     DME Arranged:    DME Agency:     HH Arranged:    HH Agency:     Status of Service:  Completed, signed off  If discussed at [employer] Length of [employer] Meetings, dates discussed:    Additional Comments:  Kermit BaloKelli F Cynthea Zachman, RN 04/05/2017, 3:12 PM

## 2017-04-05 NOTE — Care Management Note (Signed)
Case Management Note  Patient Details  Name: Allie Dimmerurna Schumacher MRN: [id number] Date of Birth: 05/30/1941  Subjective/Objective:   Pt admitted with CVA. He was recently in hospital for CVA and was [facility]                  Action/Plan: PT/OT recommending CIR. CM following for d/c disposition.  Expected Discharge Date:                  Expected Discharge Plan:  IP Rehab Facility  In-House Referral:     Discharge planning Services     Post Acute Care Choice:    Choice offered to:     DME Arranged:    DME Agency:     HH Arranged:    HH Agency:     Status of Service:  In process, will continue to follow  If discussed at Long Length of Stay Meetings, dates discussed:    Additional Comments:  Kermit BaloKelli F Icelyn Navarrete, RN 04/05/2017, 11:13 AM

## 2017-04-05 NOTE — Progress Notes (Addendum)
Pt. Minimally following commands and is not answering questions, not cooperative with assessment. Pt. Appears alert. D/W grandson and he states patient is currently not confused and has been MAEW. Pt. Ambulating with grandson as he refuses to cooperate with male staff members.

## 2017-04-05 NOTE — Progress Notes (Signed)
[facility] PROGRESS NOTE  Tanuj Mullens ZOX:[id number] DOB: 1941/06/24 DOA: 04/02/2017   PCP: Fleet Contras, MD     Assessment/Plan: Active Problems:   CVA (cerebral vascular accident) (HCC)   AKI (acute kidney injury) (HCC)   DM (diabetes mellitus) (HCC)   Elevated transaminase level   Altered mental state   Confusion   76 y.o. male with a past medical history diabetes, hypertension and hyperlipidemia who had a  right MCA watershed stroke on 7/4 for which she was admitted to [facility] and was discharged recently, was being brought back by family because the patient woke up today around 9 AM and wasn't acting normal. He refused to eat and was not speaking much as well.  Patient brought back due to recurrent symptoms/altered mental status. Neurology consulted  Assessment and plan Acute metabolic encephalopathy in the setting of UTI, recent CVA:  Right MCA scattered infarcts due to intracranial large vessel stenosis including right M1 occlusion, left MCA and bilateral PCA high-grade stenosis.   Resultant mild left arm pronator drift and dysmetria  CT head: Hyperdense left M2 branch possibly related to acute thrombosis versus atherosclerotic changes.   MRIhead: Right MCA scattered acute infarct,white matter infarcts and deep watershed pattern  MRAhead: Right M1 occlusion, Advanced intracranial atherosclerosis with moderate bilateral ICA and proximal basilar stenosis. High-grade left MCA bifurcation and bilateral P2 segment stenoses.  CarotidDoppler: Unremarkable  2D Echo: EF 55-60%. No source of embolus  LDL126  HgbA1c5.6   Patient discharged on   aspirin 81 mg daily and clopidogrel 75 mg daily. Continue DAPT for 3 months and then Plavix alone due to intracranial stenosis.  Currently awaiting CIR Patient returned 7/10  due to difficulty swallowing-speech therapy evaluation 7/11-dysphagia 1, pured diet, thin liquids MRI brain on 7/11  showed further propagation of the acute right frontoparietal MCA infarct, urology does not feel any further stroke workup is necessary. Patient will need follow-up with Dr. Roda Shutters in 6 weeks Patient also has urinary tract infection which needs to be treated   UTI Continue Rocephin Urine culture no growth so far   Acute kidney injury Discharge creatinine 0.69, presented with a creatinine of 2.04, likely secondary to dehydration, slightly improved today 1.23, continue hydration and follow renal function closely, approaching baseline. Holding ARB   Intracranial stenosis  MRA head right M1 clot occlusion, left M1 high-grade stenosis, bilateral PCA high-grade stenosis  On DAPTfor intracranial stenosis     Elevated TSH - Free T4 and T3 within normal limits as such will recommend patient have TSH reassessed within 4-6 weeks.  Hypertension - Hold Cozaar/HCTZ secondary to AKI   Hyperlipidemia - LDL 126 with goal less than 70, continue atorvastatin 80 mg by mouth daily this admission.  DM Hemoglobin A1c 5.6 Metformin/Amaryl on hold as his CBGs have been low Continue SSI   DVT prophylaxsis heparin  Code Status:  Full code    Family Communication: Discussed in detail with the patient/daughter, all imaging results, lab results explained to the patient   Disposition Plan:  CIR versus home     Consultants:  Neurology  Procedures:  None  Antibiotics: Anti-infectives    Start     Dose/Rate Route Frequency Ordered Stop   04/04/17 1100  cefTRIAXone (ROCEPHIN) 1 g in dextrose 5 % 50 mL IVPB     1 g 100 mL/hr over 30 Minutes Intravenous Every 24 hours 04/04/17 1038     04/03/17 1315  cefTRIAXone (ROCEPHIN) 1  g in dextrose 5 % 50 mL IVPB     1 g 100 mL/hr over 30 Minutes Intravenous  Once 04/03/17 1305 04/03/17 1452         HPI/Subjective: Patient does not follow commands, due to language barrier. However patient is also noted not to be cooperative with the  nursing staff and prefers male nurses   Objective: Vitals:   04/04/17 1646 04/04/17 2022 04/05/17 0029 04/05/17 0512  BP: 140/83 (!) 144/81 129/73 122/81  Pulse: 69 73 79 66  Resp: 16 18 20 18   Temp: (!) 97.2 F (36.2 C) 97.8 F (36.6 C) (!) 97.5 F (36.4 C) 98 F (36.7 C)  TempSrc: Axillary Axillary Axillary Axillary  SpO2: 98% 98% 98% 98%  Weight:      Height:        Intake/Output Summary (Last 24 hours) at 04/05/17 0919 Last data filed at 04/04/17 1646  Gross per 24 hour  Intake                0 ml  Output              300 ml  Net             -300 ml    Exam:  Examination:  General exam: awake and aware of his surroundings Respiratory system: Clear to auscultation. Respiratory effort normal. Cardiovascular system: S1 & S2 heard, RRR. No JVD, murmurs, rubs, gallops or clicks. No pedal edema. Gastrointestinal system: Abdomen is nondistended, soft and nontender. No organomegaly or masses felt. Normal bowel sounds heard. Central nervous system: Not following commands Extremities: Symmetric 5 x 5 power. Skin: No rashes, lesions or ulcers Psychiatry: Judgement and insight impaired    Data Reviewed: I have personally reviewed following labs and imaging studies  Micro Results Recent Results (from the past 240 hour(s))  Culture, blood (routine x 2)     Status: None (Preliminary result)   Collection Time: 04/02/17  2:49 PM  Result Value Ref Range Status   Specimen Description BLOOD RIGHT ANTECUBITAL  Final   Special Requests   Final    BOTTLES DRAWN AEROBIC ONLY Blood Culture adequate volume   Culture NO GROWTH 2 DAYS  Final   Report Status PENDING  Incomplete  Culture, blood (routine x 2)     Status: None (Preliminary result)   Collection Time: 04/02/17 11:26 PM  Result Value Ref Range Status   Specimen Description BLOOD RIGHT HAND  Final   Special Requests IN PEDIATRIC BOTTLE Blood Culture adequate volume  Final   Culture NO GROWTH 1 DAY  Final   Report Status  PENDING  Incomplete  Urine Culture     Status: None   Collection Time: 04/03/17  3:41 PM  Result Value Ref Range Status   Specimen Description URINE, CLEAN CATCH  Final   Special Requests NONE  Final   Culture NO GROWTH  Final   Report Status 04/04/2017 FINAL  Final    Radiology Reports Dg Chest 2 View  Result Date: 03/27/2017 CLINICAL DATA:  Dizziness and left-sided weakness. EXAM: CHEST  2 VIEW COMPARISON:  02/21/2012 FINDINGS: Lungs are adequately inflated without focal consolidation or effusion. Patient slightly rotated to the left. Cardiomediastinal silhouette is within normal. Subtle calcified plaque over the aortic arch. Mild degenerate change of the spine. IMPRESSION: No acute cardiopulmonary disease. Minimal aortic atherosclerosis. Electronically Signed   By: Elberta Fortisaniel  Boyle M.D.   On: 03/27/2017 07:50   Ct Head Wo Contrast  Result  Date: 04/02/2017 CLINICAL DATA:  76 y/o  M; altered mental status. EXAM: CT HEAD WITHOUT CONTRAST TECHNIQUE: Contiguous axial images were obtained from the base of the skull through the vertex without intravenous contrast. COMPARISON:  03/27/2017 MRI head.  03/26/2017 CT head. FINDINGS: Brain: No evidence of acute infarction, hemorrhage, hydrocephalus, extra-axial collection or mass lesion/mass effect. Moderate chronic microvascular ischemic changes of white matter parenchymal volume loss of the brain. Lucencies in the right MCA watershed distribution corresponding to infarcts on prior MRI noted. Vascular: Calcific atherosclerosis of the carotid siphons. Skull: Normal. Negative for fracture or focal lesion. Sinuses/Orbits: 9 mm left anterior ethmoid osteoma. Visualized paranasal sinuses are otherwise clear. Orbits are unremarkable. Other: None. IMPRESSION: 1. No acute intracranial abnormality identified. 2. Stable moderate chronic microvascular ischemic changes and moderate parenchymal volume loss of the brain. 3. Small lucencies corresponding to right MCA  distribution foci of infarction on prior MRI. Electronically Signed   By: Mitzi Hansen M.D.   On: 04/02/2017 15:23   Ct Head Wo Contrast  Result Date: 03/26/2017 CLINICAL DATA:  76 year old hypertensive male with dizziness since last night. Bilateral arm and leg numbness. Facial droop this morning. EXAM: CT HEAD WITHOUT CONTRAST TECHNIQUE: Contiguous axial images were obtained from the base of the skull through the vertex without intravenous contrast. COMPARISON:  None. FINDINGS: Brain: No intracranial hemorrhage. Slightly hyperdense left middle cerebral artery M2 branch possibly related to acute thrombosis versus atherosclerotic changes. No other CT evidence of large acute infarct. Prominent chronic microvascular changes. This limits evaluation for detection of small or moderate-size infarct. Global atrophy. No intracranial mass lesion noted on this unenhanced exam. Vascular: Vascular calcifications in addition to above findings. Skull: No acute abnormality. Sinuses/Orbits: No acute orbital abnormality. Minimal mucosal thickening right maxillary sinus and ethmoid sinus air cells. Other: Mastoid air cells and middle ear cavities are clear. IMPRESSION: No intracranial hemorrhage. Slightly hyperdense left middle cerebral artery M2 branch possibly related to acute thrombosis versus atherosclerotic changes. No other CT evidence of large acute infarct. Prominent chronic microvascular changes. This limits evaluation for detection of small or moderate-size infarct. Global atrophy. Electronically Signed   By: Lacy Duverney M.D.   On: 03/26/2017 19:07   Mr Brain Wo Contrast  Result Date: 04/02/2017 CLINICAL DATA:  Altered mental status, now nonverbal. History of stroke and LEFT-sided deficits. History of M1 occlusion, hypertension and diabetes. EXAM: MRI HEAD WITHOUT CONTRAST TECHNIQUE: Single axial diffusion weighted sequence. Patient was unable to tolerate further imaging in attempted to extract cell  from scanner. COMPARISON:  CT HEAD April 02, 2017 and MRI of the head March 27, 2017 FINDINGS: Brain: Patchy reduced diffusion RIGHT frontoparietal lobes, further propagation from prior examination with low ADC values. No midline shift, or mass effect. No hydrocephalus. No abnormal extra-axial fluid collections. Vascular: Not assessed Skull and upper cervical spine: Not assessed Sinuses/Orbits: Not assessed Other: Not applicable IMPRESSION: Limited single sequence MRI of the head: Further propagation of acute RIGHT frontoparietal/MCA territory infarct. Electronically Signed   By: Awilda Metro M.D.   On: 04/02/2017 21:48   Mr Brain Wo Contrast (neuro Protocol)  Result Date: 03/27/2017 CLINICAL DATA:  Left-sided weakness. EXAM: MRI HEAD WITHOUT CONTRAST MRA HEAD WITHOUT CONTRAST TECHNIQUE: Multiplanar, multiecho pulse sequences of the brain and surrounding structures were obtained without intravenous contrast. Angiographic images of the head were obtained using MRA technique without contrast. COMPARISON:  Head CT from yesterday FINDINGS: MRI HEAD FINDINGS Brain: Moderate degree of patchy acute infarct in the right cerebral  white matter, roughly aligned along the ventricular margin, MCA deep watershed pattern. At least 1 tiny cortical infarct is present along the frontal operculum. No separate distribution infarct noted. No hemorrhagic conversion. There is a background of extensive chronic microvascular ischemic gliosis in the cerebral white matter. No hydrocephalus or masslike findings. On SWI, asymmetric prominence of right sided sulcal vessels is likely from slow flow and deoxygenation of blood in the setting of proximal MCA occlusion and presumed extensive pial pial collaterals. Patient is at least 3 days post ictus. Vascular: Abnormal right M1 segment flow void, see below. Skull and upper cervical spine: Negative for marrow lesion. Sinuses/Orbits: No acute finding MRA HEAD FINDINGS Comparatively small right  ICA with less intense enhancement, with moderate stenosis at the anterior genu cavernous segment. Right M1 cut off. There is moderate narrowing of the proximal right A1 segment. Left A3 segment moderate to advanced stenosis. The left supraclinoid ICA is moderately narrowed. There is early branching left MCA with high-grade stenosis at the bifurcation. Left dominant vertebral artery. Proximal basilar stenosis due to shelf-like plaque, moderate narrowing. There is high-grade narrowing of bilateral P2 segments, worse on the left. Negative for aneurysm. Case discussed with Dr. Amada Jupiter at time of interpretation. IMPRESSION: 1. Right M1 occlusion with comparatively mild downstream white matter infarcts, deep watershed pattern. 2. Advanced intracranial atherosclerosis with moderate bilateral ICA and proximal basilar stenosis. High-grade left MCA bifurcation and bilateral P2 segment stenoses. 3. Advanced chronic small vessel ischemia in the cerebral white matter. Electronically Signed   By: Marnee Spring M.D.   On: 03/27/2017 19:04   Dg Chest Portable 1 View  Result Date: 04/02/2017 CLINICAL DATA:  76 year old male with a history of altered mental status EXAM: PORTABLE CHEST 1 VIEW COMPARISON:  03/27/2017 FINDINGS: Cardiomediastinal silhouette unchanged in size and contour. No pneumothorax or pleural effusion. No confluent airspace disease. Coarsened interstitial markings similar to prior. No displaced fracture IMPRESSION: No radiographic evidence of acute cardiopulmonary disease Electronically Signed   By: Gilmer Mor D.O.   On: 04/02/2017 15:31   Mr Maxine Glenn Head/brain ZO Cm  Result Date: 03/27/2017 CLINICAL DATA:  Left-sided weakness. EXAM: MRI HEAD WITHOUT CONTRAST MRA HEAD WITHOUT CONTRAST TECHNIQUE: Multiplanar, multiecho pulse sequences of the brain and surrounding structures were obtained without intravenous contrast. Angiographic images of the head were obtained using MRA technique without contrast.  COMPARISON:  Head CT from yesterday FINDINGS: MRI HEAD FINDINGS Brain: Moderate degree of patchy acute infarct in the right cerebral white matter, roughly aligned along the ventricular margin, MCA deep watershed pattern. At least 1 tiny cortical infarct is present along the frontal operculum. No separate distribution infarct noted. No hemorrhagic conversion. There is a background of extensive chronic microvascular ischemic gliosis in the cerebral white matter. No hydrocephalus or masslike findings. On SWI, asymmetric prominence of right sided sulcal vessels is likely from slow flow and deoxygenation of blood in the setting of proximal MCA occlusion and presumed extensive pial pial collaterals. Patient is at least 3 days post ictus. Vascular: Abnormal right M1 segment flow void, see below. Skull and upper cervical spine: Negative for marrow lesion. Sinuses/Orbits: No acute finding MRA HEAD FINDINGS Comparatively small right ICA with less intense enhancement, with moderate stenosis at the anterior genu cavernous segment. Right M1 cut off. There is moderate narrowing of the proximal right A1 segment. Left A3 segment moderate to advanced stenosis. The left supraclinoid ICA is moderately narrowed. There is early branching left MCA with high-grade stenosis at the bifurcation. Left  dominant vertebral artery. Proximal basilar stenosis due to shelf-like plaque, moderate narrowing. There is high-grade narrowing of bilateral P2 segments, worse on the left. Negative for aneurysm. Case discussed with Dr. Amada Jupiter at time of interpretation. IMPRESSION: 1. Right M1 occlusion with comparatively mild downstream white matter infarcts, deep watershed pattern. 2. Advanced intracranial atherosclerosis with moderate bilateral ICA and proximal basilar stenosis. High-grade left MCA bifurcation and bilateral P2 segment stenoses. 3. Advanced chronic small vessel ischemia in the cerebral white matter. Electronically Signed   By: Marnee Spring M.D.   On: 03/27/2017 19:04     CBC  Recent Labs Lab 04/02/17 1548 04/02/17 1716 04/04/17 0516  WBC 9.8  --  6.4  HGB 19.5* 18.0* 14.8  HCT 55.0* 53.0* 42.2  PLT 205  --  161  MCV 95.2  --  93.8  MCH 33.7  --  32.9  MCHC 35.5  --  35.1  RDW 13.1  --  12.8  LYMPHSABS 1.7  --   --   MONOABS 1.1*  --   --   EOSABS 0.1  --   --   BASOSABS 0.1  --   --     Chemistries   Recent Labs Lab 04/02/17 1548 04/02/17 1716 04/04/17 0516  NA 139 140 141  K 4.9 4.5 3.6  CL 102 106 111  CO2 19*  --  18*  GLUCOSE 94 84 79  BUN 34* 37* 22*  CREATININE 2.04* 1.60* 1.23  CALCIUM 10.3  --  8.8*  AST 97*  --  76*  ALT 82*  --  67*  ALKPHOS 69  --  49  BILITOT 2.9*  --  1.7*   ------------------------------------------------------------------------------------------------------------------ estimated creatinine clearance is 47.8 mL/min (by C-G formula based on SCr of 1.23 mg/dL). ------------------------------------------------------------------------------------------------------------------ No results for input(s): HGBA1C in the last 72 hours. ------------------------------------------------------------------------------------------------------------------ No results for input(s): CHOL, HDL, LDLCALC, TRIG, CHOLHDL, LDLDIRECT in the last 72 hours. ------------------------------------------------------------------------------------------------------------------ No results for input(s): TSH, T4TOTAL, T3FREE, THYROIDAB in the last 72 hours.  Invalid input(s): FREET3 ------------------------------------------------------------------------------------------------------------------ No results for input(s): VITAMINB12, FOLATE, FERRITIN, TIBC, IRON, RETICCTPCT in the last 72 hours.  Coagulation profile  Recent Labs Lab 04/02/17 1548  INR 1.03    No results for input(s): DDIMER in the last 72 hours.  Cardiac Enzymes No results for input(s): CKMB, TROPONINI, MYOGLOBIN in the  last 168 hours.  Invalid input(s): CK ------------------------------------------------------------------------------------------------------------------ Invalid input(s): POCBNP   CBG:  Recent Labs Lab 04/04/17 1638 04/04/17 1913 04/04/17 2322 04/05/17 0322 04/05/17 0755  GLUCAP 97 87 84 81 92       Studies: No results found.    Lab Results  Component Value Date   HGBA1C 5.6 03/27/2017   Lab Results  Component Value Date   MICROALBUR 0.50 08/28/2010   LDLCALC 126 (H) 03/27/2017   CREATININE 1.23 04/04/2017       Scheduled Meds: . aspirin  81 mg Oral Once  . aspirin EC  81 mg Oral Daily  . atorvastatin  80 mg Oral Daily  . clopidogrel  75 mg Oral Daily  . cyanocobalamin  1,000 mcg Intramuscular Daily  . heparin  5,000 Units Subcutaneous Q8H  . insulin aspart  0-9 Units Subcutaneous Q4H   Continuous Infusions: . sodium chloride 100 mL/hr at 04/04/17 0400  . cefTRIAXone (ROCEPHIN)  IV Stopped (04/04/17 1157)     LOS: 3 days    Time spent: >30 MINS    Richarda Overlie  [facility] Pager [phone]. If 7PM-7AM,  please contact night-coverage at [email or web address], password [facility] 04/05/2017, 9:19 AM  LOS: 3 days

## 2017-04-05 NOTE — Progress Notes (Signed)
Physical Therapy Treatment Patient Details Name: [name] MRN: [id number] DOB: 06/26/1941 Today's Date: 04/05/2017    History of Present Illness Pt is a 76 y/o male admitted secondary to difficulty with speech and eating. 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 Of note, pt was recently hospitalized from 7/2-7/5 with an acute R MCA CVA. PMH including but not limited to DM and HTN.    PT Comments    Pt still not taking direction well with tactile or verbal cues.  Emphasis on gait with RW, w/shift, equal steps on the L LE and safe use of the RW.   Follow Up Recommendations  CIR;Supervision/Assistance - 24 hour     Equipment Recommendations  None recommended by PT    Recommendations for Other Services Rehab consult     Precautions / Restrictions Precautions Precautions: Fall Precaution Comments: left inattention    Mobility  Bed Mobility Overal bed mobility: Needs Assistance Bed Mobility: Supine to Sit     Supine to sit: Min guard     General bed mobility comments: pt comes to EOB, but inattentive to L UE and does not accept L UE facilitation well.  Transfers Overall transfer level: Needs assistance Equipment used: Rolling walker (2 wheeled);Straight cane;None Transfers: Sit to/from UGI CorporationStand;Stand Pivot Transfers Sit to Stand: Min guard;Min assist Stand pivot transfers: Min guard;Min assist       General transfer comment: cues for direction , stability assist during stepping.  Ambulation/Gait Ambulation/Gait assistance: Mod assist;Min assist Ambulation Distance (Feet): 50 Feet (x2) Assistive device: Rolling walker (2 wheeled) Gait Pattern/deviations: Step-to pattern;Decreased step length - right;Decreased step length - left;Decreased stance time - left Gait velocity: decreased Gait velocity interpretation: Below normal speed for age/gender General Gait Details: pt's gait is mostly hemiparetic in nature.  pt frequently drags L foot  behind or does not advance L LE fully.  pt does not accept alot of physical cueing from therapist verbally or tactilly or from family.  emphasized w/shift and hold R to advance L LE today.   Stairs            Wheelchair Mobility    Modified Rankin (Stroke Patients Only) Modified Rankin (Stroke Patients Only) Pre-Morbid Rankin Score: Moderately severe disability Modified Rankin: Moderately severe disability     Balance Overall balance assessment: Needs assistance Sitting-balance support: Feet supported;No upper extremity supported Sitting balance-Leahy Scale: Good Sitting balance - Comments: sitting EOB   Standing balance support: Bilateral upper extremity supported;Single extremity supported Standing balance-Leahy Scale: Poor Standing balance comment: needs min assist for stability                            Cognition Arousal/Alertness: Awake/alert Behavior During Therapy: Flat affect Overall Cognitive Status: Difficult to assess Area of Impairment: Following commands                       Following Commands: Follows one step commands with increased time       General Comments: pt either resents taking direction or doesn't see (understand) the importance of following the directions.  It is difficult to discern and daughter not giving insight.      Exercises      General Comments        Pertinent Vitals/Pain Pain Assessment: Faces Faces Pain Scale: No hurt    Home Living  Prior Function            PT Goals (current goals can now be found in the care plan section) Acute Rehab PT Goals Patient Stated Goal: to get Dad more independent PT Goal Formulation: Patient unable to participate in goal setting Time For Goal Achievement: 04/17/17 Potential to Achieve Goals: Good Progress towards PT goals: Progressing toward goals    Frequency    Min 4X/week      PT Plan Current plan remains appropriate     Co-evaluation              AM-PAC PT "6 Clicks" Daily Activity  Outcome Measure  Difficulty turning over in bed (including adjusting bedclothes, sheets and blankets)?: A Little Difficulty moving from lying on back to sitting on the side of the bed? : A Little Difficulty sitting down on and standing up from a chair with arms (e.g., wheelchair, bedside commode, etc,.)?: A Little Help needed moving to and from a bed to chair (including a wheelchair)?: A Little Help needed walking in hospital room?: A Lot Help needed climbing 3-5 steps with a railing? : A Lot 6 Click Score: 16    End of Session   Activity Tolerance: Patient tolerated treatment well Patient left: in chair;with call bell/phone within reach;with family/visitor present Nurse Communication: Mobility status PT Visit Diagnosis: Unsteadiness on feet (R26.81);Other abnormalities of gait and mobility (R26.89);Other symptoms and signs involving the nervous system (R29.898)     Time: 1610-9604 PT Time Calculation (min) (ACUTE ONLY): 21 min  Charges:  $Gait Training: 8-22 mins                    G Codes:       05-05-17  [place] Bing, PT [phone]  (pager)   [name] 05/05/2017, 5:42 PM

## 2017-04-05 NOTE — Discharge Summary (Signed)
Physician Discharge Summary  [name] MRN: [id number] DOB/AGE: [id number] y.o.  PCP: Nolene Ebbs, MD   Admit date: 04/02/2017 Discharge date: 04/05/2017  Discharge Diagnoses:    Active Problems:   CVA (cerebral vascular accident) ([place])   AKI (acute kidney injury) ([place])   DM (diabetes mellitus) ([place])   Elevated transaminase level   Altered mental state   Confusion    Follow-up recommendations Follow-up with PCP in 3-5 days , including all  additional recommended appointments as below Follow-up CBC, CMP in 3-5 days Continue Rocephin for another 3 days then discontinue Resume antihypertensive medications if  renal function remains stable over the course of one to 2 weeks      Current Discharge Medication List    START taking these medications   Details  cefTRIAXone 1 g in dextrose 5 % 50 mL Inject 1 g into the vein daily. Qty: 3 ampule, Refills: 0    insulin aspart (NOVOLOG) 100 UNIT/ML injection Correction coverage: Sensitive (thin, NPO, renal)  CBG < 70: implement hypoglycemia protocol  CBG 70 - 120: 0 units  CBG 121 - 150: 1 unit  CBG 151 - 200: 2 units  CBG 201 - 250: 3 units  CBG 251 - 300: 5 units  CBG 301 - 350: 7 units  CBG 351 - 400 9 units  CBG > 400 call MD and obtain STAT lab verification Qty: 10 mL, Refills: 11      CONTINUE these medications which have NOT CHANGED   Details  albuterol (PROVENTIL HFA;VENTOLIN HFA) 108 (90 Base) MCG/ACT inhaler Inhale 2 puffs into the lungs every 6 (six) hours as needed for wheezing or shortness of breath.    aspirin EC 81 MG tablet Take 81 mg by mouth daily.    atorvastatin (LIPITOR) 80 MG tablet Take 1 tablet (80 mg total) by mouth daily at 6 PM. Qty: 30 tablet, Refills: 0    clopidogrel (PLAVIX) 75 MG tablet Take 1 tablet (75 mg total) by mouth daily. Qty: 30 tablet, Refills: 0    fluticasone (FLONASE) 50 MCG/ACT nasal spray Place 2 sprays into both nostrils daily as needed for allergies. Refills: 3     glimepiride (AMARYL) 4 MG tablet Take 4 mg by mouth daily with breakfast.    loratadine (CLARITIN) 10 MG tablet Take 10 mg by mouth daily.     meclizine (ANTIVERT) 12.5 MG tablet Take 12.5 mg by mouth 3 (three) times daily as needed for dizziness.      STOP taking these medications     lisinopril (PRINIVIL,ZESTRIL) 40 MG tablet      losartan-hydrochlorothiazide (HYZAAR) 100-12.5 MG tablet      metFORMIN (GLUCOPHAGE) 500 MG tablet          Discharge Condition:  Stable   Discharge Instructions Get Medicines reviewed and adjusted: Please take all your medications with you for your next visit with your Primary MD  Please request your Primary MD to go over all hospital tests and procedure/radiological results at the follow up, please ask your Primary MD to get all Hospital records sent to his/her office.  If you experience worsening of your admission symptoms, develop shortness of breath, life threatening emergency, suicidal or homicidal thoughts you must seek medical attention immediately by calling 911 or calling your MD immediately if symptoms less severe.  You must read complete instructions/literature along with all the possible adverse reactions/side effects for all the Medicines you take and that have been prescribed to you. Take any new  Medicines after you have completely understood and accpet all the possible adverse reactions/side effects.   Do not drive when taking Pain medications.   Do not take more than prescribed Pain, Sleep and Anxiety Medications  Special Instructions: If you have smoked or chewed Tobacco in the last 2 yrs please stop smoking, stop any regular Alcohol and or any Recreational drug use.  Wear Seat belts while driving.  Please note  You were cared for by a hospitalist during your hospital stay. Once you are discharged, your primary care physician will handle any further medical issues. Please note that NO REFILLS for any discharge  medications will be authorized once you are discharged, as it is imperative that you return to your primary care physician (or establish a relationship with a primary care physician if you do not have one) for your aftercare needs so that they can reassess your need for medications and monitor your lab values.  Discharge Instructions    Diet - low sodium heart healthy    Complete by:  As directed    Increase activity slowly    Complete by:  As directed        No Known Allergies    Disposition: CIR   Consults:  Neurology Inpatient rehabilitation     Significant Diagnostic Studies:  Dg Chest 2 View  Result Date: 03/27/2017 CLINICAL DATA:  Dizziness and left-sided weakness. EXAM: CHEST  2 VIEW COMPARISON:  02/21/2012 FINDINGS: Lungs are adequately inflated without focal consolidation or effusion. Patient slightly rotated to the left. Cardiomediastinal silhouette is within normal. Subtle calcified plaque over the aortic arch. Mild degenerate change of the spine. IMPRESSION: No acute cardiopulmonary disease. Minimal aortic atherosclerosis. Electronically Signed   By: Marin Olp M.D.   On: 03/27/2017 07:50   Ct Head Wo Contrast  Result Date: 04/02/2017 CLINICAL DATA:  76 y/o  M; altered mental status. EXAM: CT HEAD WITHOUT CONTRAST TECHNIQUE: Contiguous axial images were obtained from the base of the skull through the vertex without intravenous contrast. COMPARISON:  03/27/2017 MRI head.  03/26/2017 CT head. FINDINGS: Brain: No evidence of acute infarction, hemorrhage, hydrocephalus, extra-axial collection or mass lesion/mass effect. Moderate chronic microvascular ischemic changes of white matter parenchymal volume loss of the brain. Lucencies in the right MCA watershed distribution corresponding to infarcts on prior MRI noted. Vascular: Calcific atherosclerosis of the carotid siphons. Skull: Normal. Negative for fracture or focal lesion. Sinuses/Orbits: 9 mm left anterior ethmoid  osteoma. Visualized paranasal sinuses are otherwise clear. Orbits are unremarkable. Other: None. IMPRESSION: 1. No acute intracranial abnormality identified. 2. Stable moderate chronic microvascular ischemic changes and moderate parenchymal volume loss of the brain. 3. Small lucencies corresponding to right MCA distribution foci of infarction on prior MRI. Electronically Signed   By: Kristine Garbe M.D.   On: 04/02/2017 15:23   Ct Head Wo Contrast  Result Date: 03/26/2017 CLINICAL DATA:  76 year old hypertensive male with dizziness since last night. Bilateral arm and leg numbness. Facial droop this morning. EXAM: CT HEAD WITHOUT CONTRAST TECHNIQUE: Contiguous axial images were obtained from the base of the skull through the vertex without intravenous contrast. COMPARISON:  None. FINDINGS: Brain: No intracranial hemorrhage. Slightly hyperdense left middle cerebral artery M2 branch possibly related to acute thrombosis versus atherosclerotic changes. No other CT evidence of large acute infarct. Prominent chronic microvascular changes. This limits evaluation for detection of small or moderate-size infarct. Global atrophy. No intracranial mass lesion noted on this unenhanced exam. Vascular: Vascular calcifications in addition to above  findings. Skull: No acute abnormality. Sinuses/Orbits: No acute orbital abnormality. Minimal mucosal thickening right maxillary sinus and ethmoid sinus air cells. Other: Mastoid air cells and middle ear cavities are clear. IMPRESSION: No intracranial hemorrhage. Slightly hyperdense left middle cerebral artery M2 branch possibly related to acute thrombosis versus atherosclerotic changes. No other CT evidence of large acute infarct. Prominent chronic microvascular changes. This limits evaluation for detection of small or moderate-size infarct. Global atrophy. Electronically Signed   By: Genia Del M.D.   On: 03/26/2017 19:07   Mr Brain Wo Contrast  Result Date:  04/02/2017 CLINICAL DATA:  Altered mental status, now nonverbal. History of stroke and LEFT-sided deficits. History of M1 occlusion, hypertension and diabetes. EXAM: MRI HEAD WITHOUT CONTRAST TECHNIQUE: Single axial diffusion weighted sequence. Patient was unable to tolerate further imaging in attempted to extract cell from scanner. COMPARISON:  CT HEAD April 02, 2017 and MRI of the head March 27, 2017 FINDINGS: Brain: Patchy reduced diffusion RIGHT frontoparietal lobes, further propagation from prior examination with low ADC values. No midline shift, or mass effect. No hydrocephalus. No abnormal extra-axial fluid collections. Vascular: Not assessed Skull and upper cervical spine: Not assessed Sinuses/Orbits: Not assessed Other: Not applicable IMPRESSION: Limited single sequence MRI of the head: Further propagation of acute RIGHT frontoparietal/MCA territory infarct. Electronically Signed   By: Elon Alas M.D.   On: 04/02/2017 21:48   Mr Brain Wo Contrast (neuro Protocol)  Result Date: 03/27/2017 CLINICAL DATA:  Left-sided weakness. EXAM: MRI HEAD WITHOUT CONTRAST MRA HEAD WITHOUT CONTRAST TECHNIQUE: Multiplanar, multiecho pulse sequences of the brain and surrounding structures were obtained without intravenous contrast. Angiographic images of the head were obtained using MRA technique without contrast. COMPARISON:  Head CT from yesterday FINDINGS: MRI HEAD FINDINGS Brain: Moderate degree of patchy acute infarct in the right cerebral white matter, roughly aligned along the ventricular margin, MCA deep watershed pattern. At least 1 tiny cortical infarct is present along the frontal operculum. No separate distribution infarct noted. No hemorrhagic conversion. There is a background of extensive chronic microvascular ischemic gliosis in the cerebral white matter. No hydrocephalus or masslike findings. On SWI, asymmetric prominence of right sided sulcal vessels is likely from slow flow and deoxygenation of blood  in the setting of proximal MCA occlusion and presumed extensive pial pial collaterals. Patient is at least 3 days post ictus. Vascular: Abnormal right M1 segment flow void, see below. Skull and upper cervical spine: Negative for marrow lesion. Sinuses/Orbits: No acute finding MRA HEAD FINDINGS Comparatively small right ICA with less intense enhancement, with moderate stenosis at the anterior genu cavernous segment. Right M1 cut off. There is moderate narrowing of the proximal right A1 segment. Left A3 segment moderate to advanced stenosis. The left supraclinoid ICA is moderately narrowed. There is early branching left MCA with high-grade stenosis at the bifurcation. Left dominant vertebral artery. Proximal basilar stenosis due to shelf-like plaque, moderate narrowing. There is high-grade narrowing of bilateral P2 segments, worse on the left. Negative for aneurysm. Case discussed with Dr. Leonel Ramsay at time of interpretation. IMPRESSION: 1. Right M1 occlusion with comparatively mild downstream white matter infarcts, deep watershed pattern. 2. Advanced intracranial atherosclerosis with moderate bilateral ICA and proximal basilar stenosis. High-grade left MCA bifurcation and bilateral P2 segment stenoses. 3. Advanced chronic small vessel ischemia in the cerebral white matter. Electronically Signed   By: Monte Fantasia M.D.   On: 03/27/2017 19:04   Dg Chest Portable 1 View  Result Date: 04/02/2017 CLINICAL DATA:  76 year old male  with a history of altered mental status EXAM: PORTABLE CHEST 1 VIEW COMPARISON:  03/27/2017 FINDINGS: Cardiomediastinal silhouette unchanged in size and contour. No pneumothorax or pleural effusion. No confluent airspace disease. Coarsened interstitial markings similar to prior. No displaced fracture IMPRESSION: No radiographic evidence of acute cardiopulmonary disease Electronically Signed   By: Corrie Mckusick D.O.   On: 04/02/2017 15:31   Mr Jodene Nam Head/brain PP Cm  Result Date:  03/27/2017 CLINICAL DATA:  Left-sided weakness. EXAM: MRI HEAD WITHOUT CONTRAST MRA HEAD WITHOUT CONTRAST TECHNIQUE: Multiplanar, multiecho pulse sequences of the brain and surrounding structures were obtained without intravenous contrast. Angiographic images of the head were obtained using MRA technique without contrast. COMPARISON:  Head CT from yesterday FINDINGS: MRI HEAD FINDINGS Brain: Moderate degree of patchy acute infarct in the right cerebral white matter, roughly aligned along the ventricular margin, MCA deep watershed pattern. At least 1 tiny cortical infarct is present along the frontal operculum. No separate distribution infarct noted. No hemorrhagic conversion. There is a background of extensive chronic microvascular ischemic gliosis in the cerebral white matter. No hydrocephalus or masslike findings. On SWI, asymmetric prominence of right sided sulcal vessels is likely from slow flow and deoxygenation of blood in the setting of proximal MCA occlusion and presumed extensive pial pial collaterals. Patient is at least 3 days post ictus. Vascular: Abnormal right M1 segment flow void, see below. Skull and upper cervical spine: Negative for marrow lesion. Sinuses/Orbits: No acute finding MRA HEAD FINDINGS Comparatively small right ICA with less intense enhancement, with moderate stenosis at the anterior genu cavernous segment. Right M1 cut off. There is moderate narrowing of the proximal right A1 segment. Left A3 segment moderate to advanced stenosis. The left supraclinoid ICA is moderately narrowed. There is early branching left MCA with high-grade stenosis at the bifurcation. Left dominant vertebral artery. Proximal basilar stenosis due to shelf-like plaque, moderate narrowing. There is high-grade narrowing of bilateral P2 segments, worse on the left. Negative for aneurysm. Case discussed with Dr. Leonel Ramsay at time of interpretation. IMPRESSION: 1. Right M1 occlusion with comparatively mild downstream  white matter infarcts, deep watershed pattern. 2. Advanced intracranial atherosclerosis with moderate bilateral ICA and proximal basilar stenosis. High-grade left MCA bifurcation and bilateral P2 segment stenoses. 3. Advanced chronic small vessel ischemia in the cerebral white matter. Electronically Signed   By: Monte Fantasia M.D.   On: 03/27/2017 19:04       Filed Weights   04/02/17 1415 04/02/17 2200  Weight: 65.3 kg (144 lb) 65.1 kg (143 lb 8.3 oz)     Microbiology: Recent Results (from the past 240 hour(s))  Culture, blood (routine x 2)     Status: None (Preliminary result)   Collection Time: 04/02/17  2:49 PM  Result Value Ref Range Status   Specimen Description BLOOD RIGHT ANTECUBITAL  Final   Special Requests   Final    BOTTLES DRAWN AEROBIC ONLY Blood Culture adequate volume   Culture NO GROWTH 3 DAYS  Final   Report Status PENDING  Incomplete  Culture, blood (routine x 2)     Status: None (Preliminary result)   Collection Time: 04/02/17 11:26 PM  Result Value Ref Range Status   Specimen Description BLOOD RIGHT HAND  Final   Special Requests IN PEDIATRIC BOTTLE Blood Culture adequate volume  Final   Culture NO GROWTH 2 DAYS  Final   Report Status PENDING  Incomplete  Urine Culture     Status: None   Collection Time: 04/03/17  3:41  PM  Result Value Ref Range Status   Specimen Description URINE, CLEAN CATCH  Final   Special Requests NONE  Final   Culture NO GROWTH  Final   Report Status 04/04/2017 FINAL  Final       Blood Culture    Component Value Date/Time   SDES URINE, CLEAN CATCH 04/03/2017 1541   SPECREQUEST NONE 04/03/2017 1541   CULT NO GROWTH 04/03/2017 1541   REPTSTATUS 04/04/2017 FINAL 04/03/2017 1541      Labs: Results for orders placed or performed during the hospital encounter of 04/02/17 (from the past 48 hour(s))  Urine Culture     Status: None   Collection Time: 04/03/17  3:41 PM  Result Value Ref Range   Specimen Description URINE, CLEAN  CATCH    Special Requests NONE    Culture NO GROWTH    Report Status 04/04/2017 FINAL   Glucose, capillary     Status: None   Collection Time: 04/03/17  4:50 PM  Result Value Ref Range   Glucose-Capillary 81 65 - 99 mg/dL   Comment 1 Notify RN    Comment 2 Document in Chart   Glucose, capillary     Status: Abnormal   Collection Time: 04/03/17  7:14 PM  Result Value Ref Range   Glucose-Capillary 103 (H) 65 - 99 mg/dL   Comment 1 Notify RN    Comment 2 Document in Chart   Glucose, capillary     Status: None   Collection Time: 04/03/17 11:22 PM  Result Value Ref Range   Glucose-Capillary 72 65 - 99 mg/dL   Comment 1 Notify RN    Comment 2 Document in Chart   Glucose, capillary     Status: None   Collection Time: 04/04/17  3:31 AM  Result Value Ref Range   Glucose-Capillary 99 65 - 99 mg/dL   Comment 1 Notify RN    Comment 2 Document in Chart   Comprehensive metabolic panel     Status: Abnormal   Collection Time: 04/04/17  5:16 AM  Result Value Ref Range   Sodium 141 135 - 145 mmol/L   Potassium 3.6 3.5 - 5.1 mmol/L    Comment: DELTA CHECK NOTED   Chloride 111 101 - 111 mmol/L   CO2 18 (L) 22 - 32 mmol/L   Glucose, Bld 79 65 - 99 mg/dL   BUN 22 (H) 6 - 20 mg/dL   Creatinine, Ser 1.23 0.61 - 1.24 mg/dL   Calcium 8.8 (L) 8.9 - 10.3 mg/dL   Total Protein 6.9 6.5 - 8.1 g/dL   Albumin 3.4 (L) 3.5 - 5.0 g/dL   AST 76 (H) 15 - 41 U/L   ALT 67 (H) 17 - 63 U/L   Alkaline Phosphatase 49 38 - 126 U/L   Total Bilirubin 1.7 (H) 0.3 - 1.2 mg/dL   GFR calc non Af Amer 56 (L) >60 mL/min   GFR calc Af Amer >60 >60 mL/min    Comment: (NOTE) The eGFR has been calculated using the CKD EPI equation. This calculation has not been validated in all clinical situations. eGFR's persistently <60 mL/min signify possible Chronic Kidney Disease.    Anion gap 12 5 - 15  CBC     Status: None   Collection Time: 04/04/17  5:16 AM  Result Value Ref Range   WBC 6.4 4.0 - 10.5 K/uL   RBC 4.50 4.22  - 5.81 MIL/uL   Hemoglobin 14.8 13.0 - 17.0 g/dL   HCT 42.2  39.0 - 52.0 %   MCV 93.8 78.0 - 100.0 fL   MCH 32.9 26.0 - 34.0 pg   MCHC 35.1 30.0 - 36.0 g/dL   RDW 12.8 11.5 - 15.5 %   Platelets 161 150 - 400 K/uL  Glucose, capillary     Status: None   Collection Time: 04/04/17  8:09 AM  Result Value Ref Range   Glucose-Capillary 80 65 - 99 mg/dL  Glucose, capillary     Status: Abnormal   Collection Time: 04/04/17 11:30 AM  Result Value Ref Range   Glucose-Capillary 102 (H) 65 - 99 mg/dL   Comment 1 Notify RN    Comment 2 Document in Chart   Glucose, capillary     Status: None   Collection Time: 04/04/17  4:38 PM  Result Value Ref Range   Glucose-Capillary 97 65 - 99 mg/dL  Glucose, capillary     Status: None   Collection Time: 04/04/17  7:13 PM  Result Value Ref Range   Glucose-Capillary 87 65 - 99 mg/dL   Comment 1 Notify RN    Comment 2 Document in Chart   Glucose, capillary     Status: None   Collection Time: 04/04/17 11:22 PM  Result Value Ref Range   Glucose-Capillary 84 65 - 99 mg/dL   Comment 1 Notify RN    Comment 2 Document in Chart   Glucose, capillary     Status: None   Collection Time: 04/05/17  3:22 AM  Result Value Ref Range   Glucose-Capillary 81 65 - 99 mg/dL   Comment 1 Notify RN    Comment 2 Document in Chart   Glucose, capillary     Status: None   Collection Time: 04/05/17  7:55 AM  Result Value Ref Range   Glucose-Capillary 92 65 - 99 mg/dL  Glucose, capillary     Status: Abnormal   Collection Time: 04/05/17 11:52 AM  Result Value Ref Range   Glucose-Capillary 115 (H) 65 - 99 mg/dL     Lipid Panel     Component Value Date/Time   CHOL 187 03/27/2017 0355   TRIG 97 03/27/2017 0355   HDL 42 03/27/2017 0355   CHOLHDL 4.5 03/27/2017 0355   VLDL 19 03/27/2017 0355   LDLCALC 126 (H) 03/27/2017 0355     Lab Results  Component Value Date   HGBA1C 5.6 03/27/2017     Lab Results  Component Value Date   MICROALBUR 0.50 08/28/2010    LDLCALC 126 (H) 03/27/2017   CREATININE 1.23 04/04/2017     HPI :  76 y.o.malewith a past medical history diabetes, hypertension and hyperlipidemia who had a  right MCA watershed stroke on 7/4 for which  he was admitted to [facility] and was discharged recently, was being brought back by family because the patient woke up today around 9 AM and wasn't acting normal. He refused to eat and was not speaking much as well.  Patient brought back due to recurrent symptoms/altered mental status. Neurology consulted  HOSPITAL COURSE:   Acute metabolic encephalopathy in the setting of UTI, recent CVA:  Right MCA scattered infarcts due to intracranial large vessel stenosis including right M1 occlusion, left MCA and bilateral PCA high-grade stenosis.   Resultant mild left arm pronator drift and dysmetria  CT head: Hyperdense left M2 branch possibly related to acute thrombosis versus atherosclerotic changes.   MRIhead: Right MCA scattered acute infarct,white matter infarcts and deep watershed pattern  MRAhead: Right M1 occlusion,  Advanced intracranial atherosclerosis with moderate bilateral ICA and proximal basilar stenosis. High-grade left MCA bifurcation and bilateral P2 segment stenoses.  CarotidDoppler: Unremarkable  2D Echo: EF 55-60%. No source of embolus  LDL126  HgbA1c5.6   Patient discharged on   aspirin 81 mg daily and clopidogrel 75 mg daily. Continue DAPT for 3 months and then Plavix alone due to intracranial stenosis.  Currently awaiting CIR Patient returned 7/10  due to difficulty swallowing-speech therapy evaluation 7/11-dysphagia 1, pured diet, thin liquids MRI brain on 7/11 showed further propagation of the acute right frontoparietal MCA infarct, urology does not feel any further stroke workup is necessary. Patient will need follow-up with Dr. Erlinda Hong in 6 weeks Patient also has urinary tract infection which needs to be treated   UTI Continue  Rocephin for another 3 days Urine culture no growth so far   Acute kidney injury Discharge creatinine 0.69, presented with a creatinine of 2.04, likely secondary to dehydration, slightly improved today 1.23, continue hydration and follow renal function closely, approaching baseline. Holding ARB/ACE inhibitor  Intracranial stenosis  MRA head right M1 clot occlusion, left M1 high-grade stenosis, bilateral PCA high-grade stenosis  On DAPTfor intracranial stenosis     Elevated TSH - Free T4 and T3 within normal limits as such will recommend patient have TSH reassessed within 4-6 weeks.  Hypertension - Hold Cozaar/HCTZ secondary to AKI, pending complete resolution   Hyperlipidemia - LDL 126 with goal less than 70, continue atorvastatin 80 mg by mouth daily this admission.  DM Hemoglobin A1c 5.6 Metformin/Amaryl on hold as his CBGs have been low Continue SSI   Discharge Exam: *  Blood pressure (!) 164/87, pulse 65, temperature 98.1 F (36.7 C), temperature source Oral, resp. rate 18, height '5\' 7"'  (1.702 m), weight 65.1 kg (143 lb 8.3 oz), SpO2 100 %.  Cardiovascular system: S1 & S2 heard, RRR. No JVD, murmurs, rubs, gallops or clicks. No pedal edema. Gastrointestinal system: Abdomen is nondistended, soft and nontender. No organomegaly or masses felt. Normal bowel sounds heard. Central nervous system: Not following commands Extremities: Symmetric 5 x 5 power. Skin: No rashes, lesions or ulcers Psychiatry: Judgement and insight impaired    Follow-up Information    Nolene Ebbs, MD. Call.   Specialty:  Internal Medicine Why:  Hospital follow-up in 3-5 days Contact information: [place] [address] [phone]        Garvin Fila, MD. Call.   Specialties:  Neurology, Radiology Why:  4-6 weeks Contact information: [address] [place] [address] [phone]           Signed: Reyne Dumas 04/05/2017, 12:50  PM        Time spent >1 hour

## 2017-04-05 NOTE — H&P (Signed)
Physical Medicine and Rehabilitation Admission H&P    Chief Complaint  Patient presents with  . Left inattention, left sided weakness,  mental status changes and speech deficits     HPI: [name] is a 76 y.o.  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 Patient with history of dizziness and progressive weakness since fall in 2015.  Per records he was discharged to home 7/5 at min assist level and HH therapy (he declined CIR).  He was readmitted on 04/02/17 with mental status changes, left sided weakness, lack of appetite and nonverbal state.  He was found to have UTI, acute renal failure with SCr 2.04 and as well as further propagation of acute right frontoparietal infarct. EEG without evidence of seizures. Neurology felt that patient with encephalopathy due to UTI in setting of progression of L-MCA stroke and to continue ASA/Plavix. Therapy evaluations done and CIR recommended for follow up therapy.      Review of Systems  Unable to perform ROS: Other  Constitutional: Negative for malaise/fatigue.  Gastrointestinal: Positive for constipation.  Neurological: Positive for dizziness (chronic issues X years), speech change, focal weakness and weakness.  Psychiatric/Behavioral: Positive for depression (came off meds 6 months ago. ).   Past Medical History:  Diagnosis Date  . Diabetes ([place])   . Dizziness   . Hypertension     Past Surgical History:  Procedure Laterality Date  . LAPAROSCOPIC APPENDECTOMY  02/21/2012   Procedure: APPENDECTOMY LAPAROSCOPIC;  Surgeon: Gwenyth Ober, MD;  Location: [facility] OR;  Service: General;  Laterality: N/A;    Family History  Problem Relation Age of Onset  . Diabetes Neg Hx   . Stroke Neg Hx   . Cancer Neg Hx     Social History:   Lives with family. Was independent with cane prior to last stroke. Per  reports he smokes  1-2  Cigarettes/day  in the winter. He has never used smokeless tobacco. Per reports he drinks a beer on occasion. He does not drink alcohol or use drugs.    Allergies: No Known Allergies    Medications Prior to Admission  Medication Sig Dispense Refill  . albuterol (PROVENTIL HFA;VENTOLIN HFA) 108 (90 Base) MCG/ACT inhaler Inhale 2 puffs into the lungs every 6 (six) hours as needed for wheezing or shortness of breath.    Marland Kitchen aspirin EC 81 MG tablet Take 81 mg by mouth daily.    Marland Kitchen atorvastatin (LIPITOR) 80 MG tablet Take 1 tablet (80 mg total) by mouth daily at 6 PM. 30 tablet 0  . clopidogrel (PLAVIX) 75 MG tablet Take 1 tablet (75 mg total) by mouth daily. 30 tablet 0  . fluticasone (FLONASE) 50 MCG/ACT nasal spray Place 2 sprays into both nostrils daily as needed for allergies.  3  . glimepiride (AMARYL) 4 MG tablet Take 4 mg by mouth daily with breakfast.    . lisinopril (PRINIVIL,ZESTRIL) 40 MG tablet Take 40 mg by mouth daily.    Marland Kitchen loratadine (CLARITIN) 10 MG tablet Take 10 mg by mouth daily.     Marland Kitchen losartan-hydrochlorothiazide (HYZAAR) 100-12.5 MG tablet Take 1 tablet by mouth daily.    . meclizine (ANTIVERT) 12.5 MG tablet Take 12.5 mg by mouth 3 (three) times daily as needed for dizziness.    . metFORMIN (GLUCOPHAGE) 500 MG tablet Take 500 mg by mouth 2 (two) times daily with a meal.  Home: Home Living Family/patient expects to be discharged to:: Private residence Living Arrangements: Other relatives, Children Available Help at Discharge: Family, Available 24 hours/day Type of Home: House Home Layout: Two level Alternate Level Stairs-Number of Steps: 12 Alternate Level Stairs-Rails: Right Bathroom Shower/Tub: Chiropodist: Standard Home Equipment: Cane - single point, Bedside commode, Shower seat, Wheelchair - manual, Environmental consultant - 2 wheels Additional Comments: Pt remains very quiet. Background from daughter and previous admission  Lives With: Daughter     Functional History: Prior Function Level of Independence: Independent with assistive device(s) Gait / Transfers Assistance Needed: pt ambulated with use of SPC ADL's / Homemaking Assistance Needed: Pt min A since previous CVA discharge Communication / Swallowing Assistance Needed: Ritta Slot- language  Functional Status:  Mobility: Bed Mobility Overal bed mobility: Needs Assistance Bed Mobility: Supine to Sit Supine to sit: Supervision General bed mobility comments: pt comes to EOB, but inattentive to L UE and does not accept L UE facilitation well. Transfers Overall transfer level: Needs assistance Equipment used: Rolling walker (2 wheeled), Straight cane Transfers: Sit to/from Stand Sit to Stand: Min assist General transfer comment: Pt declined at this time.  Ambulation/Gait Ambulation/Gait assistance: Mod assist Ambulation Distance (Feet): 35 Feet (then addt, 35', 30' and 30' with rests in between) Assistive device: Rolling walker (2 wheeled), Straight cane Gait Pattern/deviations: Step-through pattern, Decreased step length - left, Decreased stance time - right, Decreased stance time - left, Decreased stride length General Gait Details: Gait hemiparetic in nature with variable, but decreased advancement of L LE, decreased w/shift to the right.  pt drifts L into the RW which is being over-powered to the R with the stronger R side.  Pt appears resistant to facilitation of "normal" movement Gait velocity: decreased Gait velocity interpretation: Below normal speed for age/gender    ADL: ADL Overall ADL's : Needs assistance/impaired Eating/Feeding: Minimal assistance, With caregiver independent assisting, Sitting Eating/Feeding Details (indicate cue type and reason): Pt is not eating or drinking right now - even food that the family is bringing from home that would normally entice him Grooming: Set up, Sitting, Wash/dry face Grooming Details (indicate cue type and reason): bed  level Upper Body Bathing: Minimal assistance, Bed level Lower Body Bathing: Minimal assistance, Bed level Upper Body Dressing : Minimal assistance, Sitting Lower Body Dressing: Minimal assistance, Sitting/lateral leans Toilet Transfer:  (not attempted this session, STEDY in room) Toilet Transfer Details (indicate cue type and reason): Pt requests male staff assist with bathroom needs Toileting- Clothing Manipulation and Hygiene: Minimal assistance Functional mobility during ADLs:  (Pt declined at this time) General ADL Comments: Culturally Pt prefers male staff to assist with bathing and bathroom needs. OT explained that sometimes we only have male staff available. Pt is very flat, only communicated through wisper and daughter states it is very garbly.  Cognition: Cognition Overall Cognitive Status: Difficult to assess Arousal/Alertness: Awake/alert Orientation Level: Oriented X4 Attention: Focused Focused Attention: Impaired Problem Solving: Impaired Problem Solving Impairment: Functional basic Cognition Arousal/Alertness: Awake/alert Behavior During Therapy: Flat affect Overall Cognitive Status: Difficult to assess Area of Impairment: Following commands Following Commands: Follows one step commands with increased time General Comments: pt either resents taking direction or doesn't see (understand) the importance of following the directions.  It is difficult to discern and daughter not giving insight. Difficult to assess due to: Non-English speaking, Impaired communication  Physical Exam: Blood pressure (!) 164/87, pulse 65, temperature 98.1 F (36.7 C), temperature source Oral, resp. rate 18, height 5'  7" (1.702 m), weight 65.1 kg (143 lb 8.3 oz), SpO2 100 %. Physical Exam  Nursing note and vitals reviewed. Constitutional: He appears well-developed and well-nourished.  HENT:  Head: Normocephalic and atraumatic.  Mouth/Throat: Oropharynx is clear and moist.  Eyes: Pupils are  equal, round, and reactive to light. Right conjunctiva is injected.  Neck: Normal range of motion. Neck supple.  Cardiovascular: Normal rate and regular rhythm.   Respiratory: Effort normal and breath sounds normal. No stridor. No respiratory distress. He has no wheezes.  GI: Soft. Bowel sounds are normal. He exhibits no distension. There is no tenderness.  Musculoskeletal: He exhibits no edema or tenderness.  Neurological: He is alert.  Flat affect. Does not make eye contact. Limited verbal output--attempted to whisper once but made no attempt to answer questions interpreted by family.  Family reports ability to express basic needs. Moves all 4's but demonstrates weakness on left more than right---however effort is inconsistent. Senses pain on all 4's.   Skin: Skin is warm and dry.  Psychiatric: His affect is blunt. He is withdrawn. He exhibits a depressed mood. He is noncommunicative.    Results for orders placed or performed during the hospital encounter of 04/02/17 (from the past 48 hour(s))  Urine Culture     Status: None   Collection Time: 04/03/17  3:41 PM  Result Value Ref Range   Specimen Description URINE, CLEAN CATCH    Special Requests NONE    Culture NO GROWTH    Report Status 04/04/2017 FINAL   Glucose, capillary     Status: None   Collection Time: 04/03/17  4:50 PM  Result Value Ref Range   Glucose-Capillary 81 65 - 99 mg/dL   Comment 1 Notify RN    Comment 2 Document in Chart   Glucose, capillary     Status: Abnormal   Collection Time: 04/03/17  7:14 PM  Result Value Ref Range   Glucose-Capillary 103 (H) 65 - 99 mg/dL   Comment 1 Notify RN    Comment 2 Document in Chart   Glucose, capillary     Status: None   Collection Time: 04/03/17 11:22 PM  Result Value Ref Range   Glucose-Capillary 72 65 - 99 mg/dL   Comment 1 Notify RN    Comment 2 Document in Chart   Glucose, capillary     Status: None   Collection Time: 04/04/17  3:31 AM  Result Value Ref Range    Glucose-Capillary 99 65 - 99 mg/dL   Comment 1 Notify RN    Comment 2 Document in Chart   Comprehensive metabolic panel     Status: Abnormal   Collection Time: 04/04/17  5:16 AM  Result Value Ref Range   Sodium 141 135 - 145 mmol/L   Potassium 3.6 3.5 - 5.1 mmol/L    Comment: DELTA CHECK NOTED   Chloride 111 101 - 111 mmol/L   CO2 18 (L) 22 - 32 mmol/L   Glucose, Bld 79 65 - 99 mg/dL   BUN 22 (H) 6 - 20 mg/dL   Creatinine, Ser 1.23 0.61 - 1.24 mg/dL   Calcium 8.8 (L) 8.9 - 10.3 mg/dL   Total Protein 6.9 6.5 - 8.1 g/dL   Albumin 3.4 (L) 3.5 - 5.0 g/dL   AST 76 (H) 15 - 41 U/L   ALT 67 (H) 17 - 63 U/L   Alkaline Phosphatase 49 38 - 126 U/L   Total Bilirubin 1.7 (H) 0.3 - 1.2 mg/dL   GFR  calc non Af Amer 56 (L) >60 mL/min   GFR calc Af Amer >60 >60 mL/min    Comment: (NOTE) The eGFR has been calculated using the CKD EPI equation. This calculation has not been validated in all clinical situations. eGFR's persistently <60 mL/min signify possible Chronic Kidney Disease.    Anion gap 12 5 - 15  CBC     Status: None   Collection Time: 04/04/17  5:16 AM  Result Value Ref Range   WBC 6.4 4.0 - 10.5 K/uL   RBC 4.50 4.22 - 5.81 MIL/uL   Hemoglobin 14.8 13.0 - 17.0 g/dL   HCT 42.2 39.0 - 52.0 %   MCV 93.8 78.0 - 100.0 fL   MCH 32.9 26.0 - 34.0 pg   MCHC 35.1 30.0 - 36.0 g/dL   RDW 12.8 11.5 - 15.5 %   Platelets 161 150 - 400 K/uL  Glucose, capillary     Status: None   Collection Time: 04/04/17  8:09 AM  Result Value Ref Range   Glucose-Capillary 80 65 - 99 mg/dL  Glucose, capillary     Status: Abnormal   Collection Time: 04/04/17 11:30 AM  Result Value Ref Range   Glucose-Capillary 102 (H) 65 - 99 mg/dL   Comment 1 Notify RN    Comment 2 Document in Chart   Glucose, capillary     Status: None   Collection Time: 04/04/17  4:38 PM  Result Value Ref Range   Glucose-Capillary 97 65 - 99 mg/dL  Glucose, capillary     Status: None   Collection Time: 04/04/17  7:13 PM  Result  Value Ref Range   Glucose-Capillary 87 65 - 99 mg/dL   Comment 1 Notify RN    Comment 2 Document in Chart   Glucose, capillary     Status: None   Collection Time: 04/04/17 11:22 PM  Result Value Ref Range   Glucose-Capillary 84 65 - 99 mg/dL   Comment 1 Notify RN    Comment 2 Document in Chart   Glucose, capillary     Status: None   Collection Time: 04/05/17  3:22 AM  Result Value Ref Range   Glucose-Capillary 81 65 - 99 mg/dL   Comment 1 Notify RN    Comment 2 Document in Chart   Glucose, capillary     Status: None   Collection Time: 04/05/17  7:55 AM  Result Value Ref Range   Glucose-Capillary 92 65 - 99 mg/dL   No results found.     Medical Problem List and Plan: 1.  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  DVT Prophylaxis/Anticoagulation: Pharmaceutical: Lovenox 3. Pain Management: N/A 4. Mood: LCSW to follow for evaluation and support.  5. Neuropsych: This patient is not capable of making decisions on his own behalf. 6. Skin/Wound Care: routine pressure relief measures.  7. Fluids/Electrolytes/Nutrition: Encourage po intake. On dysphagia 1, thin liquids.  8. Pyuria:  On Ceftriaxone D # 3/5?  9. Low B 12 levels: Low normal levels supplemented by Im injections.  10. Acute renal failure: Improved with hydration.  11. Dyslipidemia: On Lipitor.   Post Admission Physician Evaluation: 1. Functional deficits secondary  to right MCA infarct. 2. Patient is admitted to receive collaborative, interdisciplinary care between the physiatrist, rehab nursing staff, and therapy team. 3. Patient's level of medical complexity and substantial therapy needs in context of that medical necessity cannot be provided at a lesser intensity of care  such as a SNF. 4. Patient has experienced substantial functional loss from his/her baseline which was documented above under the "Functional History" and "Functional Status"  headings.  Judging by the patient's diagnosis, physical exam, and functional history, the patient has potential for functional progress which will result in measurable gains while on inpatient rehab.  These gains will be of substantial and practical use upon discharge  in facilitating mobility and self-care at the household level. 5. Physiatrist will provide 24 hour management of medical needs as well as oversight of the therapy plan/treatment and provide guidance as appropriate regarding the interaction of the two. 6. The Preadmission Screening has been reviewed and patient status is unchanged unless otherwise stated above. 7. 24 hour rehab nursing will assist with bladder management, bowel management, safety, skin/wound care, disease management, medication administration, pain management and patient education  and help integrate therapy concepts, techniques,education, etc. 8. PT will assess and treat for/with: Lower extremity strength, range of motion, stamina, balance, functional mobility, safety, adaptive techniques and equipment, NMR, family education, ego support.   Goals are: supervision. 9. OT will assess and treat for/with: ADL's, functional mobility, safety, upper extremity strength, adaptive techniques and equipment, NMR, family education, adaptive equipment, .   Goals are: supervision. Therapy may proceed with showering this patient. 10. SLP will assess and treat for/with: cognition, communication, behavior.  Goals are: supervision to mod I. 11. Case Management and Social Worker will assess and treat for psychological issues and discharge planning. 12. Team conference will be held weekly to assess progress toward goals and to determine barriers to discharge. 13. Patient will receive at least 3 hours of therapy per day at least 5 days per week. 14. ELOS: 7-10 days       15. Prognosis:  excellent     Meredith Staggers, MD, [place] [facility] 04/05/2017  Bary Leriche, Hershal Coria 04/05/2017

## 2017-04-05 NOTE — Progress Notes (Signed)
[facility]  Patient Status: Active (receiving services up to time of hospitalization)  [facility] is providing the following services: PT and OT  If patient discharges after hours, please call [phone].   Avie EchevariaKaren Nussbaum 04/05/2017, 2:11 PM

## 2017-04-05 NOTE — Progress Notes (Signed)
Inpatient Rehabilitation  Met with patient, daughter, and grandson at bedside to discuss team's recommendation for IP Rehab.  Shared booklets and answered questions.  Dr. Naaman Plummer and myself discussed that while we acknowledge patient's preference be that he has male care givers, at times there may not be male care providers available and as a result, he will have male caregivers.  Patient and family in agreement with this.  I have also called patient's daughter, Lynann Beaver who he lives with and a wait a return call to confirm IP Rehab.  Plan to follow up with the team as I know.  Please call with questions.   Carmelia Roller., CCC/SLP Admission Coordinator  [place]  Cell [phone]

## 2017-04-05 NOTE — PMR Pre-admission (Signed)
PMR Admission Coordinator Pre-Admission Assessment  Patient: [name] is an 76 y.o., male MRN: [id number] DOB: 06/07/1941 Height: 5\' 7"  (170.2 cm) Weight: 65.1 kg (143 lb 8.3 oz)              Insurance Information HMO:     PPO:      PCP:      IPA:      80/20:      OTHER:  PRIMARY: Medicaid [place] [employer]      Policy#: [id number]      Subscriber: Self CM Name:       Phone#:      Fax#:  Pre-Cert#: coverage code MAACY      Employer:   Benefits:  Phone #: [phone]     Name: automated system  Eff. 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 left handed [place] from [place] with history of T2DM, depression, dizziness, recent right frontoparietal/MCA infarct 03/27/17 with right gaze preference and left inattention. Patient with history of dizziness and progressive weakness since fall in 2015. Per chart review, [facility] ([facility] declined [facility]). He was readmitted on 04/02/17 with mental status changes, left sided weakness, lack of appetite, and nonverbal state. He was found to have UTI and as well as further propagation of acute right frontoparietal infarct. EEG without evidence of seizures. Neurology felt that patient with encephalopathy due to UTI in setting of progression of L-MCA stroke and to continue ASA/Plavix. Therapy evaluations done and CIR  recommended for follow up therapy and patient admitted 04/05/17.  NIH Total: 3   Past Medical History  Past Medical History:  Diagnosis Date  . Diabetes (HCC)   . Dizziness   . Hypertension    Family History  family history is not on file.  Prior Rehab/Hospitalizations:  Has the patient had major surgery during 100 days prior to admission? No  Current Medications   Current Facility-Administered Medications:  .  0.9 %  sodium chloride infusion, , Intravenous, Continuous, Vann, Jessica U, DO, Last Rate: 100 mL/hr at 04/04/17 0400 .  acetaminophen (TYLENOL) tablet 650 mg, 650 mg, Oral, Q4H PRN **OR** acetaminophen (TYLENOL) solution 650 mg, 650 mg, Per Tube, Q4H PRN **OR** acetaminophen (TYLENOL) suppository 650 mg, 650 mg, Rectal, Q4H PRN, Vann, Jessica U, DO .  albuterol (PROVENTIL) (2.5 MG/3ML) 0.083% nebulizer solution 2.5 mg, 2.5 mg, Nebulization, Q6H PRN, Vann, Jessica U, DO .  aspirin chewable tablet 81 mg, 81 mg, Oral, Once, Milon DikesArora, Ashish, MD .  aspirin EC tablet 81 mg, 81 mg, Oral, Daily, Vann, Jessica U, DO, 81 mg at 04/05/17 1000 .  atorvastatin (LIPITOR)  tablet 80 mg, 80 mg, Oral, Daily, Milon Dikes, MD, 80 mg at 04/05/17 1103 .  cefTRIAXone (ROCEPHIN) 1 g in dextrose 5 % 50 mL IVPB, 1 g, Intravenous, Q24H, Abrol, Nayana, MD, Last Rate: 100 mL/hr at 04/05/17 1122, 1 g at 04/05/17 1122 .  clopidogrel (PLAVIX) tablet 75 mg, 75 mg, Oral, Daily, Vann, Jessica U, DO, 75 mg at 04/05/17 1103 .  cyanocobalamin ((VITAMIN B-12)) injection 1,000 mcg, 1,000 mcg, Intramuscular, Daily, Vann, Jessica U, DO, 1,000 mcg at 04/05/17 1122 .  heparin injection 5,000 Units, 5,000 Units, Subcutaneous, Q8H, Milon Dikes, MD, 5,000 Units at 04/05/17 [phone] .  insulin aspart (novoLOG) injection 0-9 Units, 0-9 Units, Subcutaneous, Q4H, Vann, Jessica U, DO  Patients Current Diet: DIET - DYS 1 Room service appropriate? Yes; Fluid consistency: Thin Diet - low sodium heart healthy  Precautions /  Restrictions Precautions Precautions: Fall Precaution Comments: left inattention Restrictions Weight Bearing Restrictions: No   Has the patient had 2 or more falls or a fall with injury in the past year?No  Prior Activity Level Household: Prior to admission patient was able to walk short distances in his daughter's house.  Family was available 24/7 to assist as needed.   Home Assistive Devices / Equipment Home Equipment: Gilmer Mor - single point, Bedside commode, Shower seat, Wheelchair - manual, Environmental consultant - 2 wheels  Prior Device Use: Indicate devices/aids used by the patient prior to current illness, exacerbation or injury? Cane, walker  Prior Functional Level Prior Function Level of Independence: Independent with assistive device(s) Gait / Transfers Assistance Needed: pt ambulated with use of SPC ADL's / Homemaking Assistance Needed: Pt min A since previous CVA discharge Communication / Swallowing Assistance Needed: [name]- language  Self Care: Did the patient need help bathing, dressing, using the toilet or eating? Needed some help  Indoor Mobility: Did the patient need assistance with walking from room to room (with or without device)? Needed some help  Stairs: Did the patient need assistance with internal or external stairs (with or without device)? Needed some help  Functional Cognition: Did the patient need help planning regular tasks such as shopping or remembering to take medications? Dependent  Current Functional Level Cognition  Arousal/Alertness: Awake/alert Overall Cognitive Status: Difficult to assess Difficult to assess due to: Non-English speaking, Impaired communication Orientation Level: Oriented X4 Following Commands: Follows one step commands with increased time General Comments: pt either resents taking direction or doesn't see (understand) the importance of following the directions.  It is difficult to discern and daughter not giving insight. Attention:  Focused Focused Attention: Impaired Problem Solving: Impaired Problem Solving Impairment: Functional basic    Extremity Assessment (includes Sensation/Coordination)  Upper Extremity Assessment: Generalized weakness (L weaker than R, Pt with very little participation)  Lower Extremity Assessment: Defer to PT evaluation    ADLs  Overall ADL's : Needs assistance/impaired Eating/Feeding: Minimal assistance, With caregiver independent assisting, Sitting Eating/Feeding Details (indicate cue type and reason): Pt is not eating or drinking right now - even food that the family is bringing from home that would normally entice him Grooming: Set up, Sitting, Wash/dry face Grooming Details (indicate cue type and reason): bed level Upper Body Bathing: Minimal assistance, Bed level Lower Body Bathing: Minimal assistance, Bed level Upper Body Dressing : Minimal assistance, Sitting Lower Body Dressing: Minimal assistance, Sitting/lateral leans Toilet Transfer:  (not attempted this session, STEDY in room) Toilet Transfer Details (indicate cue type and reason): Pt requests male staff assist with bathroom needs Toileting- Clothing Manipulation and  Hygiene: Minimal assistance Functional mobility during ADLs:  (Pt declined at this time) General ADL Comments: Culturally Pt prefers male staff to assist with bathing and bathroom needs. OT explained that sometimes we only have male staff available. Pt is very flat, only communicated through wisper and daughter states it is very garbly.    Mobility  Overal bed mobility: Needs Assistance Bed Mobility: Supine to Sit Supine to sit: Supervision General bed mobility comments: pt comes to EOB, but inattentive to L UE and does not accept L UE facilitation well.    Transfers  Overall transfer level: Needs assistance Equipment used: Rolling walker (2 wheeled), Straight cane Transfers: Sit to/from Stand Sit to Stand: Min assist General transfer comment: Pt  declined at this time.     Ambulation / Gait / Stairs / Wheelchair Mobility  Ambulation/Gait Ambulation/Gait assistance: Mod assist Ambulation Distance (Feet): 35 Feet (then addt, 35', 30' and 30' with rests in between) Assistive device: Rolling walker (2 wheeled), Straight cane Gait Pattern/deviations: Step-through pattern, Decreased step length - left, Decreased stance time - right, Decreased stance time - left, Decreased stride length General Gait Details: Gait hemiparetic in nature with variable, but decreased advancement of L LE, decreased w/shift to the right.  pt drifts L into the RW which is being over-powered to the R with the stronger R side.  Pt appears resistant to facilitation of "normal" movement Gait velocity: decreased Gait velocity interpretation: Below normal speed for age/gender    Posture / Balance Dynamic Sitting Balance Sitting balance - Comments: sitting EOB Balance Overall balance assessment: Needs assistance Sitting-balance support: Feet supported, No upper extremity supported Sitting balance-Leahy Scale: Good Sitting balance - Comments: sitting EOB Standing balance support: Bilateral upper extremity supported, Single extremity supported Standing balance-Leahy Scale: Poor Standing balance comment: needs min assist for stability    Special needs/care consideration BiPAP/CPAP: No CPM: No Continuous Drip IV: No Dialysis: No        Life Vest: No Oxygen: No Special Bed: No Trach Size: No Wound Vac (area): No Skin: WDL                              Bowel mgmt: 04/04/17 Continent  Bladder mgmt: Continent  Diabetic mgmt:HgbA1c (03/27/2017) 5.6      Previous Home Environment Living Arrangements: Other relatives, Children  Lives With: Daughter Available Help at Discharge: Family, Available 24 hours/day Type of Home: House Home Layout: Two level Alternate Level Stairs-Rails: Right Alternate Level Stairs-Number of Steps: 12 Bathroom Shower/Tub: Teacher, music: Standard Home Care Services: No Additional Comments: Pt remains very quiet. Background from daughter and previous admission  Discharge Living Setting Plans for Discharge Living Setting: Lives with (comment) (daughter ) Type of Home at Discharge: Apartment (on second floor) Discharge Home Layout: One level Discharge Home Access: Stairs to enter Entrance Stairs-Rails: Right Entrance Stairs-Number of Steps: 12 Discharge Bathroom Shower/Tub: Tub/shower unit Discharge Bathroom Toilet: Standard Discharge Bathroom Accessibility: Yes How Accessible: Accessible via walker Does the patient have any problems obtaining your medications?: No  Social/Family/Support Systems Patient Roles: Parent, Other (Comment) (grandparent ) Contact Information: Daughter [place]it[place]o [place] Anticipated Caregiver: daughter and her family  Anticipated Caregiver's Contact Information: [phone] Ability/Limitations of Caregiver: family will provide 24/7 care  Caregiver Availability: 24/7 Discharge Plan Discussed with Primary Caregiver: Yes Is Caregiver In Agreement with Plan?: Yes Does Caregiver/Family have Issues with Lodging/Transportation while Pt is in Rehab?: No  Goals/Additional Needs Patient/Family  Goal for Rehab: PT/OT/SLP Supervision-Mod I  Expected length of stay: 10-12 days Cultural Considerations: Patient speaks Nepali and prefers male caregivers when available  Dietary Needs: Dys.1 textures and thin liqudis  Equipment Needs: TBD Special Service Needs: Patient requires family support and an intrepreter Additional Information: Intrepreter arranged for Sat, Sun, and Mon Pt/Family Agrees to Admission and willing to participate: Yes Program Orientation Provided & Reviewed with Pt/Caregiver Including Roles  & Responsibilities: Yes Additional Information Needs: None Information Needs to be Provided By: N/A  Decrease burden of Care through IP rehab admission: No  Possible need for SNF  placement upon discharge: No  Patient Condition: This patient's medical and functional status has changed since the consult dated 04/04/17 in which the Rehabilitation Physician determined and documented that the patient was potentially appropriate for intensive rehabilitative care in an inpatient rehabilitation facility. Issues have been addressed and update has been discussed with Dr. Riley Kill and patient now appropriate for inpatient rehabilitation. Will admit to inpatient rehab today.   Preadmission Screen Completed By:  Fae Pippin, 04/05/2017 1:30 PM ______________________________________________________________________   Discussed status with Dr. Riley Kill on 04/05/17 at 1330 and received telephone approval for admission today.  Admission Coordinator:  Fae Pippin, time 1330/Date 04/05/17

## 2017-04-05 NOTE — Progress Notes (Signed)
Pt arrived to room 4W16 accompanied by daughter, granddaughters and grandson.  Pt mostly non-compliant with assessment. Pt and daughter oriented to room and rehab process.Pt's VSS, denies pain.

## 2017-04-05 NOTE — Progress Notes (Signed)
Fae Pippin [facility] Admission Coordinator Signed Physical Medicine and Rehabilitation  PMR Pre-admission Date of Service: 04/05/2017 11:35 AM  Related encounter: ED to Hosp-Admission (Discharged) from 04/02/2017 in [facility]       [] Hide copied text PMR Admission Coordinator Pre-Admission Assessment  Patient: [name] is an 76 y.o., male MRN: [id number] DOB: 1941/04/26 Height: 5\' 7"  (170.2 cm) Weight: 65.1 kg (143 lb 8.3 oz)                                                                                                                                                  Insurance Information HMO:     PPO:      PCP:      IPA:      80/20:      OTHER:  PRIMARY: Medicaid [place] [employer]      Policy#: [id number]      Subscriber: Self CM Name:       Phone#:      Fax#:  Pre-Cert#: coverage code [id number]      Employer:   Benefits:  Phone #: [phone]     Name: automated system  Eff. 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 Patient with history of dizziness and progressive weakness since fall in 2015. Per chart review, he was discharged to home 03/28/17 at [facility] assist level and  [facility] therapy (he declined CIR). He was readmitted on 04/02/17 with mental status changes, left sided weakness, lack of appetite, and nonverbal state. He was found to have UTI and as well as further propagation of acute right frontoparietal infarct. EEG without evidence of seizures. Neurology felt that patient with encephalopathy due to UTI in setting of progression of L-MCA stroke and to continue ASA/Plavix. Therapy evaluations done and CIR recommended for follow up therapy and patient admitted 04/05/17.  NIH Total: 3 Past Medical History      Past Medical History:  Diagnosis Date  . Diabetes (HCC)   . Dizziness   . Hypertension    Family History  family history is not on file.  Prior Rehab/Hospitalizations:  Has the patient had major surgery during 100 days prior to admission? No  Current Medications   Current Facility-Administered Medications:  .  0.9 %  sodium chloride infusion, , Intravenous, Continuous, Vann, Jessica U, DO, Last Rate: 100 mL/hr at 04/04/17 0400 .  acetaminophen (TYLENOL) tablet 650 mg, 650 mg, Oral, Q4H PRN **OR** acetaminophen (TYLENOL) solution 650 mg, 650 mg, Per Tube, Q4H PRN **OR** acetaminophen (TYLENOL) suppository 650 mg, 650 mg, Rectal, Q4H PRN, Vann, Jessica U, DO .  albuterol (PROVENTIL) (2.5 MG/3ML) 0.083% nebulizer solution 2.5 mg, 2.5 mg, Nebulization, Q6H PRN, Vann, Jessica U, DO .  aspirin chewable tablet 81 mg, 81 mg, Oral, Once, Milon DikesArora, Ashish, MD .  aspirin EC tablet 81 mg, 81 mg, Oral, Daily, Vann, Jessica U, DO, 81 mg at 04/05/17 1000 .  atorvastatin (LIPITOR) tablet 80 mg, 80 mg, Oral, Daily, Milon DikesArora, Ashish, MD, 80 mg at 04/05/17 1103 .  cefTRIAXone (ROCEPHIN) 1 g in dextrose 5 % 50 mL IVPB, 1 g, Intravenous, Q24H, Abrol, Nayana, MD, Last Rate: 100 mL/hr at 04/05/17 1122, 1 g at 04/05/17 1122 .  clopidogrel (PLAVIX) tablet 75 mg, 75 mg, Oral, Daily, Vann, Jessica U, DO, 75 mg at 04/05/17 1103 .  cyanocobalamin ((VITAMIN B-12)) injection 1,000  mcg, 1,000 mcg, Intramuscular, Daily, Vann, Jessica U, DO, 1,000 mcg at 04/05/17 1122 .  heparin injection 5,000 Units, 5,000 Units, Subcutaneous, Q8H, Milon DikesArora, Ashish, MD, 5,000 Units at 04/05/17 [phone] .  insulin aspart (novoLOG) injection 0-9 Units, 0-9 Units, Subcutaneous, Q4H, Vann, Jessica U, DO  Patients Current Diet: DIET - DYS 1 Room service appropriate? Yes; Fluid consistency: Thin Diet - low sodium heart healthy  Precautions / Restrictions Precautions Precautions: Fall Precaution Comments: left inattention Restrictions Weight Bearing Restrictions: No   Has the patient had 2 or more falls or a fall with injury in the past year?No  Prior Activity Level Household: Prior to admission patient was able to walk short distances in his daughter's house.  Family was available 24/7 to assist as needed.   Home Assistive Devices / Equipment Home Equipment: Gilmer MorCane - single point, Bedside commode, Shower seat, Wheelchair - manual, Environmental consultantWalker - 2 wheels  Prior Device Use: Indicate devices/aids used by the patient prior to current illness, exacerbation or injury? Cane, walker  Prior Functional Level Prior Function Level of Independence: Independent with assistive device(s) Gait / Transfers Assistance Needed: pt ambulated with use of SPC ADL's / Homemaking Assistance Needed: Pt min A since previous CVA discharge Communication / Swallowing Assistance Needed: Tery Sanfilippoapoli- language  Self Care: Did the patient need help bathing, dressing, using the toilet or eating? Needed some help  Indoor Mobility: Did the patient need assistance with walking from room to room (with or without device)? Needed some help  Stairs: Did the patient need assistance with internal or external stairs (with or without device)? Needed some help  Functional Cognition: Did the patient need help planning regular tasks such as shopping or remembering to take medications? Dependent  Current Functional Level Cognition   Arousal/Alertness: Awake/alert Overall Cognitive Status: Difficult to assess Difficult to assess due to: Non-English speaking, Impaired communication Orientation Level: Oriented X4 Following Commands: Follows one step commands with increased time General Comments: pt either resents taking  direction or doesn't see (understand) the importance of following the directions.  It is difficult to discern and daughter not giving insight. Attention: Focused Focused Attention: Impaired Problem Solving: Impaired Problem Solving Impairment: Functional basic    Extremity Assessment (includes Sensation/Coordination)  Upper Extremity Assessment: Generalized weakness (L weaker than R, Pt with very little participation)  Lower Extremity Assessment: Defer to PT evaluation    ADLs  Overall ADL's : Needs assistance/impaired Eating/Feeding: Minimal assistance, With caregiver independent assisting, Sitting Eating/Feeding Details (indicate cue type and reason): Pt is not eating or drinking right now - even food that the family is bringing from home that would normally entice him Grooming: Set up, Sitting, Wash/dry face Grooming Details (indicate cue type and reason): bed level Upper Body Bathing: Minimal assistance, Bed level Lower Body Bathing: Minimal assistance, Bed level Upper Body Dressing : Minimal assistance, Sitting Lower Body Dressing: Minimal assistance, Sitting/lateral leans Toilet Transfer:  (not attempted this session, STEDY in room) Toilet Transfer Details (indicate cue type and reason): Pt requests male staff assist with bathroom needs Toileting- Clothing Manipulation and Hygiene: Minimal assistance Functional mobility during ADLs:  (Pt declined at this time) General ADL Comments: Culturally Pt prefers male staff to assist with bathing and bathroom needs. OT explained that sometimes we only have male staff available. Pt is very flat, only communicated through wisper and daughter states it  is very garbly.    Mobility  Overal bed mobility: Needs Assistance Bed Mobility: Supine to Sit Supine to sit: Supervision General bed mobility comments: pt comes to EOB, but inattentive to L UE and does not accept L UE facilitation well.    Transfers  Overall transfer level: Needs assistance Equipment used: Rolling walker (2 wheeled), Straight cane Transfers: Sit to/from Stand Sit to Stand: Min assist General transfer comment: Pt declined at this time.     Ambulation / Gait / Stairs / Wheelchair Mobility  Ambulation/Gait Ambulation/Gait assistance: Mod assist Ambulation Distance (Feet): 35 Feet (then addt, 35', 30' and 30' with rests in between) Assistive device: Rolling walker (2 wheeled), Straight cane Gait Pattern/deviations: Step-through pattern, Decreased step length - left, Decreased stance time - right, Decreased stance time - left, Decreased stride length General Gait Details: Gait hemiparetic in nature with variable, but decreased advancement of L LE, decreased w/shift to the right.  pt drifts L into the RW which is being over-powered to the R with the stronger R side.  Pt appears resistant to facilitation of "normal" movement Gait velocity: decreased Gait velocity interpretation: Below normal speed for age/gender    Posture / Balance Dynamic Sitting Balance Sitting balance - Comments: sitting EOB Balance Overall balance assessment: Needs assistance Sitting-balance support: Feet supported, No upper extremity supported Sitting balance-Leahy Scale: Good Sitting balance - Comments: sitting EOB Standing balance support: Bilateral upper extremity supported, Single extremity supported Standing balance-Leahy Scale: Poor Standing balance comment: needs min assist for stability    Special needs/care consideration BiPAP/CPAP: No CPM: No Continuous Drip IV: No Dialysis: No        Life Vest: No Oxygen: No Special Bed: No Trach Size: No Wound Vac (area): No Skin: WDL                               Bowel mgmt: 04/04/17 Continent  Bladder mgmt: Continent  Diabetic mgmt:HgbA1c(03/27/2017) 5.6      Previous Home Environment Living Arrangements: Other relatives, Children  Lives With: Daughter Available Help at  Discharge: Family, Available 24 hours/day Type of Home: House Home Layout: Two level Alternate Level Stairs-Rails: Right Alternate Level Stairs-Number of Steps: 12 Bathroom Shower/Tub: Engineer, manufacturing systems: Standard Home Care Services: No Additional Comments: Pt remains very quiet. Background from daughter and previous admission  Discharge Living Setting Plans for Discharge Living Setting: Lives with (comment) (daughter ) Type of Home at Discharge: Apartment (on second floor) Discharge Home Layout: One level Discharge Home Access: Stairs to enter Entrance Stairs-Rails: Right Entrance Stairs-Number of Steps: 12 Discharge Bathroom Shower/Tub: Tub/shower unit Discharge Bathroom Toilet: Standard Discharge Bathroom Accessibility: Yes How Accessible: Accessible via walker Does the patient have any problems obtaining your medications?: No  Social/Family/Support Systems Patient Roles: Parent, Other (Comment) (grandparent ) Contact Information: Daughter [name] Anticipated Caregiver: daughter and her family  Anticipated Caregiver's Contact Information: [phone] Ability/Limitations of Caregiver: family will provide 24/7 care  Caregiver Availability: 24/7 Discharge Plan Discussed with Primary Caregiver: Yes Is Caregiver In Agreement with Plan?: Yes Does Caregiver/Family have Issues with Lodging/Transportation while Pt is in Rehab?: No  Goals/Additional Needs Patient/Family Goal for Rehab: PT/OT/SLP Supervision-Mod I  Expected length of stay: 10-12 days Cultural Considerations: Patient speaks Nepali and prefers male caregivers when available  Dietary Needs: Dys.1 textures and thin liqudis  Equipment Needs: TBD Special  Service Needs: Patient requires family support and an intrepreter Additional Information: Intrepreter arranged for Sat, Sun, and Mon Pt/Family Agrees to Admission and willing to participate: Yes Program Orientation Provided & Reviewed with Pt/Caregiver Including Roles  & Responsibilities: Yes Additional Information Needs: None Information Needs to be Provided By: N/A  Decrease burden of Care through IP rehab admission: No  Possible need for SNF placement upon discharge: No  Patient Condition: This patient's medical and functional status has changed since the consult dated 04/04/17 in which the Rehabilitation Physician determined and documented that the patient was potentially appropriate for intensive rehabilitative care in an inpatient rehabilitation facility. Issues have been addressed and update has been discussed with Dr. Riley Kill and patient now appropriate for inpatient rehabilitation. Will admit to inpatient rehab today.   Preadmission Screen Completed By:  Fae Pippin, 04/05/2017 1:30 PM ______________________________________________________________________   Discussed status with Dr. Riley Kill on 04/05/17 at 1330 and received telephone approval for admission today.  Admission Coordinator:  Fae Pippin, time 1330/Date 04/05/17       Cosigned by: Ranelle Oyster, MD at 04/05/2017 1:59 PM  Revision History

## 2017-04-05 NOTE — H&P (Signed)
Physical Medicine and Rehabilitation Admission H&P       Chief Complaint  Patient presents with  . 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 Patient with history of dizziness and progressive weakness since fall in 2015. Per records he was discharged to home 7/5 at min assist level and HH therapy (he declined CIR). He was readmitted on 04/02/17 with mental status changes, left sided weakness, lack of appetite and nonverbal state. He was found to have UTI, acute renal failure with SCr 2.04 and as well as further propagation of acute right frontoparietal infarct. EEG without evidence of seizures. Neurology felt that patient with encephalopathy due to UTI in setting of progression of L-MCA stroke and to continue ASA/Plavix. Therapy evaluations done and CIR recommended for follow up therapy.      Review of Systems  Unable to perform ROS: Other  Constitutional: Negative for malaise/fatigue.  Gastrointestinal: Positive for constipation.  Neurological: Positive for dizziness (chronic issues X years), speech change, focal weakness and weakness.  Psychiatric/Behavioral: Positive for depression (came off meds 6 months ago. ).       Past Medical History:  Diagnosis Date  . Diabetes ([place])   . Dizziness   . Hypertension          Past Surgical History:  Procedure Laterality Date  . LAPAROSCOPIC APPENDECTOMY  02/21/2012   Procedure: APPENDECTOMY LAPAROSCOPIC;  Surgeon: Gwenyth Ober, MD;  Location: [facility];  Service: General;  Laterality: N/A;         Family History  Problem Relation Age of Onset  . Diabetes Neg Hx   . Stroke Neg Hx   . Cancer Neg Hx     Social History:  Lives with family. Was independent with cane  prior to last stroke. Perreports he smokes 1-2 Cigarettes/day in the winter. He has never used smokeless tobacco. Per reports he drinks a beer on occasion. He does not drink alcohol or use drugs.    Allergies: No Known Allergies          Medications Prior to Admission  Medication Sig Dispense Refill  . albuterol (PROVENTIL HFA;VENTOLIN HFA) 108 (90 Base) MCG/ACT inhaler Inhale 2 puffs into the lungs every 6 (six) hours as needed for wheezing or shortness of breath.    Marland Kitchen aspirin EC 81 MG tablet Take 81 mg by mouth daily.    Marland Kitchen atorvastatin (LIPITOR) 80 MG tablet Take 1 tablet (80 mg total) by mouth daily at 6 PM. 30 tablet 0  . clopidogrel (PLAVIX) 75 MG tablet Take 1 tablet (75 mg total) by mouth daily. 30 tablet 0  . fluticasone (FLONASE) 50 MCG/ACT nasal spray Place 2 sprays into both nostrils daily as needed for allergies.  3  . glimepiride (AMARYL) 4 MG tablet Take 4 mg by mouth daily with breakfast.    . lisinopril (PRINIVIL,ZESTRIL) 40 MG tablet Take 40 mg by mouth daily.    Marland Kitchen loratadine (CLARITIN) 10 MG tablet Take 10 mg by mouth daily.     Marland Kitchen losartan-hydrochlorothiazide (HYZAAR) 100-12.5 MG tablet Take 1 tablet by mouth daily.    . meclizine (ANTIVERT) 12.5 MG tablet Take 12.5 mg by mouth 3 (three) times daily as needed for dizziness.    . metFORMIN (GLUCOPHAGE) 500 MG tablet Take 500 mg by  mouth 2 (two) times daily with a meal.      Home: Home Living Family/patient expects to be discharged to:: Private residence Living Arrangements: Other relatives, Children Available Help at Discharge: Family, Available 24 hours/day Type of Home: House Home Layout: Two level Alternate Level Stairs-Number of Steps: 12 Alternate Level Stairs-Rails: Right Bathroom Shower/Tub: Chiropodist: Standard Home Equipment: Cane - single point, Bedside commode, Shower seat, Wheelchair - manual, Environmental consultant - 2 wheels Additional Comments: Pt remains very quiet.  Background from daughter and previous admission  Lives With: Daughter   Functional History: Prior Function Level of Independence: Independent with assistive device(s) Gait / Transfers Assistance Needed: pt ambulated with use of SPC ADL's / Homemaking Assistance Needed: Pt min A since previous CVA discharge Communication / Swallowing Assistance Needed: Ritta Slot- language  Functional Status:  Mobility: Bed Mobility Overal bed mobility: Needs Assistance Bed Mobility: Supine to Sit Supine to sit: Supervision General bed mobility comments: pt comes to EOB, but inattentive to L UE and does not accept L UE facilitation well. Transfers Overall transfer level: Needs assistance Equipment used: Rolling walker (2 wheeled), Straight cane Transfers: Sit to/from Stand Sit to Stand: Min assist General transfer comment: Pt declined at this time.  Ambulation/Gait Ambulation/Gait assistance: Mod assist Ambulation Distance (Feet): 35 Feet (then addt, 35', 30' and 30' with rests in between) Assistive device: Rolling walker (2 wheeled), Straight cane Gait Pattern/deviations: Step-through pattern, Decreased step length - left, Decreased stance time - right, Decreased stance time - left, Decreased stride length General Gait Details: Gait hemiparetic in nature with variable, but decreased advancement of L LE, decreased w/shift to the right.  pt drifts L into the RW which is being over-powered to the R with the stronger R side.  Pt appears resistant to facilitation of "normal" movement Gait velocity: decreased Gait velocity interpretation: Below normal speed for age/gender  ADL: ADL Overall ADL's : Needs assistance/impaired Eating/Feeding: Minimal assistance, With caregiver independent assisting, Sitting Eating/Feeding Details (indicate cue type and reason): Pt is not eating or drinking right now - even food that the family is bringing from home that would normally entice him Grooming: Set up, Sitting,  Wash/dry face Grooming Details (indicate cue type and reason): bed level Upper Body Bathing: Minimal assistance, Bed level Lower Body Bathing: Minimal assistance, Bed level Upper Body Dressing : Minimal assistance, Sitting Lower Body Dressing: Minimal assistance, Sitting/lateral leans Toilet Transfer:  (not attempted this session, STEDY in room) Toilet Transfer Details (indicate cue type and reason): Pt requests male staff assist with bathroom needs Toileting- Clothing Manipulation and Hygiene: Minimal assistance Functional mobility during ADLs:  (Pt declined at this time) General ADL Comments: Culturally Pt prefers male staff to assist with bathing and bathroom needs. OT explained that sometimes we only have male staff available. Pt is very flat, only communicated through wisper and daughter states it is very garbly.  Cognition: Cognition Overall Cognitive Status: Difficult to assess Arousal/Alertness: Awake/alert Orientation Level: Oriented X4 Attention: Focused Focused Attention: Impaired Problem Solving: Impaired Problem Solving Impairment: Functional basic Cognition Arousal/Alertness: Awake/alert Behavior During Therapy: Flat affect Overall Cognitive Status: Difficult to assess Area of Impairment: Following commands Following Commands: Follows one step commands with increased time General Comments: pt either resents taking direction or doesn't see (understand) the importance of following the directions.  It is difficult to discern and daughter not giving insight. Difficult to assess due to: Non-English speaking, Impaired communication  Physical Exam: Blood pressure (!) 164/87, pulse 65, temperature 98.1 F (  36.7 C), temperature source Oral, resp. rate 18, height '5\' 7"'  (1.702 m), weight 65.1 kg (143 lb 8.3 oz), SpO2 100 %. Physical Exam  Nursing note and vitals reviewed. Constitutional: He appears well-developed and well-nourished.  HENT:  Head: Normocephalic and  atraumatic.  Mouth/Throat: Oropharynx is clear and moist.  Eyes: Pupils are equal, round, and reactive to light. Right conjunctiva is injected.  Neck: Normal range of motion. Neck supple.  Cardiovascular: Normal rate and regular rhythm.   Respiratory: Effort normal and breath sounds normal. No stridor. No respiratory distress. He has no wheezes.  GI: Soft. Bowel sounds are normal. He exhibits no distension. There is no tenderness.  Musculoskeletal: He exhibits no edema or tenderness.  Neurological: He is alert.  Flat affect. Does not make eye contact. Limited verbal output--attempted to whisper once but made no attempt to answer questions interpreted by family.  Family reports ability to express basic needs. Moves all 4's but demonstrates weakness on left more than right---however effort is inconsistent. Senses pain on all 4's.   Skin: Skin is warm and dry.  Psychiatric: His affect is blunt. He is withdrawn. He exhibits a depressed mood. He is noncommunicative.    Lab Results Last 48 Hours        Results for orders placed or performed during the hospital encounter of 04/02/17 (from the past 48 hour(s))  Urine Culture     Status: None   Collection Time: 04/03/17  3:41 PM  Result Value Ref Range   Specimen Description URINE, CLEAN CATCH    Special Requests NONE    Culture NO GROWTH    Report Status 04/04/2017 FINAL   Glucose, capillary     Status: None   Collection Time: 04/03/17  4:50 PM  Result Value Ref Range   Glucose-Capillary 81 65 - 99 mg/dL   Comment 1 Notify RN    Comment 2 Document in Chart   Glucose, capillary     Status: Abnormal   Collection Time: 04/03/17  7:14 PM  Result Value Ref Range   Glucose-Capillary 103 (H) 65 - 99 mg/dL   Comment 1 Notify RN    Comment 2 Document in Chart   Glucose, capillary     Status: None   Collection Time: 04/03/17 11:22 PM  Result Value Ref Range   Glucose-Capillary 72 65 - 99 mg/dL   Comment 1 Notify RN      Comment 2 Document in Chart   Glucose, capillary     Status: None   Collection Time: 04/04/17  3:31 AM  Result Value Ref Range   Glucose-Capillary 99 65 - 99 mg/dL   Comment 1 Notify RN    Comment 2 Document in Chart   Comprehensive metabolic panel     Status: Abnormal   Collection Time: 04/04/17  5:16 AM  Result Value Ref Range   Sodium 141 135 - 145 mmol/L   Potassium 3.6 3.5 - 5.1 mmol/L    Comment: DELTA CHECK NOTED   Chloride 111 101 - 111 mmol/L   CO2 18 (L) 22 - 32 mmol/L   Glucose, Bld 79 65 - 99 mg/dL   BUN 22 (H) 6 - 20 mg/dL   Creatinine, Ser 1.23 0.61 - 1.24 mg/dL   Calcium 8.8 (L) 8.9 - 10.3 mg/dL   Total Protein 6.9 6.5 - 8.1 g/dL   Albumin 3.4 (L) 3.5 - 5.0 g/dL   AST 76 (H) 15 - 41 U/L   ALT 67 (H) 17 - 63  U/L   Alkaline Phosphatase 49 38 - 126 U/L   Total Bilirubin 1.7 (H) 0.3 - 1.2 mg/dL   GFR calc non Af Amer 56 (L) >60 mL/min   GFR calc Af Amer >60 >60 mL/min    Comment: (NOTE) The eGFR has been calculated using the CKD EPI equation. This calculation has not been validated in all clinical situations. eGFR's persistently <60 mL/min signify possible Chronic Kidney Disease.    Anion gap 12 5 - 15  CBC     Status: None   Collection Time: 04/04/17  5:16 AM  Result Value Ref Range   WBC 6.4 4.0 - 10.5 K/uL   RBC 4.50 4.22 - 5.81 MIL/uL   Hemoglobin 14.8 13.0 - 17.0 g/dL   HCT 42.2 39.0 - 52.0 %   MCV 93.8 78.0 - 100.0 fL   MCH 32.9 26.0 - 34.0 pg   MCHC 35.1 30.0 - 36.0 g/dL   RDW 12.8 11.5 - 15.5 %   Platelets 161 150 - 400 K/uL  Glucose, capillary     Status: None   Collection Time: 04/04/17  8:09 AM  Result Value Ref Range   Glucose-Capillary 80 65 - 99 mg/dL  Glucose, capillary     Status: Abnormal   Collection Time: 04/04/17 11:30 AM  Result Value Ref Range   Glucose-Capillary 102 (H) 65 - 99 mg/dL   Comment 1 Notify RN    Comment 2 Document in Chart   Glucose, capillary     Status: None    Collection Time: 04/04/17  4:38 PM  Result Value Ref Range   Glucose-Capillary 97 65 - 99 mg/dL  Glucose, capillary     Status: None   Collection Time: 04/04/17  7:13 PM  Result Value Ref Range   Glucose-Capillary 87 65 - 99 mg/dL   Comment 1 Notify RN    Comment 2 Document in Chart   Glucose, capillary     Status: None   Collection Time: 04/04/17 11:22 PM  Result Value Ref Range   Glucose-Capillary 84 65 - 99 mg/dL   Comment 1 Notify RN    Comment 2 Document in Chart   Glucose, capillary     Status: None   Collection Time: 04/05/17  3:22 AM  Result Value Ref Range   Glucose-Capillary 81 65 - 99 mg/dL   Comment 1 Notify RN    Comment 2 Document in Chart   Glucose, capillary     Status: None   Collection Time: 04/05/17  7:55 AM  Result Value Ref Range   Glucose-Capillary 92 65 - 99 mg/dL     Imaging Results (Last 48 hours)  No results found.       Medical Problem List and Plan: 1.  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  DVT Prophylaxis/Anticoagulation: Pharmaceutical: Lovenox 3. Pain Management: N/A 4. Mood: LCSW to follow for evaluation and support.  5. Neuropsych: This patient is not capable of making decisions on his own behalf. 6. Skin/Wound Care: routine pressure relief measures.  7. Fluids/Electrolytes/Nutrition: Encourage po intake. On dysphagia 1, thin liquids.  8. Pyuria:  On Ceftriaxone D # 3/5?  9. Low B 12 levels: Low normal levels supplemented by Im injections.  10. Acute renal failure: Improved with hydration.  11. Dyslipidemia: On Lipitor.   Post Admission Physician Evaluation: 1. Functional deficits secondary  to right MCA  infarct. 2. Patient is admitted to receive collaborative, interdisciplinary care between the physiatrist, rehab nursing staff, and therapy team. 3. Patient's level of medical complexity and substantial therapy needs in  context of that medical necessity cannot be provided at a lesser intensity of care such as a SNF. 4. Patient has experienced substantial functional loss from his/her baseline which was documented above under the "Functional History" and "Functional Status" headings.  Judging by the patient's diagnosis, physical exam, and functional history, the patient has potential for functional progress which will result in measurable gains while on inpatient rehab.  These gains will be of substantial and practical use upon discharge  in facilitating mobility and self-care at the household level. 5. Physiatrist will provide 24 hour management of medical needs as well as oversight of the therapy plan/treatment and provide guidance as appropriate regarding the interaction of the two. 6. The Preadmission Screening has been reviewed and patient status is unchanged unless otherwise stated above. 7. 24 hour rehab nursing will assist with bladder management, bowel management, safety, skin/wound care, disease management, medication administration, pain management and patient education  and help integrate therapy concepts, techniques,education, etc. 8. PT will assess and treat for/with: Lower extremity strength, range of motion, stamina, balance, functional mobility, safety, adaptive techniques and equipment, NMR, family education, ego support.   Goals are: supervision. 9. OT will assess and treat for/with: ADL's, functional mobility, safety, upper extremity strength, adaptive techniques and equipment, NMR, family education, adaptive equipment, .   Goals are: supervision. Therapy may proceed with showering this patient. 10. SLP will assess and treat for/with: cognition, communication, behavior.  Goals are: supervision to mod I. 11. Case Management and Social Worker will assess and treat for psychological issues and discharge planning. 12. Team conference will be held weekly to assess progress toward goals and to determine  barriers to discharge. 13. Patient will receive at least 3 hours of therapy per day at least 5 days per week. 14. ELOS: 7-10 days       15. Prognosis:  excellent     Meredith Staggers, MD, [place] [facility] 04/05/2017  [name], Hershal Coria 04/05/2017

## 2017-04-05 NOTE — Progress Notes (Signed)
Inpatient Rehabilitation  I have spoken with primary caregiver and family will support and encourage participation.  She also states that patient has been angry and depressed since his stroke and loss of independence, updated Dr. Riley KillSwartz.  Pending acute medical clearance will proceed with admitting patient to IP Rehab today.  D[facility]ed with [facility] RNCM.  Please call with questions.   Charlane FerrettiMelissa Flara Storti, M.A., CCC/SLP Admission Coordinator  [facility]  Cell [phone]

## 2017-04-05 NOTE — Progress Notes (Signed)
Kirsteins, Bradley Sparrow, MD Physician Signed Physical Medicine and Rehabilitation  Consult Note Date of Service: 04/04/2017 10:40 AM  Related encounter: ED to Hosp-Admission (Discharged) from 04/02/2017 in [facility]     Expand All Collapse All   [] Hide copied text [] Hover for attribution information      Physical Medicine and Rehabilitation Consult   Reason for Consult: Left inattention, left sided weakness,  mental status changes and speech deficits Referring Physician: Dr. Susie Cassette.    HPI: [name] is a 76 y.o.  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 Patient with history of dizziness and progressive weakness since fall in 2015.  Per records he was discharged to home 7/5 at min assist level and HH therapy (he declined CIR).  He was readmitted on 04/02/17 with mental status changes, left sided weakness, lack of appetite and nonverbal state.  He was found to have UTI and as well as further propagation of acute right frontoparietal infarct. EEG without evidence of seizures. Neurology felt that patient with encephalopathy due to UTI in setting of progression of L-MCA stroke and to continue ASA/Plavix. Therapy evaluations done and CIR recommended for follow up therapy.   Discussed with physical therapy, patient prefers male therapist. Patient tends to rush and does not concentrate on following proper technique. PT feels that with repetition. This would improve.  Has not been talkative since his stroke, according to daughter. This is a big personality change for him   Review of Systems  Unable to perform ROS: Other  Neurological: Positive for dizziness (chronic).  Psychiatric/Behavioral:       History of depression--stopped taking meds 6 months ago.         Past Medical History:  Diagnosis Date  . Diabetes  (HCC)   . Dizziness   . Hypertension          Past Surgical History:  Procedure Laterality Date  . LAPAROSCOPIC APPENDECTOMY  02/21/2012   Procedure: APPENDECTOMY LAPAROSCOPIC;  Surgeon: Cherylynn Ridges, MD;  Location: [facility] OR;  Service: General;  Laterality: N/A;         Family History  Problem Relation Age of Onset  . Diabetes Neg Hx   . Stroke Neg Hx   . Cancer Neg Hx     Social History: Lives with family. Was independent with cane prior to last stroke. Per  reports he smokes 1-2  Cigarettes/day  in the winter. He has never used smokeless tobacco. Per reports he drinks a beer on occasion. He does not drink alcohol or use drugs.    Allergies: No Known Allergies          Medications Prior to Admission  Medication Sig Dispense Refill  . albuterol (PROVENTIL HFA;VENTOLIN HFA) 108 (90 Base) MCG/ACT inhaler Inhale 2 puffs into the lungs every 6 (six) hours as needed for wheezing or shortness of breath.    Marland Kitchen aspirin EC 81 MG tablet Take 81 mg by mouth daily.    Marland Kitchen atorvastatin (LIPITOR) 80 MG tablet Take 1 tablet (80 mg total) by mouth daily at 6 PM. 30 tablet 0  . clopidogrel (PLAVIX) 75 MG tablet Take 1 tablet (75 mg total) by mouth daily. 30 tablet 0  . fluticasone (FLONASE) 50 MCG/ACT nasal spray Place 2 sprays into both nostrils daily as needed for allergies.  3  . glimepiride (AMARYL) 4 MG tablet Take 4  mg by mouth daily with breakfast.    . lisinopril (PRINIVIL,ZESTRIL) 40 MG tablet Take 40 mg by mouth daily.    Marland Kitchen loratadine (CLARITIN) 10 MG tablet Take 10 mg by mouth daily.     Marland Kitchen losartan-hydrochlorothiazide (HYZAAR) 100-12.5 MG tablet Take 1 tablet by mouth daily.    . meclizine (ANTIVERT) 12.5 MG tablet Take 12.5 mg by mouth 3 (three) times daily as needed for dizziness.    . metFORMIN (GLUCOPHAGE) 500 MG tablet Take 500 mg by mouth 2 (two) times daily with a meal.      Home: Home Living Family/patient expects to be discharged to::  Private residence Living Arrangements: Other relatives, Children Available Help at Discharge: Family, Available 24 hours/day Type of Home: House Home Layout: Two level Alternate Level Stairs-Number of Steps: 12 Alternate Level Stairs-Rails: Right Bathroom Shower/Tub: Engineer, manufacturing systems: Standard Home Equipment: [employer] - single point, Bedside commode, Shower seat, Wheelchair - manual, Environmental consultant - 2 wheels Additional Comments: Pt remains very quiet. Background from daughter and previous admission  Lives With: Daughter  Functional History: Prior Function Level of Independence: Independent with assistive device(s) Gait / Transfers Assistance Needed: pt ambulated with use of SPC ADL's / Homemaking Assistance Needed: Pt min A since previous CVA discharge Communication / Swallowing Assistance Needed: [name]- language Functional Status:  Mobility: Bed Mobility Overal bed mobility: Needs Assistance Bed Mobility: Supine to Sit Supine to sit: Supervision General bed mobility comments: Pt would come to the edge of the bed and sit, no assist needed, use of bed rails and HOB elevated Transfers General transfer comment: Pt declined at this time.   ADL: ADL Overall ADL's : Needs assistance/impaired Eating/Feeding: Minimal assistance, With caregiver independent assisting, Sitting Eating/Feeding Details (indicate cue type and reason): Pt is not eating or drinking right now - even food that the family is bringing from home that would normally entice him Grooming: Set up, Sitting, Wash/dry face Grooming Details (indicate cue type and reason): bed level Upper Body Bathing: Minimal assistance, Bed level Lower Body Bathing: Minimal assistance, Bed level Upper Body Dressing : Minimal assistance, Sitting Lower Body Dressing: Minimal assistance, Sitting/lateral leans Toilet Transfer:  (not attempted this session, STEDY in room) Toilet Transfer Details (indicate cue type and reason): Pt  requests male staff assist with bathroom needs Toileting- Clothing Manipulation and Hygiene: Minimal assistance Functional mobility during ADLs:  (Pt declined at this time) General ADL Comments: Culturally Pt prefers male staff to assist with bathing and bathroom needs. OT explained that sometimes we only have male staff available. Pt is very flat, only communicated through wisper and daughter states it is very garbly.  Cognition: Cognition Overall Cognitive Status: Difficult to assess Arousal/Alertness: Awake/alert Orientation Level: Oriented to person, Oriented to place, Disoriented to time Attention: Focused Focused Attention: Impaired Problem Solving: Impaired Problem Solving Impairment: Functional basic Cognition Arousal/Alertness: Awake/alert Behavior During Therapy: Flat affect Overall Cognitive Status: Difficult to assess Difficult to assess due to: Non-English speaking, Impaired communication   Blood pressure 140/76, pulse 70, temperature 98.4 F (36.9 C), temperature source Axillary, resp. rate 20, height 5\' 7"  (1.702 m), weight 65.1 kg (143 lb 8.3 oz), SpO2 99 %. Physical Exam  Nursing note and vitals reviewed. Constitutional: He appears well-developed and well-nourished.  HENT:  Head: Normocephalic and atraumatic.  Eyes: Pupils are equal, round, and reactive to light. Conjunctivae and EOM are normal.  Neck: Normal range of motion. Neck supple.  Cardiovascular: Normal rate and regular rhythm.   Respiratory: Effort normal  and breath sounds normal. No respiratory distress. He has no wheezes.  GI: Soft. Bowel sounds are normal. He exhibits no distension. There is no tenderness.  Musculoskeletal: He exhibits no edema.  Neurological: He is alert.  Kept head down and refused to make eye contact--question cultural component. He does not like male staff per daughter.  Left facial weakness with occasional garbled sounds. Unable to state name. Left inattention with left  sided weakness--question apraxia and sensory deficits.   Skin: Skin is warm and dry.  Psychiatric: His affect is blunt. He is withdrawn. He is noncommunicative.  Motor strength is 5/5 in the right deltoid, biceps, triceps, grip, 3 minus in the left deltoid, biceps, triceps, finger flexors and extensors. Lower extremity strength 5/5 in the right hip flexor, knee extensor, ankle dorsi flexor. 4/5 and left hip flexor, knee extensor, dorsi flexor. Sensation reported as equal to light touch bilateral upper and lower limbs.  Lab Results Last 24 Hours       Results for orders placed or performed during the hospital encounter of 04/02/17 (from the past 24 hour(s))  Glucose, capillary     Status: None   Collection Time: 04/03/17 12:09 PM  Result Value Ref Range   Glucose-Capillary 99 65 - 99 mg/dL   Comment 1 Notify RN    Comment 2 Document in Chart   Glucose, capillary     Status: None   Collection Time: 04/03/17  4:50 PM  Result Value Ref Range   Glucose-Capillary 81 65 - 99 mg/dL   Comment 1 Notify RN    Comment 2 Document in Chart   Glucose, capillary     Status: Abnormal   Collection Time: 04/03/17  7:14 PM  Result Value Ref Range   Glucose-Capillary 103 (H) 65 - 99 mg/dL   Comment 1 Notify RN    Comment 2 Document in Chart   Glucose, capillary     Status: None   Collection Time: 04/03/17 11:22 PM  Result Value Ref Range   Glucose-Capillary 72 65 - 99 mg/dL   Comment 1 Notify RN    Comment 2 Document in Chart   Glucose, capillary     Status: None   Collection Time: 04/04/17  3:31 AM  Result Value Ref Range   Glucose-Capillary 99 65 - 99 mg/dL   Comment 1 Notify RN    Comment 2 Document in Chart   Comprehensive metabolic panel     Status: Abnormal   Collection Time: 04/04/17  5:16 AM  Result Value Ref Range   Sodium 141 135 - 145 mmol/L   Potassium 3.6 3.5 - 5.1 mmol/L   Chloride 111 101 - 111 mmol/L   CO2 18 (L) 22 - 32 mmol/L    Glucose, Bld 79 65 - 99 mg/dL   BUN 22 (H) 6 - 20 mg/dL   Creatinine, Ser 1.61 0.61 - 1.24 mg/dL   Calcium 8.8 (L) 8.9 - 10.3 mg/dL   Total Protein 6.9 6.5 - 8.1 g/dL   Albumin 3.4 (L) 3.5 - 5.0 g/dL   AST 76 (H) 15 - 41 U/L   ALT 67 (H) 17 - 63 U/L   Alkaline Phosphatase 49 38 - 126 U/L   Total Bilirubin 1.7 (H) 0.3 - 1.2 mg/dL   GFR calc non Af Amer 56 (L) >60 mL/min   GFR calc Af Amer >60 >60 mL/min   Anion gap 12 5 - 15  CBC     Status: None   Collection Time:  04/04/17  5:16 AM  Result Value Ref Range   WBC 6.4 4.0 - 10.5 K/uL   RBC 4.50 4.22 - 5.81 MIL/uL   Hemoglobin 14.8 13.0 - 17.0 g/dL   HCT 16.142.2 09.639.0 - 04.552.0 %   MCV 93.8 78.0 - 100.0 fL   MCH 32.9 26.0 - 34.0 pg   MCHC 35.1 30.0 - 36.0 g/dL   RDW 40.912.8 81.111.5 - 91.415.5 %   Platelets 161 150 - 400 K/uL  Glucose, capillary     Status: None   Collection Time: 04/04/17  8:09 AM  Result Value Ref Range   Glucose-Capillary 80 65 - 99 mg/dL      Imaging Results (Last 48 hours)  Ct Head Wo Contrast  Result Date: 04/02/2017 CLINICAL DATA:  76 y/o  M; altered mental status. EXAM: CT HEAD WITHOUT CONTRAST TECHNIQUE: Contiguous axial images were obtained from the base of the skull through the vertex without intravenous contrast. COMPARISON:  03/27/2017 MRI head.  03/26/2017 CT head. FINDINGS: Brain: No evidence of acute infarction, hemorrhage, hydrocephalus, extra-axial collection or mass lesion/mass effect. Moderate chronic microvascular ischemic changes of white matter parenchymal volume loss of the brain. Lucencies in the right MCA watershed distribution corresponding to infarcts on prior MRI noted. Vascular: Calcific atherosclerosis of the carotid siphons. Skull: Normal. Negative for fracture or focal lesion. Sinuses/Orbits: 9 mm left anterior ethmoid osteoma. Visualized paranasal sinuses are otherwise clear. Orbits are unremarkable. Other: None. IMPRESSION: 1. No acute intracranial abnormality identified. 2.  Stable moderate chronic microvascular ischemic changes and moderate parenchymal volume loss of the brain. 3. Small lucencies corresponding to right MCA distribution foci of infarction on prior MRI. Electronically Signed   By: Mitzi HansenLance  Furusawa-Stratton M.D.   On: 04/02/2017 15:23   Mr Brain Wo Contrast  Result Date: 04/02/2017 CLINICAL DATA:  Altered mental status, now nonverbal. History of stroke and LEFT-sided deficits. History of M1 occlusion, hypertension and diabetes. EXAM: MRI HEAD WITHOUT CONTRAST TECHNIQUE: Single axial diffusion weighted sequence. Patient was unable to tolerate further imaging in attempted to extract cell from scanner. COMPARISON:  CT HEAD April 02, 2017 and MRI of the head March 27, 2017 FINDINGS: Brain: Patchy reduced diffusion RIGHT frontoparietal lobes, further propagation from prior examination with low ADC values. No midline shift, or mass effect. No hydrocephalus. No abnormal extra-axial fluid collections. Vascular: Not assessed Skull and upper cervical spine: Not assessed Sinuses/Orbits: Not assessed Other: Not applicable IMPRESSION: Limited single sequence MRI of the head: Further propagation of acute RIGHT frontoparietal/MCA territory infarct. Electronically Signed   By: Awilda Metroourtnay  Bloomer M.D.   On: 04/02/2017 21:48   Dg Chest Portable 1 View  Result Date: 04/02/2017 CLINICAL DATA:  76 year old male with a history of altered mental status EXAM: PORTABLE CHEST 1 VIEW COMPARISON:  03/27/2017 FINDINGS: Cardiomediastinal silhouette unchanged in size and contour. No pneumothorax or pleural effusion. No confluent airspace disease. Coarsened interstitial markings similar to prior. No displaced fracture IMPRESSION: No radiographic evidence of acute cardiopulmonary disease Electronically Signed   By: Gilmer MorJaime  Wagner D.O.   On: 04/02/2017 15:31     Assessment/Plan: Diagnosis: Right MCA distribution infarct with left hemiparesis and cognitive deficits 1. Does the need for close,  24 hr/day medical supervision in concert with the patient's rehab needs make it unreasonable for this patient to be served in a less intensive setting? Yes 2. Co-Morbidities requiring supervision/potential complications: Diabetes, depression, hypertension 3. Due to bladder management, bowel management, safety, skin/wound care, disease management, medication administration and patient education, does  the patient require 24 hr/day rehab nursing? Yes 4. Does the patient require coordinated care of a physician, rehab nurse, PT (1-2 hrs/day, 5 days/week), OT (1-2 hrs/day, 5 days/week) and SLP (.5-1 hrs/day, 5 days/week) to address physical and functional deficits in the context of the above medical diagnosis(es)? Yes Addressing deficits in the following areas: balance, endurance, locomotion, strength, transferring, bowel/bladder control, bathing, dressing, feeding, grooming, toileting, cognition, speech, language and psychosocial support 5. Can the patient actively participate in an intensive therapy program of at least 3 hrs of therapy per day at least 5 days per week? Yes 6. The potential for patient to make measurable gains while on inpatient rehab is good 7. Anticipated functional outcomes upon discharge from inpatient rehab are modified independent and supervision  with PT, modified independent and supervision with OT, modified independent and supervision with SLP. 8. Estimated rehab length of stay to reach the above functional goals is: 10-12d 9. Anticipated D/C setting: Home 10. Anticipated post D/C treatments: HH therapy 11. Overall Rehab/Functional Prognosis: excellent  RECOMMENDATIONS: This patient's condition is appropriate for continued rehabilitative care in the following setting: CIR Patient has agreed to participate in recommended program. Potentially Note that insurance prior authorization may be required for reimbursement for recommended care.  CommentJerene Pitch 04/04/2017    Revision History                        Routing History

## 2017-04-06 ENCOUNTER — Inpatient Hospital Stay (HOSPITAL_COMMUNITY): Payer: Medicaid Other | Admitting: Speech Pathology

## 2017-04-06 ENCOUNTER — Inpatient Hospital Stay (HOSPITAL_COMMUNITY): Payer: Medicaid Other | Admitting: Physical Therapy

## 2017-04-06 ENCOUNTER — Inpatient Hospital Stay (HOSPITAL_COMMUNITY): Payer: Medicaid Other

## 2017-04-06 DIAGNOSIS — I63511 Cerebral infarction due to unspecified occlusion or stenosis of right middle cerebral artery: Secondary | ICD-10-CM

## 2017-04-06 DIAGNOSIS — G8194 Hemiplegia, unspecified affecting left nondominant side: Secondary | ICD-10-CM

## 2017-04-06 LAB — CBC WITH DIFFERENTIAL/PLATELET
Basophils Absolute: 0 10*3/uL (ref 0.0–0.1)
Basophils Relative: 1 %
Eosinophils Absolute: 0.2 10*3/uL (ref 0.0–0.7)
Eosinophils Relative: 3 %
HCT: 42 % (ref 39.0–52.0)
Hemoglobin: 14.9 g/dL (ref 13.0–17.0)
Lymphocytes Relative: 21 %
Lymphs Abs: 1.1 10*3/uL (ref 0.7–4.0)
MCH: 32.7 pg (ref 26.0–34.0)
MCHC: 35.5 g/dL (ref 30.0–36.0)
MCV: 92.3 fL (ref 78.0–100.0)
Monocytes Absolute: 0.9 10*3/uL (ref 0.1–1.0)
Monocytes Relative: 16 %
Neutro Abs: 3.3 10*3/uL (ref 1.7–7.7)
Neutrophils Relative %: 59 %
Platelets: 135 10*3/uL — ABNORMAL LOW (ref 150–400)
RBC: 4.55 MIL/uL (ref 4.22–5.81)
RDW: 12.6 % (ref 11.5–15.5)
WBC: 5.5 10*3/uL (ref 4.0–10.5)

## 2017-04-06 LAB — COMPREHENSIVE METABOLIC PANEL
ALK PHOS: 52 U/L (ref 38–126)
ALT: 61 U/L (ref 17–63)
AST: 67 U/L — AB (ref 15–41)
Albumin: 3.4 g/dL — ABNORMAL LOW (ref 3.5–5.0)
Anion gap: 12 (ref 5–15)
BILIRUBIN TOTAL: 1.9 mg/dL — AB (ref 0.3–1.2)
BUN: 12 mg/dL (ref 6–20)
CALCIUM: 9 mg/dL (ref 8.9–10.3)
CHLORIDE: 106 mmol/L (ref 101–111)
CO2: 22 mmol/L (ref 22–32)
CREATININE: 1.06 mg/dL (ref 0.61–1.24)
GFR calc Af Amer: >60 mL/min (ref >60)
Glucose, Bld: 89 mg/dL (ref 65–99)
Potassium: 3.5 mmol/L (ref 3.5–5.1)
Sodium: 140 mmol/L (ref 135–145)
Total Protein: 7 g/dL (ref 6.5–8.1)

## 2017-04-06 LAB — GLUCOSE, CAPILLARY
GLUCOSE-CAPILLARY: 91 mg/dL (ref 65–99)
Glucose-Capillary: 116 mg/dL — ABNORMAL HIGH (ref 65–99)
Glucose-Capillary: 92 mg/dL (ref 65–99)
Glucose-Capillary: 74 mg/dL (ref 65–99)

## 2017-04-06 NOTE — Progress Notes (Signed)
[name] is a 76 y.o. male 05/15/1941 [id number]  Subjective: Family reports no new problems - continued dizzy. Slept ok  Objective: Vital signs in last 24 hours: Temp:  [98.2 F (36.8 C)-98.3 F (36.8 C)] 98.3 F (36.8 C) (07/13 1740) Pulse Rate:  [74-78] 78 (07/13 1740) Resp:  [18-19] 18 (07/13 1740) BP: (145-154)/(88-94) 154/94 (07/13 1740) SpO2:  [98 %-99 %] 98 % (07/13 1740) Weight:  [65 kg (143 lb 4.8 oz)] 65 kg (143 lb 4.8 oz) (07/13 1740) Weight change:  Last BM Date: 04/04/17 (per 5C report)  Intake/Output from previous day: 07/13 0701 - 07/14 0700 In: 120 [P.O.:120] Out: -   Physical Exam General: No apparent distress   Sleeping initially, easily aroused, Family in room [facility] Lungs: Normal effort. Lungs clear to auscultation, no crackles or wheezes. Cardiovascular: Regular rate and rhythm, no edema Neurological: L HP, No new neurological deficits   Lab Results: BMET    Component Value Date/Time   NA 140 04/06/2017 0632   K 3.5 04/06/2017 0632   CL 106 04/06/2017 0632   CO2 22 04/06/2017 0632   GLUCOSE 89 04/06/2017 0632   BUN 12 04/06/2017 0632   CREATININE 1.06 04/06/2017 0632   CALCIUM 9.0 04/06/2017 0632   GFRNONAA >60 04/06/2017 0632   GFRAA >60 04/06/2017 0632   CBC    Component Value Date/Time   WBC 5.5 04/06/2017 0632   RBC 4.55 04/06/2017 0632   HGB 14.9 04/06/2017 0632   HCT 42.0 04/06/2017 0632   PLT 135 (L) 04/06/2017 0632   MCV 92.3 04/06/2017 0632   MCH 32.7 04/06/2017 0632   MCHC 35.5 04/06/2017 0632   RDW 12.6 04/06/2017 0632   LYMPHSABS 1.1 04/06/2017 0632   MONOABS 0.9 04/06/2017 0632   EOSABS 0.2 04/06/2017 0632   BASOSABS 0.0 04/06/2017 0632   CBG's (last 3):   Recent Labs  04/05/17 1606 04/05/17 2106 04/06/17 0636  GLUCAP 84 97 91   LFT's Lab Results  Component Value Date   ALT 61 04/06/2017   AST 67 (H) 04/06/2017   ALKPHOS 52 04/06/2017   BILITOT 1.9 (H) 04/06/2017    Studies/Results: No results  found.  Medications:  I have reviewed the patient's current medications. Scheduled Medications: . aspirin EC  81 mg Oral Daily  . atorvastatin  80 mg Oral Daily  . clopidogrel  75 mg Oral Daily  . enoxaparin (LOVENOX) injection  40 mg Subcutaneous Q24H  . glimepiride  2 mg Oral Q breakfast  . loratadine  10 mg Oral Daily   PRN Medications: acetaminophen, albuterol, alum & mag hydroxide-simeth, bisacodyl, diphenhydrAMINE, fluticasone, guaiFENesin-dextromethorphan, meclizine, polyethylene glycol, prochlorperazine **OR** prochlorperazine **OR** prochlorperazine, sodium phosphate, traZODone  Assessment/Plan: Active Problems:   Acute ischemic right MCA stroke (HCC)   Left hemiparesis (HCC)   Oropharyngeal dysphagia   1. Progressive R MCA CVA with L HP, dysphagia, R gaze pref, chronic dizziness - continue CIR therapies and support medical mgmt of co morbidities - on antiplt (ASA + plavix) 2. HTN -on Hyzaar PTA - holding losart 100 and HCTZ 12.5 since extension of CVA territory s/p DC home 7/5 - will monitor BP and resume as indicated 3. DM2 - on Amaryl as PTA - as SSI as needed but cbgs well controlled  Length of stay, days: 1   Tieler Cournoyer A. Felicity CoyerLeschber, MD 04/06/2017, 11:08 AM

## 2017-04-06 NOTE — Evaluation (Signed)
Occupational Therapy Assessment and Plan  Patient Details  Name: Breaker Springer MRN: [id number] Date of Birth: 1940-12-02  OT Diagnosis: abnormal posture, cognitive deficits, disturbance of vision, hemiplegia affecting dominant side, muscle weakness (generalized), other lymphedema and coordination disorder Rehab Potential: Rehab Potential (ACUTE ONLY): Fair ELOS: 7-10   Today's Date: 04/06/2017 OT Individual Time: 0277-4128 OT Individual Time Calculation (min): 75 min     Problem List:  Patient Active Problem List   Diagnosis Date Noted  . Acute ischemic right MCA stroke ([place]) 04/05/2017  . Left hemiparesis ([place])   . Oropharyngeal dysphagia   . Confusion   . Altered mental state   . CVA (cerebral vascular accident) ([place]) 04/02/2017  . AKI (acute kidney injury) ([place]) 04/02/2017  . DM (diabetes mellitus) ([place]) 04/02/2017  . Elevated transaminase level 04/02/2017  . 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 Diabetic complication ([place]) 78/67/6720  . Dizziness 03/26/2017  . Postop check 03/11/2012  . HYDROCELE, RIGHT 08/28/2010  . NOCTURIA 08/28/2010  . OTH&UNSPEC NONINFECTIOUS GASTROENTERITIS&COLITIS 06/29/2010  . ABDOMINAL PAIN-RLQ 06/29/2010  . ABNORMAL FINDINGS GI TRACT 06/29/2010  . HYPERTENSION, BENIGN ESSENTIAL 05/02/2010  . LEUKOCYTOSIS 04/20/2010  . COLITIS, HX OF 04/20/2010    Past Medical History:  Past Medical History:  Diagnosis Date  . Diabetes ([place])   . Dizziness   . Hypertension    Past Surgical History:  Past Surgical History:  Procedure Laterality Date  . LAPAROSCOPIC APPENDECTOMY  02/21/2012   Procedure: APPENDECTOMY LAPAROSCOPIC;  Surgeon: Gwenyth Ober, MD;  Location: [place];  Service: General;  Laterality: N/A;    Assessment & Plan Clinical Impression: Izik Gurungis a 76 y.o.left handed male from [place] with history of T2DM, depression, dizziness, recent right frontoparietal/MCA infarct 03/27/17 with right gaze preference and left  inattention. Patient with history of dizziness and progressive weakness since fall in 2015. Per records he was discharged to home 7/5 at min assist level and HH therapy (he declined CIR). He was readmitted on 04/02/17 with mental status changes, left sided weakness, lack of appetite and nonverbal state. He was found to have UTI, acute renal failure with SCr 2.04and as well as further propagation of acute right frontoparietal infarct. EEG without evidence of seizures. Neurology felt that patient with encephalopathy due to UTI in setting of progression of L-MCA stroke and to continue ASA/Plavix. Therapy evaluations done and CIR recommended for follow up therapy  Patient currently requires supervision- min with basic self-care skills secondary to muscle weakness, decreased cardiorespiratoy endurance, impaired timing and sequencing, motor apraxia and decreased coordination, decreased visual perceptual skills and field cut, decreased midline orientation and decreased attention to left and decreased initiation, decreased attention, decreased awareness, decreased problem solving, decreased safety awareness, decreased memory and delayed processing.  Prior to hospitalization, patient could complete BADLs with min.  Patient will benefit from skilled intervention to increase independence with basic self-care skills prior to discharge home with care partner.  Anticipate patient will require 24 hour supervision and follow up home health.  OT - End of Session Endurance Deficit: Yes OT Assessment Rehab Potential (ACUTE ONLY): Fair OT Barriers to Discharge: Other (comments) OT Barriers to Discharge Comments: Pt mood/depression may impact participation along with pt preferance to not have hands on therapy to facilitate normal movement OT Patient demonstrates impairments in the following area(s): Balance;Behavior;Cognition;Endurance;Motor;Perception;Safety;Sensory;Vision OT Basic ADL's Functional Problem(s):  Grooming;Bathing;Dressing;Toileting;Eating OT Transfers Functional Problem(s): Toilet;Tub/Shower OT Additional Impairment(s): Fuctional Use of Upper Extremity OT Plan OT Intensity: Minimum of 1-2 x/day, 45  to 90 minutes OT Frequency: 5 out of 7 days OT Duration/Estimated Length of Stay: 7-10 OT Treatment/Interventions: Balance/vestibular training;Cognitive remediation/compensation;Discharge planning;Disease mangement/prevention;DME/adaptive equipment instruction;Functional mobility training;Neuromuscular re-education;Pain management;Patient/family education;Psychosocial support;Self Care/advanced ADL retraining;Therapeutic Activities;Therapeutic Exercise;UE/LE Strength taining/ROM;UE/LE Coordination activities;Visual/perceptual remediation/compensation OT Self Feeding Anticipated Outcome(s): S OT Basic Self-Care Anticipated Outcome(s): S OT Toileting Anticipated Outcome(s): S OT Bathroom Transfers Anticipated Outcome(s): S OT Recommendation Patient destination: Home Follow Up Recommendations: 24 hour supervision/assistance;Home health OT Equipment Recommended: To be determined (likely none as pt has TTB and BSC)   Skilled Therapeutic Intervention 1:1. Daughter present throughout session to assist with translating when not in room. While in room, used phone interpreter, however phone line disconnected prior to obtaining ID number. Pt uncomfortable with women therapists helping bathe, dress and toilet therefore, simulation using paper pull over clothes and bathing was utilized. Pt dons pull over shirt with supervision and VC for hemi techniques. Pt able to button/unbutton shirt wearing with increased time 2/2 decreased [place] of LUE. Pt threads BLE in seated figure 4 and completes sit to stand with supervision to min A when advancing pants past hips. Pt stand pivot transfer EOB<>BSC and w/c with VC for [facility] management and safety awareness as pt attempts to sit prior to backing up all the way and almost  misses w/c. OT propels w/c with total A for time management to tub room and demonstrates how to transfer safely onto TTB. Pt attempts to transfer by stepping over ledge and requires min A and step by step VC to sequence tub transfer. Pt simulates bathing seated on TTB with touching A to stand to "wash" buttocks/peri area. Pt requires significantly increased time to process/initate movement throughout session. Exited session with pt supine in bed, call light in reach and bed exit alarm on.   OT Evaluation Precautions/Restrictions  Precautions Precautions: Fall Precaution Comments: left inattention Restrictions Weight Bearing Restrictions: No General Chart Reviewed: Yes Vital Signs  Pain Pain Assessment Pain Assessment: No/denies pain Home Living/Prior Functioning Home Living Available Help at Discharge: Family, Available 24 hours/day Type of Home: House Home Access: Stairs to enter Home Layout: Two level Alternate Level Stairs-Number of Steps: 12 Alternate Level Stairs-Rails: Left Bathroom Shower/Tub: Chiropodist: Standard Additional Comments: Pt remains very quiet. Background from daughter   Lives With: Daughter Prior Function Level of Independence: Requires assistive device for independence  Able to Take Stairs?: Yes Driving: No Vocation: Retired ADL   Vision Baseline Vision/History: Wears glasses Wears Glasses: At all times Vision Assessment?: Vision impaired- to be further tested in functional context;Yes Eye Alignment: Within Functional Limits Ocular Range of Motion: Impaired-to be further tested in functional context;Restricted on the left Alignment/Gaze Preference: Gaze right Tracking/Visual Pursuits: Requires cues, head turns, or add eye shifts to track;Impaired - to be further tested in functional context Visual Fields: Left visual field deficit;Impaired-to be further tested in functional context Additional Comments: Vision not formally tested  as pt not following commands. Difficult to tell from cognitive, behavioral or language barrier Perception  Perception: Impaired (L inattention) Praxis Praxis: Impaired Cognition Overall Cognitive Status: Impaired/Different from baseline (using telephone interpreter ) Arousal/Alertness: Awake/alert Orientation Level: Person;Place Year: 2018 Month: July Day of Week: Incorrect Memory: Impaired Memory Impairment: Decreased recall of new information Immediate Memory Recall: Sock;Blue;Bed Attention: Sustained Focused Attention: Appears intact Sustained Attention: Appears intact Awareness: Impaired Awareness Impairment: Emergent impairment Problem Solving: Impaired Problem Solving Impairment: Functional complex Safety/Judgment: Impaired Sensation Sensation Light Touch: Impaired by gross assessment Proprioception: Impaired by gross assessment  Additional Comments: poor awareness of L  Coordination Gross Motor Movements are Fluid and Coordinated: No Fine Motor Movements are Fluid and Coordinated: No Coordination and Movement Description: decreased amplitude of movements on the L Motor  Motor Motor: Hemiplegia Motor - Skilled Clinical Observations: L sided Hemiplegia UE>LE Mobility  Transfers Sit to Stand: 4: Min assist Sit to Stand Details: Verbal cues for technique;Verbal cues for precautions/safety;Verbal cues for sequencing  Trunk/Postural Assessment  Cervical Assessment Cervical Assessment: Exceptions to [facility] (R gaze) Thoracic Assessment Thoracic Assessment: Within Functional Limits Lumbar Assessment Lumbar Assessment: Within Functional Limits Postural Control Postural Control: Deficits on evaluation (delayed)  Balance Balance Balance Assessed: Yes Dynamic Sitting Balance Dynamic Sitting - Balance Support: No upper extremity supported Dynamic Sitting - Level of Assistance: 5: Stand by assistance Sitting balance - Comments: sitting EOB Static Standing Balance Static  Standing - Balance Support: Right upper extremity supported Static Standing - Level of Assistance: 5: Stand by assistance Static Standing - Comment/# of Minutes: unilateral support on [facility] Dynamic Standing Balance Dynamic Standing - Balance Support: Right upper extremity supported Dynamic Standing - Level of Assistance: 4: Min assist Dynamic Standing - Comments: L lateral LOB to the L wth funcitonal activities and only 1 UE support  Extremity/Trunk Assessment RUE Assessment RUE Assessment: Within Functional Limits LUE Assessment LUE Assessment: Exceptions to WFL (mod AROM; generalized weakness)   See Function Navigator for Current Functional Status.   Refer to Care Plan for Long Term Goals  Recommendations for other services: None    Discharge Criteria: Patient will be discharged from OT if patient refuses treatment 3 consecutive times without medical reason, if treatment goals not met, if there is a change in medical status, if patient makes no progress towards goals or if patient is discharged from hospital.  The above assessment, treatment plan, treatment alternatives and goals were discussed and mutually agreed upon: by patient and by family  Tonny Branch 04/06/2017, 3:02 PM

## 2017-04-06 NOTE — Evaluation (Signed)
Physical Therapy Assessment and Plan  Patient Details  Name: [name] MRN: [id number] Date of Birth: 10-19-1940  PT Diagnosis: Abnormality of gait, Hemiplegia dominant and Muscle weakness Rehab Potential: Good ELOS: 7-10 days    Today's Date: 04/06/2017 PT Individual Time: 1000-1100 PT Individual Time Calculation (min): 60 min    Problem List:  Patient Active Problem List   Diagnosis Date Noted  . Acute ischemic right MCA stroke ([place]) 04/05/2017  . Left hemiparesis ([place])   . Oropharyngeal dysphagia   . Confusion   . Altered mental state   . CVA (cerebral vascular accident) ([place]) 04/02/2017  . AKI (acute kidney injury) ([place]) 04/02/2017  . DM (diabetes mellitus) ([place]) 04/02/2017  . Elevated transaminase level 04/02/2017  . 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 Diabetic complication ([place]) 32/67/1245  . Dizziness 03/26/2017  . Postop check 03/11/2012  . HYDROCELE, RIGHT 08/28/2010  . NOCTURIA 08/28/2010  . OTH&UNSPEC NONINFECTIOUS GASTROENTERITIS&COLITIS 06/29/2010  . ABDOMINAL PAIN-RLQ 06/29/2010  . ABNORMAL FINDINGS GI TRACT 06/29/2010  . HYPERTENSION, BENIGN ESSENTIAL 05/02/2010  . LEUKOCYTOSIS 04/20/2010  . COLITIS, HX OF 04/20/2010    Past Medical History:  Past Medical History:  Diagnosis Date  . Diabetes ([place])   . Dizziness   . Hypertension    Past Surgical History:  Past Surgical History:  Procedure Laterality Date  . LAPAROSCOPIC APPENDECTOMY  02/21/2012   Procedure: APPENDECTOMY LAPAROSCOPIC;  Surgeon: Gwenyth Ober, MD;  Location: [place];  Service: General;  Laterality: N/A;    Assessment & Plan Clinical Impression: 76 y.o.left handed male from [place] with history of T2DM, depression, dizziness, recent right frontoparietal/MCA infarct 03/27/17 with right gaze preference and left inattention. Patient with history of dizziness and progressive weakness since fall in 2015. Per records he was discharged to home 7/5 at min assist level and  HH therapy (he declined CIR). He was readmitted on 04/02/17 with mental status changes, left sided weakness, lack of appetite and nonverbal state. He was found to have UTI, acute renal failure with SCr 2.04and as well as further propagation of acute right frontoparietal infarct. EEG without evidence of seizures. Neurology felt that patient with encephalopathy due to UTI in setting of progression of L-MCA stroke and to continue ASA/Plavix.  Patient transferred to CIR on 04/05/2017 .   Patient currently requires min with mobility secondary to muscle weakness, unbalanced muscle activation and decreased coordination, decreased attention to left, decreased attention, decreased awareness, decreased problem solving, decreased safety awareness and decreased memory and decreased standing balance, decreased postural control, hemiplegia and decreased balance strategies.  Prior to hospitalization, patient was modified independent  with mobility and lived with Daughter in a House home.  Home access is  Stairs to enter.  Patient will benefit from skilled PT intervention to maximize safe functional mobility, minimize fall risk and decrease caregiver burden for planned discharge home with 24 hour supervision.  Anticipate patient will benefit from follow up [place] at discharge.  PT - End of Session Activity Tolerance: Tolerates 10 - 20 min activity with multiple rests Endurance Deficit: Yes PT Assessment Rehab Potential (ACUTE/IP ONLY): Good PT Barriers to Discharge: Inaccessible home environment;Decreased caregiver support;Home environment access/layout PT Patient demonstrates impairments in the following area(s): Balance;Behavior;Endurance;Motor;Nutrition;Pain;Perception;Safety;Sensory;Skin Integrity PT Transfers Functional Problem(s): Bed Mobility;Bed to Chair;Car;Furniture;Floor PT Locomotion Functional Problem(s): Ambulation;Wheelchair Mobility;Stairs PT Plan PT Intensity: Minimum of 1-2 x/day ,45 to 90 minutes PT  Frequency: 5 out of 7 days PT Duration Estimated Length of Stay: 7-10 days  PT Treatment/Interventions:  Ambulation/gait training;Balance/vestibular training;Cognitive remediation/compensation;Discharge planning;Disease management/prevention;DME/adaptive equipment instruction;Community reintegration;Functional electrical stimulation;Functional mobility training;Neuromuscular re-education;Pain management;Patient/family education;Psychosocial support;Skin care/wound management;Splinting/orthotics;Therapeutic Activities;Therapeutic Exercise;Stair training;UE/LE Strength taining/ROM;UE/LE Coordination activities;Visual/perceptual remediation/compensation;Wheelchair propulsion/positioning PT Transfers Anticipated Outcome(s): Supervision assist and LRAD  PT Locomotion Anticipated Outcome(s): Supervision Assist with LRAD  PT Recommendation Follow Up Recommendations: Home health PT Equipment Recommended: Rolling walker with 5" wheels;To be determined  Skilled Therapeutic Intervention PT instructed patient in PT Evaluation and initiated treatment intervention; see below for results. PT educated patient in [place], rehab potential, rehab goals, and discharge recommendations. Additional gait training with RW x 33f with Min assist to control RW and prevent hitting obstacles on the L. Car transfer with min assist using SPC. Pt returned to room and performed stand pivot transfer to bed with supervision assist using SPC. Sit>supine completed with min assist and increased time. Pt  left supine in bed with call bell in reach and all needs met.     PT Evaluation Precautions/Restrictions   Fall. L inattention.  General   Vital Signs Pain   faces: none.  Home Living/Prior Functioning Home Living Available Help at Discharge: Family;Available 24 hours/day Type of Home: House Home Access: Stairs to enter Home Layout: Two level Alternate Level Stairs-Number of Steps: 12 Alternate Level Stairs-Rails: Left Bathroom  Shower/Tub: TChiropodist Standard Additional Comments: Pt remains very quiet. Background from daughter   Lives With: Daughter Prior Function Level of Independence: Requires assistive device for independence  Able to Take Stairs?: Yes Driving: No Vocation: Retired Vision/Perception    L inattention. possible L field cut.  Gaze preference R.  Cognition Overall Cognitive Status: Impaired/Different from baseline Orientation Level: Oriented to person;Oriented to place Focused Attention: Impaired Memory: Impaired Awareness: Impaired Awareness Impairment: Emergent impairment Problem Solving: Impaired Problem Solving Impairment: Functional complex Safety/Judgment: Impaired Sensation Sensation Light Touch: Impaired by gross assessment Proprioception: Impaired by gross assessment Additional Comments: poor awareness of L sided.  Coordination Gross Motor Movements are Fluid and Coordinated: No Fine Motor Movements are Fluid and Coordinated: No Coordination and Movement Description: decreased amplitude of movements on the L Motor  Motor Motor: Hemiplegia Motor - Skilled Clinical Observations: L sided Hemiplegia UE>LE  Mobility Bed Mobility Bed Mobility: Rolling Right;Rolling Left;Sit to Supine;Supine to Sit Rolling Right: 5: Supervision Rolling Left: 4: Min assist Supine to Sit: 5: Supervision Sit to Supine: 4: Min assist Sit to Supine - Details: Verbal cues for precautions/safety Transfers Transfers: Yes Sit to Stand: 4: Min assist Sit to Stand Details: Verbal cues for technique;Verbal cues for precautions/safety;Verbal cues for sequencing Sit to Stand Details (indicate cue type and reason): no AD Stand Pivot Transfers: 4: Min assist Stand Pivot Transfer Details: Verbal cues for technique;Verbal cues for precautions/safety;Verbal cues for sequencing;Verbal cues for gait pattern Stand Pivot Transfer Details (indicate cue type and reason): no AD Locomotion   Ambulation Ambulation: Yes Ambulation/Gait Assistance: 3: Mod assist Ambulation Distance (Feet): 30 Feet Assistive device: Straight cane Ambulation/Gait Assistance Details: Visual cues/gestures for sequencing;Verbal cues for technique;Verbal cues for precautions/safety;Verbal cues for gait pattern Gait Gait: Yes Gait Pattern: Impaired Gait Pattern: Left steppage;Decreased step length - left Stairs / Additional Locomotion Stairs: Yes Stairs Assistance: 4: Min assist Stairs Assistance Details: Visual cues for safe use of DME/AE;Tactile cues for placement;Verbal cues for precautions/safety;Verbal cues for gait pattern Stair Management Technique: Two rails Number of Stairs: 12 Height of Stairs: 6 (and 3 ) Wheelchair Mobility Wheelchair Mobility: No (pt declined )  Trunk/Postural Assessment  Cervical Assessment Cervical Assessment: Exceptions to  WFL (R gaze preferance) Thoracic Assessment Thoracic Assessment: Within Functional Limits Lumbar Assessment Lumbar Assessment: Within Functional Limits Postural Control Postural Control: Deficits on evaluation (decreased protective responsive in stance)  Balance Balance Balance Assessed: Yes Dynamic Sitting Balance Dynamic Sitting - Balance Support: No upper extremity supported Dynamic Sitting - Level of Assistance: 5: Stand by assistance Static Standing Balance Static Standing - Balance Support: Right upper extremity supported Static Standing - Level of Assistance: 5: Stand by assistance Static Standing - Comment/# of Minutes: 1 UE support on [facility] Dynamic Standing Balance Dynamic Standing - Balance Support: Right upper extremity supported Dynamic Standing - Level of Assistance: 3: Mod assist;4: Min assist Dynamic Standing - Comments: L lateral LOB to the L wth funcitonal activities and only 1 UE support  Extremity Assessment      RLE Assessment RLE Assessment: Within Functional Limits LLE Assessment LLE Assessment: Exceptions to  [facility] LLE Strength LLE Overall Strength Comments: 4+/5 proximal to distal except Hip flexion 4-/5   See Function Navigator for Current Functional Status.   Refer to Care Plan for Long Term Goals  Recommendations for other services: None   Discharge Criteria: Patient will be discharged from PT if patient refuses treatment 3 consecutive times without medical reason, if treatment goals not met, if there is a change in medical status, if patient makes no progress towards goals or if patient is discharged from hospital.  The above assessment, treatment plan, treatment alternatives and goals were discussed and mutually agreed upon: by patient  Lorie Phenix 04/06/2017, 11:41 AM

## 2017-04-06 NOTE — Evaluation (Signed)
Speech Language Pathology Assessment and Plan  Patient Details  Name: [name] MRN: [id number] Date of Birth: [birth year]  SLP Diagnosis: Cognitive Impairments;Dysphagia  Rehab Potential: Fair ELOS: 7 to 10 days    Today's Date: 04/06/2017 SLP Individual Time: 1430-1530 SLP Individual Time Calculation (min): 60 min   Problem List:  Patient Active Problem List   Diagnosis Date Noted  . Acute ischemic right MCA stroke ([place]) 04/05/2017  . Left hemiparesis ([place])   . Oropharyngeal dysphagia   . Confusion   . Altered mental state   . CVA (cerebral vascular accident) ([place]) 04/02/2017  . AKI (acute kidney injury) ([place]) 04/02/2017  . DM (diabetes mellitus) ([place]) 04/02/2017  . Elevated transaminase level 04/02/2017  . 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 Diabetic complication ([place]) 40/98/1191  . Dizziness 03/26/2017  . Postop check 03/11/2012  . HYDROCELE, RIGHT 08/28/2010  . NOCTURIA 08/28/2010  . OTH&UNSPEC NONINFECTIOUS GASTROENTERITIS&COLITIS 06/29/2010  . ABDOMINAL PAIN-RLQ 06/29/2010  . ABNORMAL FINDINGS GI TRACT 06/29/2010  . HYPERTENSION, BENIGN ESSENTIAL 05/02/2010  . LEUKOCYTOSIS 04/20/2010  . COLITIS, HX OF 04/20/2010   Past Medical History:  Past Medical History:  Diagnosis Date  . Diabetes ([place])   . Dizziness   . Hypertension    Past Surgical History:  Past Surgical History:  Procedure Laterality Date  . LAPAROSCOPIC APPENDECTOMY  02/21/2012   Procedure: APPENDECTOMY LAPAROSCOPIC;  Surgeon: Gwenyth Ober, MD;  Location: [facility];  Service: General;  Laterality: N/A;    Assessment / Plan / Recommendation Clinical Impression Bradley Gurungis a 76 y.o.left handed male from [place] with history of T2DM, depression, dizziness, recent right frontoparietal/MCA infarct 03/27/17 with right gaze preference and left inattention. Patient with history of dizziness and progressive weakness since fall in 2015. Per records he was discharged to home 7/5 at  min assist level and HH therapy (he declined CIR). He was readmitted on 04/02/17 with mental status changes, left sided weakness, lack of appetite and nonverbal state. He was found to have UTI, acute renal failure with SCr 2.04and as well as further propagation of acute right frontoparietal infarct. EEG without evidence of seizures. Neurology felt that patient with encephalopathy due to UTI in setting of progression of L-MCA stroke and to continue ASA/Plavix. Therapy evaluations done and CIR recommended for follow up therapy.   Pt admited to CIR on 04/05/17 with informal cognitive and bedside swallow evaluations completed on 04/06/17. Pt's cognitive linguistic ability was informally assessed with daughter's assistance. Pt able to follow directions regarding ADLs with no assistance, he was able to sustain attention to her for completion of task and she was able to understand all that he spoke to her in his native language. She provides that he is much better than several days ago. Pt with difficulty feeding himself d/t left inattention but wth cues form daughter, he was able to locate food items. Pt with decreased awareness of left inattention and is resistive to assistance from daughter to help. Pt with no eye contact with daughter or therapist (perhaps cultural?). Pt is currently on dysphagia 1 diet with thin liquids. However, daughter had mango for pt and he consumed without s/s of aspiration. Pt consumed thin liquids via straw without overt s/s of aspiration. Education provided to daughter on overt s/s of aspiration and she voiced understanding. Pt often refuses facility provided food and pt's daughter supplements. At this time, this appears functional and pt is maintaining with good tolerance. Skilled ST is required to address further diet advancement,  increase safety awareness, target left inattention and further assess pt's ability to problem solve to increase pt independence and decrease caregiver burden.  Prognosis is guarded given pt's historical decreased interaction with therapists.    Skilled Therapeutic Interventions          Skilled treatment session focused on completion of speech-language and bedside swallow evaluations, see above. Education provided to daughter on overt s/s of aspiration and she voiced understanding. Will continue to add cognitive goals as appropriate for supervision level discharge plan.    SLP Assessment  Patient will need skilled Speech Lanaguage Pathology Services during CIR admission    Recommendations  SLP Diet Recommendations: Dysphagia 1 (Puree);Thin (Family to supplement with fruit) Liquid Administration via: Cup;Straw Medication Administration: Crushed with puree Supervision: Intermittent supervision to cue for compensatory strategies;Trained caregiver to feed patient Compensations: Minimize environmental distractions;Slow rate;Small sips/bites Postural Changes and/or Swallow Maneuvers: Seated upright 90 degrees Oral Care Recommendations: Oral care BID Patient destination: Home Follow up Recommendations: 24 hour supervision/assistance Equipment Recommended: None recommended by SLP    SLP Frequency 3 to 5 out of 7 days   SLP Duration  SLP Intensity  SLP Treatment/Interventions 7 to 10 days  Minumum of 1-2 x/day, 30 to 90 minutes  Speech/Language facilitation;Dysphagia/aspiration precaution training;Patient/family education    Pain Pain Assessment Pain Assessment: No/denies pain  Prior Functioning Cognitive/Linguistic Baseline: Within functional limits Type of Home: House  Lives With: Daughter Available Help at Discharge: Family;Available 24 hours/day Vocation: Retired  Function:  Eating Eating   Modified Consistency Diet: Yes Eating Assist Level: More than reasonable amount of time           Cognition Comprehension Comprehension assist level: Understands basic 75 - 89% of the time/ requires cueing 10 - 24% of the time (Follows  basic with daughter)  Expression   Expression assist level: Expresses basic 90% of the time/requires cueing < 10% of the time.;Expresses basic needs/ideas: With extra time/assistive device (Daughter able to understand pt)  Social Interaction Social Interaction assist level: Interacts appropriately less than 25% of the time. May be withdrawn or combative.  Problem Solving Problem solving assist level: Solves basic 25 - 49% of the time - needs direction more than half the time to initiate, plan or complete simple activities (With cues from daughter)  Memory Memory assist level:  (Unable to assess)   Short Term Goals: Week 1: SLP Short Term Goal 1 (Week 1): Pt will consume dysphagia 2 diet textures with min overt s/s of aspiration and supervision cues.  SLP Short Term Goal 2 (Week 1): Pt will scan to left of his environment to locate object in 50% of opportunities with Mod A cues.  SLP Short Term Goal 3 (Week 1): Pt will demonstrate increased safety awareness by identifying safety problems in picture cards with Min A and 50% accuracy.  SLP Short Term Goal 4 (Week 1): Pt will complete basic, familiar problem solving task with Min A.   Refer to Care Plan for Long Term Goals  Recommendations for other services: None   Discharge Criteria: Patient will be discharged from SLP if patient refuses treatment 3 consecutive times without medical reason, if treatment goals not met, if there is a change in medical status, if patient makes no progress towards goals or if patient is discharged from hospital.  The above assessment, treatment plan, treatment alternatives and goals were discussed and mutually agreed upon: by patient and by family  [name] 04/06/2017, 3:09 PM

## 2017-04-06 NOTE — Plan of Care (Signed)
Problem: RH SAFETY Goal: RH STG DECREASED RISK OF FALL WITH ASSISTANCE STG Decreased Risk of Fall With min Assistance.  Outcome: Not Progressing Patient ambulating without calling for assistance

## 2017-04-07 LAB — CULTURE, BLOOD (ROUTINE X 2)
CULTURE: NO GROWTH
Special Requests: ADEQUATE

## 2017-04-07 LAB — GLUCOSE, CAPILLARY: Glucose-Capillary: 158 mg/dL — ABNORMAL HIGH (ref 65–99)

## 2017-04-07 NOTE — Plan of Care (Signed)
Problem: RH SAFETY Goal: RH STG ADHERE TO SAFETY PRECAUTIONS W/ASSISTANCE/DEVICE STG Adhere to Safety Precautions With supervision  Outcome: Not Progressing Patient initiating ambulation without requesting help. Family also not notifying staff.

## 2017-04-07 NOTE — Progress Notes (Signed)
Patient's family verbalizes agreement with protocols like calling if patient gets up or for supervision with meals but does not always follow through with instruction. Only one opportunity to work with assistance from Nurse, learning disabilitytranslator over the weekend. Patient and family need additional education to understand safety precautions for diet and ambulation. Written materials provided about D1 diet to assist with bringing in food that is more culturally appropriate but also within diet guidelines.

## 2017-04-07 NOTE — Progress Notes (Signed)
[name] is a 76 y.o. male 12/16/1940 [id number]  Subjective: Family reports no new problems - patient used RW to guide self to BR last PM, then walked to sink to wash face without device before returning to bed per family - no falls - and reminded family to call for assist if pt OOB  Objective: Vital signs in last 24 hours: Temp:  [97.4 F (36.3 C)-98.3 F (36.8 C)] 98.3 F (36.8 C) (07/15 0550) Pulse Rate:  [66-75] 66 (07/15 0550) Resp:  [18] 18 (07/15 0550) BP: (126-144)/(79-84) 144/79 (07/15 0550) SpO2:  [98 %-99 %] 99 % (07/15 0550) Weight change:  Last BM Date: 04/04/17  Intake/Output from previous day: 07/14 0701 - 07/15 0700 In: 280 [P.O.:280] Out: -   Physical Exam General: No apparent distress  Sleeping. Family in room [facility]) Lungs: Normal effort. Lungs clear to auscultation, no crackles or wheezes. Cardiovascular: Regular rate and rhythm, no edema   Lab Results: BMET    Component Value Date/Time   NA 140 04/06/2017 0632   K 3.5 04/06/2017 0632   CL 106 04/06/2017 0632   CO2 22 04/06/2017 0632   GLUCOSE 89 04/06/2017 0632   BUN 12 04/06/2017 0632   CREATININE 1.06 04/06/2017 0632   CALCIUM 9.0 04/06/2017 0632   GFRNONAA >60 04/06/2017 0632   GFRAA >60 04/06/2017 0632   CBC    Component Value Date/Time   WBC 5.5 04/06/2017 0632   RBC 4.55 04/06/2017 0632   HGB 14.9 04/06/2017 0632   HCT 42.0 04/06/2017 0632   PLT 135 (L) 04/06/2017 0632   MCV 92.3 04/06/2017 0632   MCH 32.7 04/06/2017 0632   MCHC 35.5 04/06/2017 0632   RDW 12.6 04/06/2017 0632   LYMPHSABS 1.1 04/06/2017 0632   MONOABS 0.9 04/06/2017 0632   EOSABS 0.2 04/06/2017 0632   BASOSABS 0.0 04/06/2017 0632   CBG's (last 3):    Recent Labs  04/06/17 1205 04/06/17 1705 04/06/17 2110  GLUCAP 116* 92 74   LFT's Lab Results  Component Value Date   ALT 61 04/06/2017   AST 67 (H) 04/06/2017   ALKPHOS 52 04/06/2017   BILITOT 1.9 (H) 04/06/2017    Studies/Results: No results  found.  Medications:  I have reviewed the patient's current medications. Scheduled Medications: . aspirin EC  81 mg Oral Daily  . atorvastatin  80 mg Oral Daily  . clopidogrel  75 mg Oral Daily  . enoxaparin (LOVENOX) injection  40 mg Subcutaneous Q24H  . glimepiride  2 mg Oral Q breakfast  . loratadine  10 mg Oral Daily   PRN Medications: acetaminophen, albuterol, alum & mag hydroxide-simeth, bisacodyl, diphenhydrAMINE, fluticasone, guaiFENesin-dextromethorphan, meclizine, polyethylene glycol, prochlorperazine **OR** prochlorperazine **OR** prochlorperazine, sodium phosphate, traZODone  Assessment/Plan: Active Problems:   Acute ischemic right MCA stroke ([facility])   Left hemiparesis ([facility])   Oropharyngeal dysphagia   1. Progressive R MCA CVA with L HP, dysphagia, R gaze pref: continue CIR therapies and support medical mgmt of co morbidities - on antiplt (ASA + plavix) 2. HTN -on Hyzaar PTA - holding losart 100 and HCTZ 12.5 since extension of CVA territory s/p DC home 7/5 - will monitor BP and resume as indicated 3. DM2 - on Amaryl as PTA - as SSI as needed but cbgs well controlled  Length of stay, days: 2   Airen Dales A. Felicity CoyerLeschber, MD 04/07/2017, 9:15 AM

## 2017-04-08 ENCOUNTER — Inpatient Hospital Stay (HOSPITAL_COMMUNITY): Payer: Medicaid Other | Admitting: Physical Therapy

## 2017-04-08 ENCOUNTER — Inpatient Hospital Stay (HOSPITAL_COMMUNITY): Payer: Medicaid Other | Admitting: Speech Pathology

## 2017-04-08 ENCOUNTER — Inpatient Hospital Stay (HOSPITAL_COMMUNITY): Payer: Medicaid Other

## 2017-04-08 DIAGNOSIS — F329 Major depressive disorder, single episode, unspecified: Secondary | ICD-10-CM

## 2017-04-08 LAB — GLUCOSE, CAPILLARY
GLUCOSE-CAPILLARY: 131 mg/dL — AB (ref 65–99)
Glucose-Capillary: 112 mg/dL — ABNORMAL HIGH (ref 65–99)

## 2017-04-08 LAB — CULTURE, BLOOD (ROUTINE X 2)
Culture: NO GROWTH
Special Requests: ADEQUATE

## 2017-04-08 MED ORDER — CITALOPRAM HYDROBROMIDE 10 MG PO TABS
10.0000 mg | ORAL_TABLET | Freq: Every day | ORAL | Status: DC
  Administered 2017-04-08 – 2017-04-10 (×3): 10 mg via ORAL
  Filled 2017-04-08 (×3): qty 1

## 2017-04-08 NOTE — Progress Notes (Signed)
[place] PHYSICAL MEDICINE & REHABILITATION     PROGRESS NOTE    Subjective/Complaints: No issues this morning. Did refuse to work with male staff over weekend.   ROS: Limited due to cognitive/behavioral   Objective: Vital Signs: Blood pressure 121/86, pulse 68, temperature 97.8 F (36.6 C), temperature source Oral, resp. rate 18, height 5\' 7"  (1.702 m), weight 65 kg (143 lb 4.8 oz), SpO2 99 %. No results found.  Recent Labs  04/06/17 0632  WBC 5.5  HGB 14.9  HCT 42.0  PLT 135*    Recent Labs  04/06/17 0632  NA 140  K 3.5  CL 106  GLUCOSE 89  BUN 12  CREATININE 1.06  CALCIUM 9.0   CBG (last 3)   Recent Labs  04/06/17 1705 04/06/17 2110 04/07/17 1156  GLUCAP 92 74 158*    Wt Readings from Last 3 Encounters:  04/05/17 65 kg (143 lb 4.8 oz)  04/02/17 65.1 kg (143 lb 8.3 oz)  03/26/17 65.7 kg (144 lb 14.4 oz)    Physical Exam:  Constitutional: He appears well-developedand well-nourished.  HENT:  Head: Normocephalicand atraumatic.  Mouth/Throat: Oropharynx is clear and moist.  Eyes: Pupils are equal, round, and reactive to light. Right conjunctiva is injected.  Neck: Normal range of motion. Neck supple.  Cardiovascular: RRR without murmur. No JVD .  Respiratory: CTA Bilaterally without wheezes or rales. Normal effort  GI: Soft. Bowel sounds are normal. He exhibits no distension. There is no tenderness.  Musculoskeletal: He exhibits no edemaor tenderness.  Neurological: He is alert.  Flat affect. Does not make attempt to communicate.  Moves all 4's but demonstrates weakness on left more than right---however remains inconsistent. Senses pain on all 4's.  Skin: Skin is warmand dry.  Psychiatric: His affect is blunt. He is withdrawn. He exhibits a depressed mood. He is noncommunicative.    Assessment/Plan: 1. Functional deficits and left hemiparesis secondary to right MCA infarct /recent UTI which require 3+ hours per day of interdisciplinary  therapy in a comprehensive inpatient rehab setting. Physiatrist is providing close team supervision and 24 hour management of active medical problems listed below. Physiatrist and rehab team continue to assess barriers to discharge/monitor patient progress toward functional and medical goals.  Function:  Bathing Bathing position Bathing activity did not occur:  (simulated) Position: Shower  Bathing parts Body parts bathed by patient: Right arm, Left arm, Chest, Abdomen, Front perineal area, Buttocks, Right upper leg, Left upper leg, Right lower leg, Left lower leg Body parts bathed by helper: Back  Bathing assist Assist Level: Touching or steadying assistance(Pt > 75%)      Upper Body Dressing/Undressing Upper body dressing   What is the patient wearing?: Pull over shirt/dress     Pull over shirt/dress - Perfomed by patient: Thread/unthread right sleeve, Thread/unthread left sleeve, Put head through opening, Pull shirt over trunk          Upper body assist Assist Level: Supervision or verbal cues      Lower Body Dressing/Undressing Lower body dressing   What is the patient wearing?: Non-skid slipper socks, Pants     Pants- Performed by patient: Thread/unthread right pants leg, Thread/unthread left pants leg, Pull pants up/down   Non-skid slipper socks- Performed by patient: Don/doff right sock, Don/doff left sock                    Lower body assist Assist for lower body dressing: Touching or steadying assistance (Pt > 75%)  Toileting Toileting   Toileting steps completed by patient: Adjust clothing prior to toileting, Performs perineal hygiene, Adjust clothing after toileting      Toileting assist Assist level: Supervision or verbal cues   Transfers Chair/bed transfer   Chair/bed transfer method: Stand pivot Chair/bed transfer assist level: Touching or steadying assistance (Pt > 75%)       Locomotion Ambulation     Max distance: 4730ft  Assist  level: Moderate assist (Pt 50 - 74%)   Wheelchair Wheelchair activity did not occur: Refused        Cognition Comprehension Comprehension assist level: Understands basic 75 - 89% of the time/ requires cueing 10 - 24% of the time  Expression Expression assist level: Expresses basic 75 - 89% of the time/requires cueing 10 - 24% of the time. Needs helper to occlude trach/needs to repeat words.  Social Interaction Social Interaction assist level: Interacts appropriately 50 - 74% of the time - May be physically or verbally inappropriate.  Problem Solving Problem solving assist level: Solves basic 50 - 74% of the time/requires cueing 25 - 49% of the time  Memory Memory assist level: Recognizes or recalls 50 - 74% of the time/requires cueing 25 - 49% of the time   Medical Problem List and Plan: 1. Functional deficits and left hemiparesissecondary to right MCA infarct/recent UTI -continue CIR  -spoke with daughter today regarding need to work with male staff   2. DVT Prophylaxis/Anticoagulation: Pharmaceutical: Lovenox 3. Pain Management: N/A 4. Mood: daughter agrees patient is depressed  -will initiate low dose celexa at hs.  5. Neuropsych: This patient is notcapable of making decisions on hisown behalf. 6. Skin/Wound Care: routine pressure relief measures.  7. Fluids/Electrolytes/Nutrition: Encourage po intake. On dysphagia 1, thin liquids.  8. Pyuria: On Ceftriaxone D # 3/5?  9. Low B 12 levels: Low normal levels supplemented by Im injections.  10. Acute renal failure: Improved with hydration.  11. Dyslipidemia: On Lipitor.   LOS (Days) 3 A FACE TO FACE EVALUATION WAS PERFORMED  Ranelle OysterSWARTZ,Lulia Schriner T, MD 04/08/2017 9:41 AM

## 2017-04-08 NOTE — Progress Notes (Signed)
Patient information reviewed and entered into e[facility]hab system by Maryagnes Carrasco, RN, CRRN, PPS Coordinator.  Information including medical coding and functional independence measure will be reviewed and updated through discharge.    

## 2017-04-08 NOTE — Plan of Care (Signed)
Problem: RH SAFETY Goal: RH STG DEMO UNDERSTANDING HOME SAFETY PRECAUTIONS Outcome: Progressing Reviewed with patient's daughters  Problem: RH COGNITION-NURSING Goal: RH STG USES MEMORY AIDS/STRATEGIES W/ASSIST TO PROBLEM SOLVE STG Uses Memory Aids/Strategies With min Assistance to Problem Solve.  Outcome: Not Applicable Date Met: 85/46/27 Patient nonverbal and unable to assess memory Goal: RH STG ANTICIPATES NEEDS/CALLS FOR ASSIST W/ASSIST/CUES STG Anticipates Needs/Calls for Assist With min cues Assistance/Cues.  Outcome: Not Progressing Patient does not call for assist and RN reinforced to call staff for assistance when transferring   Problem: RH KNOWLEDGE DEFICIT Goal: RH STG INCREASE KNOWLEDGE OF DIABETES Outcome: Progressing Daughter verbalizes understanding Goal: RH STG INCREASE KNOWLEDGE OF HYPERTENSION Outcome: Progressing Daughter verbalizes understanding of hypertension Goal: RH STG INCREASE KNOWLEDGE OF DYSPHAGIA/FLUID INTAKE Outcome: Progressing Daughter verbalizes understanding of dysphasia.

## 2017-04-08 NOTE — Progress Notes (Signed)
Physical Therapy Session Note  Patient Details  Name: [name] MRN: [id number] Date of Birth: 01/11/1941  Today's Date: 04/08/2017 PT Individual Time: 1430-1530 PT Individual Time Calculation (min): 60 min   Short Term Goals: Week 1:  PT Short Term Goal 1 (Week 1): STG=LTG due to ELOS   Skilled Therapeutic Interventions/Progress Updates: Interpreter used throughout session to communicate. Pt received supine in bed, c/o pain in B wrists but does not rate; otherwise agreeable to treatment. Supine>sit with S and HOB elevated. Ambulation with SPC x10' with min/modA with short shuffling gait pattern, mild LOB to L side, L inattention and a general lack of safety awareness. SPC replaced with RW; improved step symmetry and safety, however occasionally veering L within walker, walking very close to or into objects on L side requiring cues for navigation. Engaged pt in dynavision in standing for focus on visual scanning, L attention, and dynamic standing balance. Initial trial performed mode A endurance x4 min with significantly increased reaction time on L hemisphere (12 sec upper quadrant, 8.4 sec lower quadrant) compared to R (7 sec upper quadrant, 4.8 sec lower quadrant). Educated pt and daughter on goal of improving visual scanning and L attention, not necessarily improving vision out of L eye. Second trial with improved reaction speed on L side, improved spontaneous visual scanning to L side, however pt's daughter and interpreter occasionally providing cues to scan to L side and pt rotating body towards the L as compensation. Pt performed stairs 3" and 6" height several trials each to determine best gait pattern to prepare for d/c home; recommend BUE on L rail during ascent (R rail descent) and side stepping up stairs with cues to prevent crossing feet, and allow room for B feet on same step. Initially required modA due to impulsivity and poor L foot placement on step, improved to min guard by final trial  of 12 stairs in a row on 6" height steps to simulate home environment. Will need extensive repetition to master gait pattern for safety at home. Returned to room totalA in w/c. Stand pivot to return to bed with close S. Remained seated on EOB with daughter present at completion of session, all needs in reach.      Therapy Documentation Precautions:  Precautions Precautions: Fall Precaution Comments: left inattention Restrictions Weight Bearing Restrictions: No   See Function Navigator for Current Functional Status.   Therapy/Group: Individual Therapy  Vista Lawmanlizabeth J Tygielski 04/08/2017, 4:02 PM

## 2017-04-08 NOTE — Progress Notes (Signed)
Patient allowed RN to assess him but no verbal response noted from patient to RN. Patient's family answering questions and RN reinforced to family to call staff for transfer assistance. Continue with plan of care.  Cleotilde NeerJoyce, Masae Lukacs S

## 2017-04-08 NOTE — Progress Notes (Signed)
Occupational Therapy Session Note  Patient Details  Name: [name] MRN: [id number] Date of Birth: 12/14/1940  Today's Date: 04/08/2017 OT Individual Time: [id number] OT Individual Time Calculation (min): 60 min    Short Term Goals: Week 1:  OT Short Term Goal 1 (Week 1): STG=LTG 2/2 ELOS  Skilled Therapeutic Interventions/Progress Updates:    Pt resting in bed upon arrival with daughter and interpreter present.  Pt agreeable to therapy.  Pt and daughter stated that pt had already completed bathing/dressing tasks.  Pt's daughter stated that pt did not required any assistance.  Pt does not like any tactile cues/assistance with transfers.  Pt transferred to w/c with supervision and transitioned to therapy gym.  Pt engaged in LUE FMC/gross motor tasks.  Pt with significant L inattention (neglect?).  Pt's daughter stated that pt's vision in L eye was poor since a previous stroke.  Pt also instructed on LLE therex to increase strengthening.  Pt returned to room and remained in w/c with daughter present.  Pt and daughter educated on importance of calling staff for assistance with all transfers.  Pt verbalized understanding.   Therapy Documentation Precautions:  Precautions Precautions: Fall Precaution Comments: left inattention Restrictions Weight Bearing Restrictions: No Pain:  Pt c/o RUE itching after blood draw; RN aware  See Function Navigator for Current Functional Status.   Therapy/Group: Individual Therapy  Rich BraveLanier, Pacen Watford Chappell 04/08/2017, 9:33 AM

## 2017-04-08 NOTE — IPOC Note (Signed)
Overall Plan of Care [facility]) Patient Details Name: [name] MRN: [id number] DOB: December 23, 1940  Admitting Diagnosis: CVA  Hospital Problems: Principal Problem:   Acute ischemic right MCA stroke [facility]) Active Problems:   Left hemiparesis (HCC)   Oropharyngeal dysphagia   Reactive depression     Functional Problem List: Nursing Nutrition, Safety, Sensory, Medication Management, Motor  PT Balance, Behavior, Endurance, Motor, Nutrition, Pain, Perception, Safety, Sensory, Skin Integrity  OT Balance, Behavior, Cognition, Endurance, Motor, Perception, Safety, Sensory, Vision  SLP Cognition, Nutrition  TR         Basic ADL's: OT Grooming, Bathing, Dressing, Toileting, Eating     Advanced  ADL's: OT       Transfers: PT Bed Mobility, Bed to Chair, Car, [employer], Civil Service fast streamer, Research scientist (life sciences): PT Ambulation, Psychologist, prison and probation services, Stairs     Additional Impairments: OT Fuctional Use of Upper Extremity  SLP Swallowing, Social Cognition   Problem Solving, Awareness  TR      Anticipated Outcomes Item Anticipated Outcome  Self Feeding S  Swallowing  Supervision with least restrictive diet   Basic self-care  S  Toileting  S   Bathroom Transfers S  Bowel/Bladder  Continent, no retention, no S/S infection.Marland KitchenMarland KitchenMarland KitchenMin Assist  Transfers  Supervision assist and LRAD   Locomotion  Supervision Assist with LRAD   Communication     Cognition  Supervision with basic  Pain  Managed  Safety/Judgment  Increased safety awareness, no falls, injury this admission.   Therapy Plan: PT Intensity: Minimum of 1-2 x/day ,45 to 90 minutes PT Frequency: 5 out of 7 days PT Duration Estimated Length of Stay: 7-10 days  OT Intensity: Minimum of 1-2 x/day, 45 to 90 minutes OT Frequency: 5 out of 7 days OT Duration/Estimated Length of Stay: 7-10 SLP Intensity: Minumum of 1-2 x/day, 30 to 90 minutes SLP Frequency: 3 to 5 out of 7 days SLP Duration/Estimated Length of Stay: 7 to  10 days       Team Interventions: Nursing Interventions Patient/Family Education, Disease Management/Prevention, Discharge Planning, Psychosocial Support, Medication Management, Dysphagia/Aspiration Precaution Training, Cognitive Remediation/Compensation  PT interventions Ambulation/gait training, Warden/ranger, Cognitive remediation/compensation, Discharge planning, Disease management/prevention, DME/adaptive equipment instruction, Community reintegration, Functional electrical stimulation, Functional mobility training, Neuromuscular re-education, Pain management, Patient/family education, Psychosocial support, Skin care/wound management, Splinting/orthotics, Therapeutic Activities, Therapeutic Exercise, Stair training, UE/LE Strength taining/ROM, UE/LE Coordination activities, Visual/perceptual remediation/compensation, Wheelchair propulsion/positioning  OT Interventions Warden/ranger, Cognitive remediation/compensation, Discharge planning, Disease mangement/prevention, DME/adaptive equipment instruction, Functional mobility training, Neuromuscular re-education, Pain management, Patient/family education, Psychosocial support, Self Care/advanced ADL retraining, Therapeutic Activities, Therapeutic Exercise, UE/LE Strength taining/ROM, UE/LE Coordination activities, Visual/perceptual remediation/compensation  SLP Interventions Speech/Language facilitation, Dysphagia/aspiration precaution training, Patient/family education  TR Interventions    SW/CM Interventions Discharge Planning, Psychosocial Support, Patient/Family Education    Team Discharge Planning: Destination: PT-  ,OT- Home , SLP-Home Projected Follow-up: PT-Home health PT, OT-  24 hour supervision/assistance, Home health OT, SLP-24 hour supervision/assistance Projected Equipment Needs: PT-Rolling walker with 5" wheels, To be determined, OT- To be determined (likely none as pt has TTB and BSC), SLP-None recommended  by SLP Equipment Details: PT- , OT-  Patient/family involved in discharge planning: PT- Patient, Family member/caregiver,  OT-Family member/caregiver, SLP-Patient, Family member/caregiver  MD ELOS: 7-10 days Medical Rehab Prognosis:  Excellent Assessment: The patient has been admitted for CIR therapies with the diagnosis of right MCA infarct. The team will be addressing functional mobility, strength, stamina, balance, safety, adaptive techniques and equipment,  self-care, bowel and bladder mgt, patient and caregiver education, ego support, pt and family education, cognition and communication. Goals have been set at supervision for mobility, self-care and cognition. Mood and cultural perceptions have been a barrier so far. Ranelle Oyster.    [place] Dann T. [place] Festa, MD, FAAPMR      See Team Conference Notes for weekly updates to the plan of care

## 2017-04-08 NOTE — Plan of Care (Signed)
Problem: RH BOWEL ELIMINATION Goal: RH STG MANAGE BOWEL W/EQUIPMENT W/ASSISTANCE STG Manage Bowel With Equipment With min Assistance  Outcome: Progressing Patient using bathroom with mod I . Patient will not allow staff or family to come in bathroom

## 2017-04-08 NOTE — Progress Notes (Signed)
Speech Language Pathology Daily Session Note  Patient Details  Name: [name] MRN: [id number] Date of Birth: 10/01/1940  Today's Date: 04/08/2017 SLP Individual Time: 1100-1200 SLP Individual Time Calculation (min): 60 min  Short Term Goals: Week 1: SLP Short Term Goal 1 (Week 1): Pt will consume dysphagia 2 diet textures with min overt s/s of aspiration and supervision cues.  SLP Short Term Goal 2 (Week 1): Pt will scan to left of his environment to locate object in 50% of opportunities with Mod A cues.  SLP Short Term Goal 3 (Week 1): Pt will demonstrate increased safety awareness by identifying safety problems in picture cards with Min A and 50% accuracy.  SLP Short Term Goal 4 (Week 1): Pt will complete basic, familiar problem solving task with Min A.   Skilled Therapeutic Interventions: Skilled treatment session focused on dysphagia and cognition goals. SLP facilitated session by providing skilled observation of pt consuming dysphagia 3 snack with thin liquids. Historically, pt's daughter feeds him and he didn't display any overt s/s of aspiration during consumption. Recommend upgrading diet to dysphagia 3 with thin liquids. With the help of an interpreter, pt able to communicate basic information, label objects and and complete safety awareness problem solving cards with Mod I (likely d/t cultural differences and translation of language). Pt with no left attention - he had peanut butter graham cracker in left hand and never perceived it which made a mess. Pt resistive to any help from SLP to increase awareness. Pt returned to room, left upright in wheelchair with all needs within reach. Continue per current plan of care.      Function:  Eating Eating   Modified Consistency Diet: No Eating Assist Level: Supervision or verbal cues;More than reasonable amount of time (Daughter historically feeds pt)           Cognition Comprehension Comprehension assist level: Understands basic 75  - 89% of the time/ requires cueing 10 - 24% of the time;Understands basic 90% of the time/cues < 10% of the time  Expression   Expression assist level: Expresses basic needs/ideas: With extra time/assistive device  Social Interaction Social Interaction assist level: Interacts appropriately 50 - 74% of the time - May be physically or verbally inappropriate.  Problem Solving Problem solving assist level: Solves basic 90% of the time/requires cueing < 10% of the time  Memory Memory assist level: Recognizes or recalls 90% of the time/requires cueing < 10% of the time    Pain Pain Assessment Pain Assessment: No/denies pain Pain Score: 0-No pain PAINAD (Pain Assessment in Advanced Dementia) Breathing: normal Negative Vocalization: none Facial Expression: smiling or inexpressive Body Language: relaxed Consolability: no need to console PAINAD Score: 0  Therapy/Group: Individual Therapy  Merleen Picazo 04/08/2017, 12:12 PM

## 2017-04-09 ENCOUNTER — Inpatient Hospital Stay (HOSPITAL_COMMUNITY): Payer: Medicaid Other | Admitting: Physical Therapy

## 2017-04-09 ENCOUNTER — Inpatient Hospital Stay (HOSPITAL_COMMUNITY): Payer: Medicaid Other

## 2017-04-09 ENCOUNTER — Inpatient Hospital Stay (HOSPITAL_COMMUNITY): Payer: Medicaid Other | Admitting: Speech Pathology

## 2017-04-09 NOTE — Progress Notes (Signed)
Occupational Therapy Session Note  Patient Details  Name: Allie Dimmerurna Orrico MRN: [id number] Date of Birth: 08/24/1941  Today's Date: 04/09/2017 OT Individual Time: 0900-1015 OT Individual Time Calculation (min): 75 min    Short Term Goals: Week 1:  OT Short Term Goal 1 (Week 1): STG=LTG 2/2 ELOS  Skilled Therapeutic Interventions/Progress Updates:    Pt resting in bed upon arrival with daughter present.  No interpreter present and daughter assisted with interpreting.  Pt transferred to w/c with steady A and transitioned to gym.  Pt engaged in LUE [facility] tasks with emphasis on scanning to L to locate objects.  Attempted to engage pt in simple pattern on colored peg board but patient unable to comprehend instructions/directions.  Pt engaged in theraputty tasks with beads-placing and removing beads with LUE.  Pt easily distracted and required mod verbal cues to redirect to task.  Pt did not converse during session.  Pt remained in w/c and returned to room with daughter present.  Reeducated on calling for staff for assistance with transfers, etc.  Pt's daughter verbalized understanding.  All needs within reach.   Therapy Documentation Precautions:  Precautions Precautions: Fall Precaution Comments: left inattention Restrictions Weight Bearing Restrictions: No Pain:  Pt denies pain  See Function Navigator for Current Functional Status.   Therapy/Group: Individual Therapy  Rich BraveLanier, Fabyan Loughmiller Chappell 04/09/2017, 11:01 AM

## 2017-04-09 NOTE — Plan of Care (Signed)
Problem: RH BOWEL ELIMINATION Goal: RH STG MANAGE BOWEL WITH ASSISTANCE STG Manage Bowel with min Assistance.  Outcome: Not Progressing Patient constipated. Requires total assist. Goal: RH STG MANAGE BOWEL W/MEDICATION W/ASSISTANCE STG Manage Bowel with Medication with min Assistance.  Outcome: Not Progressing Total assist   Problem: RH SAFETY Goal: RH STG DEMO UNDERSTANDING HOME SAFETY PRECAUTIONS Outcome: Progressing Daughter can verbalize understanding of home safety.  Problem: RH COGNITION-NURSING Goal: RH STG ANTICIPATES NEEDS/CALLS FOR ASSIST W/ASSIST/CUES STG Anticipates Needs/Calls for Assist With min cues Assistance/Cues.  Outcome: Not Progressing Total assist to call for assist. Patient does not initiate.  Problem: RH KNOWLEDGE DEFICIT Goal: RH STG INCREASE KNOWLEDGE OF DIABETES Outcome: Progressing Daughter can verbalize knowledge of diabetes Goal: RH STG INCREASE KNOWLEDGE OF HYPERTENSION Outcome: Progressing Daughter can verbalize knowledge of hypertension Goal: RH STG INCREASE KNOWLEDGE OF DYSPHAGIA/FLUID INTAKE Outcome: Progressing Daughter can verbalize knowledge of dysphasia

## 2017-04-09 NOTE — Progress Notes (Signed)
Speech Language Pathology Daily Session Note  Patient Details  Name: [name] MRN: [id number] Date of Birth: 06/11/1941  Today's Date: 04/09/2017 SLP Individual Time: 1100-1155 SLP Individual Time Calculation (min): 55 min  Short Term Goals: Week 1: SLP Short Term Goal 1 (Week 1): Pt will consume dysphagia 2 diet textures with min overt s/s of aspiration and supervision cues.  SLP Short Term Goal 2 (Week 1): Pt will scan to left of his environment to locate object in 50% of opportunities with Mod A cues.  SLP Short Term Goal 3 (Week 1): Pt will demonstrate increased safety awareness by identifying safety problems in picture cards with Min A and 50% accuracy.  SLP Short Term Goal 4 (Week 1): Pt will complete basic, familiar problem solving task with Min A.   Skilled Therapeutic Interventions: Skilled treatment session focused on cognitive and dysphagia goals. SLP facilitated session by providing encouragement and multiple repetitions for patient to engage in conversation with clinician. SLP also facilitated session by providing Mod verbal cues for problem solving and initiation for self-feeding with lunch meal of Dys. 3 textures with thin liquids. Patient consumed meal without overt s/s of aspiration with supervision verbal cues needed for use of small bites. Patient's daughter present and educated on the importance of patient feeding himself and strategies to utilize to maximize visual scanning to left field of environment. Patient left upright in wheelchair with all needs within reach. Continue with current plan of care.      Function:  Eating Eating   Modified Consistency Diet: No Eating Assist Level: Supervision or verbal cues;More than reasonable amount of time;Help managing cup/glass           Cognition Comprehension Comprehension assist level: Understands basic 75 - 89% of the time/ requires cueing 10 - 24% of the time;Understands basic 90% of the time/cues < 10% of the time   Expression   Expression assist level: Expresses basic needs/ideas: With extra time/assistive device  Social Interaction Social Interaction assist level: Interacts appropriately 50 - 74% of the time - May be physically or verbally inappropriate.  Problem Solving Problem solving assist level: Solves basic 90% of the time/requires cueing < 10% of the time  Memory Memory assist level: Recognizes or recalls 90% of the time/requires cueing < 10% of the time    Pain No/Denies Pain   Therapy/Group: Individual Therapy  [name] 04/09/2017, 2:19 PM

## 2017-04-09 NOTE — Progress Notes (Signed)
Physical Therapy Session Note  Patient Details  Name: [name] MRN: [id number] Date of Birth: 04/30/1941  Today's Date: 04/09/2017 PT Individual Time: 1430-1545 PT Individual Time Calculation (min): 75 min   Short Term Goals: Week 1:  PT Short Term Goal 1 (Week 1): STG=LTG due to ELOS   Skilled Therapeutic Interventions/Progress Updates: Pt received supine in bed, denies pain and agreeable to treatment. Interpreter used throughout session. Supine>sit with S; pt dons shoes with setupA with encouragement to family to allow pt to perform as much as he is able without assist. Gait to gym with RW and close S; pt requesting not to touch him while walking. Gait navigating around cones for visual scanning, L attention and improved management and safety with RW. Requires max multimodal cueing initially, reduced to min cueing by completion of second trial. Stairs x2 trials in stairwell with L handrail ascent with close S. Discussed recommendation with pt and daughter that pt side step up and down stairs, have family member directly beside him at all times, and recommend closed toe shoes to reduce fall risk. Performed nustep x3 min while therapist and pt's daughter discussed equipment needs with CSW. Gait trial x15' with RW and attempted use of arrow on RW to improve navigation and provide visual feedback for task. Pt became somewhat discouraged, appeared to freeze in place not following commands for hands on RW, required maxA to sit in chair to rest due to apparent fatigue. Vitals assessed BP 142/87 HR 77; pt with minimal verbal responses to therapist, daughter reports he has a headache and requesting medication. Returned to room totalA in w/c. Stand pivot transfer to bed with S. Remained supine in bed at end of session, bed alarm intact and all needs in reach. RN alerted to unusual freezing episode, vitals WNL, and pt with headache requesting medication.      Therapy Documentation Precautions:   Precautions Precautions: Fall Precaution Comments: left inattention Restrictions Weight Bearing Restrictions: No   See Function Navigator for Current Functional Status.   Therapy/Group: Individual Therapy  Vista Lawmanlizabeth J Tygielski 04/09/2017, 3:50 PM

## 2017-04-09 NOTE — Progress Notes (Signed)
Reviewed with patient and daughter via interpretor; education on strokes and medications patient is receiving. Daughter verbalized understanding. Instructed daughter to notify staff when patient up to collect UA. Patient allows RN to do care at this time. Patient complained of headache during therapy. Tylenol 650 mg po given with relief. See MAR. Continue with plan of care. Cleotilde NeerJoyce, Micco Bourbeau S

## 2017-04-09 NOTE — Progress Notes (Signed)
Social Work  Social Work OrthoptistAssessment and Plan  (Assessment note delayed due to availability of interpreter support)   Patient Details  Name: [name] MRN: [id number] Date of Birth: 05/06/1941  Today's Date: 04/09/2017  Problem List:  Patient Active Problem List   Diagnosis Date Noted  . 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 Acute ischemic right MCA stroke (HCC) 04/05/2017  . Left hemiparesis (HCC)   . Oropharyngeal dysphagia   . Confusion   . Altered mental state   . CVA (cerebral vascular accident) (HCC) 04/02/2017  . AKI (acute kidney injury) (HCC) 04/02/2017  . DM (diabetes mellitus) (HCC) 04/02/2017  . Elevated transaminase level 04/02/2017  . 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 Diabetic complication (HCC) 03/26/2017  . Dizziness 03/26/2017  . Postop check 03/11/2012  . HYDROCELE, RIGHT 08/28/2010  . NOCTURIA 08/28/2010  . OTH&UNSPEC NONINFECTIOUS GASTROENTERITIS&COLITIS 06/29/2010  . ABDOMINAL PAIN-RLQ 06/29/2010  . ABNORMAL FINDINGS GI TRACT 06/29/2010  . HYPERTENSION, BENIGN ESSENTIAL 05/02/2010  . LEUKOCYTOSIS 04/20/2010  . COLITIS, HX OF 04/20/2010   Past Medical History:  Past Medical History:  Diagnosis Date  . Diabetes (HCC)   . Dizziness   . Hypertension    Past Surgical History:  Past Surgical History:  Procedure Laterality Date  . LAPAROSCOPIC APPENDECTOMY  02/21/2012   Procedure: APPENDECTOMY LAPAROSCOPIC;  Surgeon: Cherylynn RidgesJames O Wyatt, MD;  [facility];  Service: General;  Laterality: N/A;   Social History:  reports that he has never smoked. He has never used smokeless tobacco. He reports that he does not drink alcohol or use drugs.  Family / Support Systems Marital Status: Widow/Widower How Long?: 1999 Patient Roles: Parent, Other (Comment) (grandparent) Children: daughter, Alvy BealSita Beedy (pt lives with her) @ (C) [phone];  daughter, Padam (local) Other Supports: nephew, Edyth GunnelsKrishna Rousseau (local) @ (C) [phone] Anticipated  Caregiver: daughter and her family  Ability/Limitations of Caregiver: family will provide 24/7 care  Caregiver Availability: 24/7 Family Dynamics: Daughter and other family here daily with pt and assisting with care.  Very supportive and fully intend to continue providing 24/7 assist at home.  Social History Preferred language: English Religion: Other Cultural Background: [place] originally from [place] and moved to [place] ~ 8 yrs ago. Education: limited - worked as a Dance movement psychotherapistfamer in [place] Read: Yes (in native language) Write: Yes Employment Status: Unemployed Date Retired/Disabled/Unemployed: has not worked since leaving [place] Legal Hisotry/Current Legal Issues: pt is legal resident Guardian/Conservator: None - per MD, pt is not capable of making decisions on her own behalf - defer to daughter   Abuse/Neglect Physical Abuse: Denies Verbal Abuse: Denies Sexual Abuse: Denies Exploitation of patient/patient's resources: Denies Self-Neglect: Denies  Emotional Status Pt's affect, behavior adn adjustment status: Pt makes little to no attempt to answer questions.  Daughter reports that he stopped communicating as normal after a fall in 2015.  "He just doesn't talk to us like he did before."  Pt with flat affect and looking downward during interview.  Daughter notes this is not a new behavior.  Due to anticipated short LOS and language barrier - will not refer for neuropsychology while here.  Pt started on Celexa since CVA Recent Psychosocial Issues: As noted, daughter report pt more disengaged since falling in 2015?   Pyschiatric History: Daughter reports pt became very depressed when widowed in 1999 and did take anti-depressants for a short time.  Daughter did not think he was currently taking any meds but review of MAR shows Celexa being administered. Substance Abuse History:  NOne  Patient / Family Perceptions, Expectations & Goals Pt/Family understanding of illness & functional limitations: Pt aware he  suffered a CVA per interpreter and daughter.  Seems aware of his deficits?  and reason for CIR admit. Premorbid pt/family roles/activities: Pt was moving about his home without walker after initial d/c but daughter notes he is very sedentary. Anticipated changes in roles/activities/participation: Little change than prior d/c levels.  daughter and family to continue providing 24/7. Pt/family expectations/goals: Pt eager to go home.  Community Resources [employer]: Other (Comment) [facility] providing PCS aide) Premorbid Home Care/DME Agencies: Other (Comment) ([facility]) Transportation available at discharge: yes  Discharge Planning Living Arrangements: Children, Other relatives Support Systems: Children, Other relatives Type of Residence: Private residence Insurance Resources: [employer] (specify county) Architect: Family Support Financial Screen Referred: No Living Expenses: Psychologist, sport and exercise Management: Family Does the patient have any problems obtaining your medications?: No Home Management: family Patient/Family Preliminary Plans: Pt to return home with family to resume providing 24/7 assist along with PCS aide support Social Work Anticipated Follow Up Needs: HH/OP Expected length of stay: 7-10 days  Clinical Impression Unfortunate gentleman, originally from [place], here following the extension of previous CVA.  Very flat and offers little verbal interaction with myself or others in the room.  Daughter answering most of the questions for pt unless I insisted pt response.  We discussed possible depression which daughter agrees could be an issue.  Clarified that he is receiving anti-depressants now and she is agreeable.  Will assist with resumption of HH and PCS aide services. Team anticipates short LOS and supervision goals.  FAmily to provide 24/7 supervision.  Ajooni Karam 04/09/2017, 1:27 PM

## 2017-04-09 NOTE — Progress Notes (Signed)
[facility]IC[facility]ILITATION     PROGRESS NOTE    Subjective/Complaints: No new problems reported  ROS: Limited due to cognitive/behavioral   Objective: Vital Signs: Blood pressure (!) 146/92, pulse 66, temperature 97.7 F (36.5 C), temperature source Oral, resp. rate 18, height 5\' 7"  (1.702 m), weight 65 kg (143 lb 4.8 oz), SpO2 100 %. No results found. No results for input(s): WBC, HGB, HCT, PLT in the last 72 hours. No results for input(s): NA, K, CL, GLUCOSE, BUN, CREATININE, CALCIUM in the last 72 hours.  Invalid input(s): CO CBG (last 3)   Recent Labs  04/07/17 1156 04/08/17 1145 04/08/17 1633  GLUCAP 158* 112* 131*    Wt Readings from Last 3 Encounters:  04/05/17 65 kg (143 lb 4.8 oz)  04/02/17 65.1 kg (143 lb 8.3 oz)  03/26/17 65.7 kg (144 lb 14.4 oz)    Physical Exam:  Constitutional: He appears well-developedand well-nourished.  HENT:  Head: Normocephalicand atraumatic.  Mouth/Throat: Oropharynx is clear and moist.  Eyes: Pupils are equal, round, and reactive to light. Right conjunctiva is injected.  Neck: Normal range of motion. Neck supple.  Cardiovascular: RRR without murmur. No JVD  .  Respiratory: CTA Bilaterally without wheezes or rales. Normal effort  GI: Soft. Bowel sounds are normal. He exhibits no distension. There is no tenderness.  Musculoskeletal: He exhibits no edemaor tenderness.  Neurological: He is alert.  Flat affect. Does not make attempt to communicate.  Moves all 4's but demonstrates weakness on left more than right---however remains inconsistent. Senses pain on all 4's.  Skin: Skin is warmand dry.  Psychiatric: His affect is blunt. He is withdrawn. He exhibits a depressed mood. He is noncommunicative.    Assessment/Plan: 1. 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 Physiatrist is providing close team supervision and 24 hour management of active medical problems listed below. Physiatrist and rehab team continue to assess barriers to discharge/monitor patient progress toward functional and medical goals.  Function:  Bathing Bathing position Bathing activity did not occur:  (simulated) Position: Shower  Bathing parts Body parts bathed by patient: Right arm, Left arm, Chest, Abdomen, Front perineal area, Buttocks, Right upper leg, Left upper leg, Right lower leg, Left lower leg Body parts bathed by helper: Back  Bathing assist Assist Level: Touching or steadying assistance(Pt > 75%)      Upper Body Dressing/Undressing Upper body dressing   What is the patient wearing?: Pull over shirt/dress     Pull over shirt/dress - Perfomed by patient: Thread/unthread right sleeve, Thread/unthread left sleeve, Put head through opening, Pull shirt over trunk          Upper body assist Assist Level: Supervision or verbal cues      Lower Body Dressing/Undressing Lower body dressing   What is the patient wearing?: Non-skid slipper socks, Pants     Pants- Performed by patient: Thread/unthread right pants leg, Thread/unthread left pants leg, Pull pants up/down   Non-skid slipper socks- Performed by patient: Don/doff right sock, Don/doff left sock                    Lower body assist Assist for lower body dressing: Touching or steadying assistance (Pt > 75%)      Toileting Toileting   Toileting steps completed by patient: Adjust clothing prior to toileting, Performs perineal hygiene, Adjust clothing after  toileting      Toileting assist Assist level: Supervision or verbal cues   Transfers Chair/bed transfer   Chair/bed transfer method: Ambulatory Chair/bed transfer assist level: Touching or steadying assistance (Pt > 75%) Chair/bed transfer assistive device: Armrests, Patent attorneyWalker     Locomotion Ambulation     Max distance: 150 Assist  level: Touching or steadying assistance (Pt > 75%)   Wheelchair Wheelchair activity did not occur: Refused        Cognition Comprehension Comprehension assist level: Understands basic 75 - 89% of the time/ requires cueing 10 - 24% of the time, Understands basic 90% of the time/cues < 10% of the time  Expression Expression assist level: Expresses basic needs/ideas: With extra time/assistive device  Social Interaction Social Interaction assist level: Interacts appropriately 50 - 74% of the time - May be physically or verbally inappropriate.  Problem Solving Problem solving assist level: Solves basic 90% of the time/requires cueing < 10% of the time  Memory Memory assist level: Recognizes or recalls 90% of the time/requires cueing < 10% of the time   Medical Problem List and Plan: 1. Functional deficits and left hemiparesissecondary to right MCA infarct/recent UTI -continue CIR  -team conference today   2. DVT Prophylaxis/Anticoagulation: Pharmaceutical: Lovenox 3. Pain Management: N/A 4. Mood: daughter agrees patient is depressed  -will initiate low dose celexa at hs.  5. Neuropsych: This patient is notcapable of making decisions on hisown behalf. 6. Skin/Wound Care: routine pressure relief measures.  7. Fluids/Electrolytes/Nutrition: Encourage po intake. On dysphagia 1, thin liquids.  8. Pyuria: ceftriaxone completed 9. Low B 12 levels: Low normal levels supplemented by Im injections.  10. Acute renal failure: Improved with hydration.  11. Dyslipidemia: On Lipitor.   LOS (Days) 4 A FACE TO FACE EVALUATION WAS PERFORMED  Ranelle OysterSWARTZ,Toshiro Hanken T, MD 04/09/2017 8:59 AM

## 2017-04-09 NOTE — Care Management Note (Signed)
[facility]patient Rehabilitation [facility] Individual Statement of Services  Patient Name:  [name]  Date:  04/09/2017  Welcome to the Inpatient Rehabilitation Center.  Our goal is to provide you with an individualized program based on your diagnosis and situation, designed to meet your specific needs.  With this comprehensive rehabilitation program, you will be expected to participate in at least 3 hours of rehabilitation therapies Monday-Friday, with modified therapy programming on the weekends.  Your rehabilitation program will include the following services:  Physical Therapy (PT), Occupational Therapy (OT), Speech Therapy (ST), 24 hour per day rehabilitation nursing, Therapeutic Recreaction (TR), Neuropsychology, Case Management (Social Worker), Rehabilitation Medicine, Nutrition Services, Engineer, productionharmacy Services and Interpreter Services  Weekly team conferences will be held on Tuesdays to discuss your progress.  Your Social Worker will talk with you frequently to get your input and to update you on team discussions.  Team conferences with you and your family in attendance may also be held.  Expected length of stay: 7 days  Overall anticipated outcome: supervision  Depending on your progress and recovery, your program may change. Your Social Worker will coordinate services and will keep you informed of any changes. Your Social Worker's name and contact numbers are listed  below.  The following services may also be recommended but are not provided by the Inpatient Rehabilitation Center:    Home Health Rehabiltiation Services  Outpatient Rehabilitation Services    Arrangements will be made to provide these services after discharge if needed.  Arrangements include referral to agencies that provide these services.  Your insurance has been verified to be:  Medicaid Your primary doctor is:  Dr. Concepcion ElkAvbuere  Pertinent information will be shared with your doctor and your insurance company.  Social Worker:   [place] Stephana Morell, [place] [phone] or ([phone]) [phone]   Information discussed with and copy given to patient by: Amada JupiterHOYLE, Curlie Sittner, 04/09/2017, 1:00 PM

## 2017-04-10 ENCOUNTER — Inpatient Hospital Stay (HOSPITAL_COMMUNITY): Payer: Medicaid Other | Admitting: Physical Therapy

## 2017-04-10 ENCOUNTER — Inpatient Hospital Stay (HOSPITAL_COMMUNITY): Payer: Medicaid Other | Admitting: Speech Pathology

## 2017-04-10 ENCOUNTER — Inpatient Hospital Stay (HOSPITAL_COMMUNITY): Payer: Medicaid Other

## 2017-04-10 DIAGNOSIS — E785 Hyperlipidemia, unspecified: Secondary | ICD-10-CM

## 2017-04-10 NOTE — Progress Notes (Signed)
Speech Language Pathology Discharge Summary  Patient Details  Name: [name] MRN: [id number] Date of Birth: 28-Apr-1941  Today's Date: 04/10/2017 SLP Individual Time: 0905-1000 SLP Individual Time Calculation (min): 55 min   Skilled Therapeutic Interventions:  Pt was seen for skilled ST targeting goals for cognition and dysphagia.  Therapist completed skilled observations with presentations of pt's currently prescribed diet.  Pt demonstrated no significant oral residue with dys 3 solids and no overt s/s of aspiration with thin liquids.  Pt's grandson was present during today's therapy session and was educated regarding rationale behind diet of dys 3 textures; however, therapist doubts that pt will be fully compliant with recommendations given history of limited participation in therapies while inpatient.  Therefore, trials of regular textures were assessed during treatment session to address safety with advanced consistencies.  Pt demonstrated increased pocketing with regular solids which he was able to clear with self initiated use of liquid wash.  Therapist also provided skilled education to pt's grandson regarding current cognitive limitations and their impact on pt's independence in the home environment, emphasizing the need for 24/7 supervision due to left inattention and poor safety awareness.  Grandson verbalized understanding.  Pt needed mod assist and more than a reasonable amount of time to locate ~50% of targets on a basic symbol cancellation task.  He also needed max assist verbal cues for route finding when ambulating back to his room.  Pt was left in bed with grandson at bedside and call bell within reach.  Bed alarm set.      Patient has met 3 of 5 long term goals.  Patient to discharge at [facility] level.  Reasons goals not met:     Clinical Impression/Discharge Summary:   Pt has made limited gains in cognition but marked improvement in swallowing function as evidenced by his  ability to tolerate progressive diet upgrades.  Pt is now consuming dys 3 solids and thin liquids with mod I use of swallowing precautions to clear residue from the oral cavity resulting from left sided oral motor weakness.  Pt continues to demonstrate moderate cognitive impairments s/p right brain dysfunction characterized by left in attention, poor safety awareness, and decreased emergent awareness of deficits which impact all other cognitive processes.  Pt and family education has been ongoing while inpatient; however, pt himself has historically been resistant to any assistance from staff or family members.  Per family report, pt will have 24/7 supervision from his daughter at discharge.  SLP also recommends that pt have ongoing ST follow up if possible and assistance for medication and financial management.      Care Partner:  Caregiver Able to Provide Assistance: Yes  Type of Caregiver Assistance: Physical;Cognitive  Recommendation:  24 hour supervision/assistance;Home Health SLP  Rationale for SLP Follow Up: Maximize functional communication;Reduce caregiver burden;Maximize cognitive function and independence;Maximize swallowing safety   Equipment: none recommended by SLP    Reasons for discharge: Discharged from hospital   Patient/Family Agrees with Progress Made and Goals Achieved: Yes   Function:  Eating Eating   Modified Consistency Diet: Yes Eating Assist Level: More than reasonable amount of time           Cognition Comprehension Comprehension assist level: Understands basic 75 - 89% of the time/ requires cueing 10 - 24% of the time  Expression   Expression assist level: Expresses basic needs/ideas: With extra time/assistive device  Social Interaction Social Interaction assist level: Interacts appropriately 50 - 74% of the time - May  be physically or verbally inappropriate.  Problem Solving Problem solving assist level: Solves basic 50 - 74% of the time/requires cueing 25  - 49% of the time  Memory Memory assist level: Recognizes or recalls 50 - 74% of the time/requires cueing 25 - 49% of the time   Emilio Math 04/10/2017, 10:22 AM

## 2017-04-10 NOTE — Progress Notes (Signed)
Occupational Therapy Discharge Summary  Patient Details  Name: [name] MRN: [id number] Date of Birth: January 21, 1941  Patient has met 8 of 11 long term goals due to improved activity tolerance, improved balance, postural control and ability to compensate for deficits.  Pt completes all BADLs at supervision level but is hesitant in allowing family members to assist/supervise.  Pt's family has verbalized understanding of pt's L inattention and recommended strategies to facilitate improved safety when pt is ambulating/transferring. Pt continues to exhibit L inattention and R gaze preference and requires max verbal cues to scan to his L. Pt's daughter and grandson have been present and provide appropriate assist/supervision.  Pt's daughter/grandson verbalize understanding of recommendations. Patient to discharge at overall Supervision level.  Patient's care partner is independent to provide the necessary physical and cognitive assistance at discharge.    Reasons goals not met: Pt still requires supervision for dynamic sitting balance.  Pt also still requires max verbal cues for scanning to the left visual field.  Pt also requires max cues for emergent awareness resulting in requiring 24/7 supervision for all ADLs  Recommendation:  Patient will benefit from ongoing skilled OT services in home health setting to continue to advance functional skills in the area of BADL.  Equipment: No equipment provided Pt owns tub bench and BSC from earlier hospital stay  Reasons for discharge: treatment goals met and discharge from hospital  Patient/family agrees with progress made and goals achieved: Yes  OT Discharge Vision Baseline Vision/History: Wears glasses Wears Glasses: At all times Vision Assessment?: Vision impaired- to be further tested in functional context Eye Alignment: Within Functional Limits Ocular Range of Motion: Restricted on the left Alignment/Gaze Preference: Gaze right Tracking/Visual  Pursuits: Requires cues, head turns, or add eye shifts to track Saccades: Decreased speed of saccadic movement;Additional head turns occurred during testing Visual Fields: Right visual field deficit Additional Comments: vision not formally tested 2/2 pt unable to follow commands; possible cognitive/behavioral/language barrier Perception  Perception: Impaired Praxis Praxis: Impaired Cognition Overall Cognitive Status: Impaired/Different from baseline Arousal/Alertness: Awake/alert Orientation Level: Oriented X4 Attention: Sustained Focused Attention: Appears intact Sustained Attention: Appears intact Memory: Impaired Memory Impairment: Decreased recall of new information;Retrieval deficit Awareness: Impaired Awareness Impairment: Intellectual impairment Problem Solving: Impaired Problem Solving Impairment: Functional basic Executive Function: Decision Making;Self Monitoring;Self Correcting Decision Making: Impaired Decision Making Impairment: Functional basic Self Monitoring: Impaired Self Monitoring Impairment: Functional basic Self Correcting: Impaired Self Correcting Impairment: Functional basic Behaviors: Poor frustration tolerance Safety/Judgment: Impaired Sensation Sensation Light Touch: Impaired by gross assessment Hot/Cold: Impaired by gross assessment Proprioception: Impaired by gross assessment Coordination Gross Motor Movements are Fluid and Coordinated: No Fine Motor Movements are Fluid and Coordinated: No Trunk/Postural Assessment  Cervical Assessment Cervical Assessment: Within Functional Limits Thoracic Assessment Thoracic Assessment: Within Functional Limits Postural Control Postural Control: Deficits on evaluation  Balance Dynamic Sitting Balance Dynamic Sitting - Balance Support: No upper extremity supported Dynamic Sitting - Level of Assistance: 5: Stand by assistance Extremity/Trunk Assessment RUE Assessment RUE Assessment: Within Functional  Limits LUE Assessment LUE Assessment: Within Functional Limits   See Function Navigator for Current Functional Status.  Leotis Shames [facility] 04/10/2017, 11:58 AM

## 2017-04-10 NOTE — Discharge Summary (Signed)
Physician Discharge Summary  Patient ID: [name] MRN: [id number] DOB/AGE: 75/09/1940 75 y.o.  Admit date: 04/05/2017 Discharge date: 04/11/2017  Discharge Diagnoses:  Principal Problem:   Acute ischemic right MCA stroke [facility](HCC) Active Problems:   DM (diabetes mellitus) (HCC)   Left hemiparesis (HCC)   Oropharyngeal dysphagia   Reactive depression   Dyslipidemia   Discharged Condition: stable   Significant Diagnostic Studies: N/A   Labs:  Basic Metabolic Panel: BMP Latest Ref Rng & Units 04/06/2017 04/04/2017 04/02/2017  Glucose 65 - 99 mg/dL 89 79 84  BUN 6 - 20 mg/dL 12 13(K22(H) 44(W37(H)  Creatinine 0.61 - 1.24 mg/dL 1.021.06 7.251.23 3.66(Y1.60(H)  Sodium 135 - 145 mmol/L 140 141 140  Potassium 3.5 - 5.1 mmol/L 3.5 3.6 4.5  Chloride 101 - 111 mmol/L 106 111 106  CO2 22 - 32 mmol/L 22 18(L) -  Calcium 8.9 - 10.3 mg/dL 9.0 4.0(H8.8(L) -    CBC: CBC Latest Ref Rng & Units 04/06/2017 04/04/2017 04/02/2017  WBC 4.0 - 10.5 K/uL 5.5 6.4 -  Hemoglobin 13.0 - 17.0 g/dL 47.414.9 25.914.8 18.0(H)  Hematocrit 39.0 - 52.0 % 42.0 42.2 53.0(H)  Platelets 150 - 400 K/uL 135(L) 161 -     CBG:  Recent Labs Lab 04/06/17 1705 04/06/17 2110 04/07/17 1156 04/08/17 1145 04/08/17 1633  GLUCAP 92 74 158* 112* 131*    Brief HPI:    [name] a 76 y.o.left handed [place] with history of T2DM, depression, dizziness, recent right frontoparietal/MCA infarct 03/27/17 with right gaze preference and left inattention. Patient with history of dizziness and progressive weakness since fall in 2015. Per records he was discharged to home 7/5 at min assist level and HH therapy (he declined CIR). He was readmitted on 04/02/17 with mental status changes, left sided weakness, lack of appetite and nonverbal state. He was found to have UTI, acute renal failure with SCr 2.04and as well as further propagation of acute right frontoparietal infarct. EEG without evidence of seizures. Neurology felt that patient with  encephalopathy due to UTI in setting of progression of L-MCA stroke and to continue ASA/Plavix. Therapy evaluations done and CIR recommended for follow up therapy.    Hospital Course: Bradley Dimmerurna Zajkowski was admitted to rehab 04/05/2017 for inpatient therapies to consist of PT, ST and OT at least three hours five days a week. Past admission physiatrist, therapy team and rehab RN have worked together to provide customized collaborative inpatient rehab. He completed five day course of ceftriaxone without side effects. Po intake has improved and he is tolerating dysphagia 3 diet without S/S of aspiration. Follow up labs showed that acute renal failure has resolved and mild thrombocytopenia noted and no signs of bleeding noted.  Patient refused follow up labs prior and will need CBC rechecked in 1-2 weeks. Diabetes has been monitored with achs cbg checks. as BS started trending upwards, Amaryl was resumed at 2 mg daily. He is to follow up with primary MD for further titration of medications. He remains continent of bowel and bladder. He continues to have right gaze preference requiring max cues to scan to the left.  24 hours supervision is recommended for safety. He will continue to receive follow up HHPT, HHOT, HHST and HHRN by [facility] after discharge.  He was discharged to home on 7/19   Rehab course: During patient's stay in rehab weekly team conferences were held to monitor patient's progress, set goals and discuss barriers to discharge. At admission, patient required  Patient has had improvement in activity tolerance, balance, postural control, as well as ability to compensate for deficits.   He is tolerating diet without overt signs of aspiration. He is able to express basic needs with assistance and extra time. He requires min to mod assist for basic problem solving.  He is able to complete ADL tasks with supervision.  He is ambulating 150' with supervision. Family education was completed regarding  safety and all aspects of care and mobility.    Disposition: 01-Home or Self Care   Diet: dysphagia 3, thin liquids.   Special Instructions: 1. Recheck CBC for follow up on thrombocytopenia.  2.  Check BS bid.  3. Needs 24 hours supervision.   Discharge Instructions    Ambulatory referral to Physical Medicine Rehab    Complete by:  As directed    1-2 weeks transitional care appt   PR BLOOD GLUCOSE TEST STRIPS    Complete by:  As directed    To monitor BS twice a day     Allergies as of 04/11/2017   No Known Allergies     Medication List    STOP taking these medications   cefTRIAXone 1 g in dextrose 5 % 50 mL   insulin aspart 100 UNIT/ML injection Commonly known as:  novoLOG     TAKE these medications   albuterol 108 (90 Base) MCG/ACT inhaler Commonly known as:  PROVENTIL HFA;VENTOLIN HFA Inhale 2 puffs into the lungs every 6 (six) hours as needed for wheezing or shortness of breath.   aspirin EC 81 MG tablet Take 81 mg by mouth daily.   atorvastatin 80 MG tablet Commonly known as:  LIPITOR Take 1 tablet (80 mg total) by mouth daily at 6 PM.   citalopram 10 MG tablet Commonly known as:  CELEXA Take 1 tablet (10 mg total) by mouth at bedtime.   clopidogrel 75 MG tablet Commonly known as:  PLAVIX Take 1 tablet (75 mg total) by mouth daily.   fluticasone 50 MCG/ACT nasal spray Commonly known as:  FLONASE Place 2 sprays into both nostrils daily as needed for allergies.   glimepiride 4 MG tablet Commonly known as:  AMARYL Take 0.5 tablets (2 mg total) by mouth daily with breakfast. What changed:  how much to take   loratadine 10 MG tablet Commonly known as:  CLARITIN Take 10 mg by mouth daily.   meclizine 12.5 MG tablet Commonly known as:  ANTIVERT Take 12.5 mg by mouth 3 (three) times daily as needed for dizziness.   onetouch ultrasoft lancets Check blood sugars twice a day      Follow-up Information    Fleet Contras, MD Follow up on 04/24/2017.    Specialty:  Internal Medicine Why:  @ 10:45 am (hospital follow up appt) Contact information: [address] [place] [address] [phone]        Ranelle Oyster, MD Follow up.   Specialty:  Physical Medicine and Rehabilitation Why:  office will call you with follow up appointment Contact information: [address] [place] [address] [phone]        Micki Riley, MD. Call in 1 day(s).   Specialties:  Neurology, Radiology Why:  for follow up appointmentin 4-6 weeks. Contact information: [address] [place] [address] [phone]           Signed: Jacquelynn Cree 04/11/2017, 4:10 PM

## 2017-04-10 NOTE — Plan of Care (Signed)
Problem: RH Balance Goal: LTG: Patient will maintain dynamic sitting balance (OT) LTG:  Patient will maintain dynamic sitting balance with assistance during activities of daily living (OT)  Outcome: Not Met (add Reason) Supervision for dynamic sitting balance  Problem: RH Vision Goal: RH LTG Vision (Specify) Outcome: Not Met (add Reason) Requires max verbal cues for scanning to L  Problem: RH Awareness Goal: LTG: Patient will demonstrate intellectual/emergent (OT) LTG: Patient will demonstrate intellectual/emergent/anticipatory awareness with assist during a functional activity  (OT)  Outcome: Not Met (add Reason) Requires max verbal cues for emergent awareness

## 2017-04-10 NOTE — Plan of Care (Signed)
Problem: RH Problem Solving Goal: LTG Patient will demonstrate problem solving for (SLP) LTG:  Patient will demonstrate problem solving for basic/complex daily situations with cues  (SLP)  Outcome: Not Met (add Reason) Min-mod assist needed for basic problem solving due to left inattention and other underlying cognitive deficits   Problem: RH Awareness Goal: LTG: Patient will demonstrate intellectual/emergent (SLP) LTG: Patient will demonstrate intellectual/emergent/anticipatory awareness with assist during a cognitive/linguistic activity  (SLP)  Outcome: Not Met (add Reason) Mod assist needed to recognize and correct errors in the moment during basic tasks

## 2017-04-10 NOTE — Patient Care Conference (Signed)
Inpatient RehabilitationTeam Conference and Plan of Care Update Date: 04/09/2017   Time: 2:15 PM    Patient Name: [name]      Medical Record Number: [id number]  Date of Birth: 02/01/1941 Sex: Male         Room/Bed: 4W16C/4W16C-01 Payor Info: Payor: MEDICAID [place] / Plan: MEDICAID [place] ACCESS / Product Type: *No Product type* /    Admitting Diagnosis: CVA  Admit Date/Time:  04/05/2017  5:41 PM Admission Comments: No comment available   Primary Diagnosis:  Acute ischemic right MCA stroke (HCC) Principal Problem: Acute ischemic right MCA stroke [facility](HCC)  Patient Active Problem List   Diagnosis Date Noted  . Dyslipidemia 04/10/2017  . 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 Acute ischemic right MCA stroke (HCC) 04/05/2017  . Left hemiparesis (HCC)   . Oropharyngeal dysphagia   . Confusion   . Altered mental state   . CVA (cerebral vascular accident) (HCC) 04/02/2017  . AKI (acute kidney injury) (HCC) 04/02/2017  . DM (diabetes mellitus) (HCC) 04/02/2017  . Elevated transaminase level 04/02/2017  . 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 Diabetic complication (HCC) 03/26/2017  . Dizziness 03/26/2017  . Postop check 03/11/2012  . HYDROCELE, RIGHT 08/28/2010  . NOCTURIA 08/28/2010  . OTH&UNSPEC NONINFECTIOUS GASTROENTERITIS&COLITIS 06/29/2010  . ABDOMINAL PAIN-RLQ 06/29/2010  . ABNORMAL FINDINGS GI TRACT 06/29/2010  . HYPERTENSION, BENIGN ESSENTIAL 05/02/2010  . LEUKOCYTOSIS 04/20/2010  . COLITIS, HX OF 04/20/2010    Expected Discharge Date: Expected Discharge Date: 04/11/17  Team Members Present: Physician leading conference: Dr. Faith RogueZachary Swartz Social Worker Present: Amada JupiterLucy Shadi Sessler, LCSW Nurse Present: Carmie EndAngie Joyce, RN PT Present: Alyson ReedyElizabeth Tygielski, PT OT Present: Ardis Rowanom Lanier, COTA;Jennifer Katrinka BlazingSmith, OT SLP Present: Feliberto Gottronourtney Payne, SLP PPS Coordinator present : Tora DuckMarie Noel, RN, CRRN     Current Status/Progress Goal Weekly Team Focus  Medical   right CVA with  left hemiparesis, sx exacerbated by UTI  improve engagement and interaction with staff  bp mgt, stroke education, depression/behavior   Bowel/Bladder   continent LBM noted by staff 7-12 miralax to be given daughter reports "he goes"   bowel movement every 2-3 days remain continent  educate patient and family on constipation and LOC   Swallow/Nutrition/ Hydration   dysphagia 3 diet with thin liquids  Supervision with least restrictive diet  use of compensatory swallow strategies   ADL's   steady A/supervision overall (per family report); L inattention/L visual deficits; decreased safety awareness  overall supervision   functional transfers, family education, BADL retraining, LUE NMR   Mobility   S bed mobility, gait with RW and stairs; requires max cues for L attention, safety with RW  modI bed mobility and transfers, S gait and stairs  L attention, safety with RW, safety awareness, pt/family education   Communication   nonverbal to staff , shakes head at times yes or no  min assist with family  edcuate family on asking patient questions to promote verbal responses   Safety/Cognition/ Behavioral Observations  Min A to Supervision with basic problem solving, emergent awarenss- Max A for left inattention  Supervision  anticipatory awareness education with family, Max A for left inattention   Pain   occasional headache tylenol 650 mg po  less than 2  educate family on non verbal cues to pain and manage with tyenol as needed   Skin   n/a          Rehab Goals Patient on target to meet rehab goals: Yes *See Care Plan and progress  notes for long and short-term goals.     Barriers to Discharge  Current Status/Progress Possible Resolutions Date Resolved   Physician    Medical stability;Behavior        ongoing family support and education, antidepressant medication      Nursing  Nutrition means;Medication compliance;Other (comments)  poor intake due to limited food choices, due to cultural  preferences Patient only intake is juices and soups and food from home, daughter to administer medications patient unable to manage          PT                    OT Other (comments)  Pt mood/depression may impact participation along with pt preferance to not have hands on therapy to facilitate normal movement             SLP Other (comments) (Cultural preference for male therapist) Pt with historical decreased interaciton with therapists            SW                Discharge Planning/Teaching Needs:  Plan to d/c home with daughter and her family able to provide 24/7 assistance.  Ongoing   Team Discussion:  Pt prefers male staff;  Awaiting UA results.  Daughters report he toilets and dsg himself.significant left inattenion.  Must encourage his independence - this was discussed with daughter.  At baseline and ready for d/c.  Revisions to Treatment Plan:  none    Continued Need for Acute Rehabilitation Level of Care: The patient requires daily medical management by a physician with specialized training in physical medicine and rehabilitation for the following conditions: Daily direction of a multidisciplinary physical rehabilitation program to ensure safe treatment while eliciting the highest outcome that is of practical value to the patient.: Yes Daily medical management of patient stability for increased activity during participation in an intensive rehabilitation regime.: Yes Daily analysis of laboratory values and/or radiology reports with any subsequent need for medication adjustment of medical intervention for : Neurological problems;Mood/behavior problems  Ashira Kirsten 04/10/2017, 2:29 PM

## 2017-04-10 NOTE — Progress Notes (Signed)
[facility] PHYSICAL MEDICINE & REHABILITATION     PROGRESS NOTE    Subjective/Complaints: Up with therapy. No new problems reported.   ROS: limited due to language/communication    Objective: Vital Signs: Blood pressure 127/88, pulse 70, temperature (!) 97.4 F (36.3 C), temperature source Oral, resp. rate 18, height 5\' 7"  (1.702 m), weight 65 kg (143 lb 4.8 oz), SpO2 98 %. No results found. No results for input(s): WBC, HGB, HCT, PLT in the last 72 hours. No results for input(s): NA, K, CL, GLUCOSE, BUN, CREATININE, CALCIUM in the last 72 hours.  Invalid input(s): CO CBG (last 3)   Recent Labs  04/07/17 1156 04/08/17 1145 04/08/17 1633  GLUCAP 158* 112* 131*    Wt Readings from Last 3 Encounters:  04/05/17 65 kg (143 lb 4.8 oz)  04/02/17 65.1 kg (143 lb 8.3 oz)  03/26/17 65.7 kg (144 lb 14.4 oz)    Physical Exam:  Constitutional: He appears well-developedand well-nourished.  HENT:  Head: Normocephalicand atraumatic.  Mouth/Throat: Oropharynx is clear and moist.  Eyes: Pupils are equal, round, and reactive to light. Right conjunctiva is injected.  Neck: Normal range of motion. Neck supple.  Cardiovascular: RRR without murmur. No JVD   .  Respiratory: CTA Bilaterally without wheezes or rales. Normal effort  GI: Soft. Bowel sounds are normal. He exhibits no distension. There is no tenderness.  Musculoskeletal: He exhibits no edemaor tenderness.  Neurological: He is alert.  Flat affect. Makes little attempt to communicate.  Moves all 4's but demonstrates weakness on left more than right- Senses pain on all 4's.  Skin: Skin is warmand dry.  Psychiatric: His affect is blunt. He is withdrawn.   Assessment/Plan: 1. 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 Physiatrist is providing close team supervision and 24 hour  management of active medical problems listed below. Physiatrist and rehab team continue to assess barriers to discharge/monitor patient progress toward functional and medical goals.  Function:  Bathing Bathing position Bathing activity did not occur:  (simulated) Position: Shower  Bathing parts Body parts bathed by patient: Right arm, Left arm, Chest, Abdomen, Front perineal area, Buttocks, Right upper leg, Left upper leg, Right lower leg, Left lower leg Body parts bathed by helper: Back  Bathing assist Assist Level: Touching or steadying assistance(Pt > 75%)      Upper Body Dressing/Undressing Upper body dressing   What is the patient wearing?: Pull over shirt/dress     Pull over shirt/dress - Perfomed by patient: Thread/unthread right sleeve, Thread/unthread left sleeve, Put head through opening, Pull shirt over trunk          Upper body assist Assist Level: Supervision or verbal cues      Lower Body Dressing/Undressing Lower body dressing   What is the patient wearing?: Non-skid slipper socks, Pants     Pants- Performed by patient: Thread/unthread right pants leg, Thread/unthread left pants leg, Pull pants up/down   Non-skid slipper socks- Performed by patient: Don/doff right sock, Don/doff left sock                    Lower body assist Assist for lower body dressing: Touching or steadying assistance (Pt > 75%)      Toileting Toileting   Toileting steps completed by patient: Adjust clothing prior to toileting, Performs perineal hygiene, Adjust clothing after toileting  Toileting assist Assist level: Supervision or verbal cues   Transfers Chair/bed transfer   Chair/bed transfer method: Ambulatory Chair/bed transfer assist level: Touching or steadying assistance (Pt > 75%) Chair/bed transfer assistive device: Armrests, Patent attorney     Max distance: 150 Assist level: Supervision or verbal cues   Wheelchair Wheelchair activity  did not occur: Refused        Cognition Comprehension Comprehension assist level: Understands basic 75 - 89% of the time/ requires cueing 10 - 24% of the time, Understands basic 90% of the time/cues < 10% of the time  Expression Expression assist level: Expresses basic needs/ideas: With extra time/assistive device  Social Interaction Social Interaction assist level: Interacts appropriately 50 - 74% of the time - May be physically or verbally inappropriate.  Problem Solving Problem solving assist level: Solves basic 90% of the time/requires cueing < 10% of the time  Memory Memory assist level: Recognizes or recalls 90% of the time/requires cueing < 10% of the time   Medical Problem List and Plan: 1. Functional deficits and left hemiparesissecondary to right MCA infarct/recent UTI -continue CIR  -pt showing gains and is approaching goals  -dc tomorrow  -Patient to see Rehab MD in the office for transitional care encounter in 1-2 weeks.  2. DVT Prophylaxis/Anticoagulation: Pharmaceutical: Lovenox 3. Pain Management: N/A 4. Mood: daughter agrees patient is depressed  -initiated low dose celexa at hs.  5. Neuropsych: This patient is notcapable of making decisions on hisown behalf. 6. Skin/Wound Care: routine pressure relief measures.  7. Fluids/Electrolytes/Nutrition: Encourage po intake. On dysphagia 1, thin liquids.  8. Pyuria: ceftriaxone completed 9. Low B 12 levels: Low normal levels supplemented by Im injections.  10. Acute renal failure: Improved with hydration.  11. Dyslipidemia: On Lipitor.   LOS (Days) 5 A FACE TO FACE EVALUATION WAS PERFORMED  Ranelle Oyster, MD 04/10/2017 9:23 AM

## 2017-04-10 NOTE — Discharge Instructions (Signed)
Inpatient Rehab Discharge Instructions  [name] Discharge date and time:  04/11/17  Activities/Precautions/ Functional Status: Activity: no lifting, driving, or strenuous exercise till cleared by MD Diet: cardiac diet and diabetic diet Wound Care: none needed   Functional status:  ___ No restrictions     ___ Walk up steps independently _X__ 24/7 supervision/assistance   ___ Walk up steps with assistance ___ Intermittent supervision/assistance  ___ Bathe/dress independently ___ Walk with walker     _X__ Bathe/dress with assistance ___ Walk Independently    ___ Shower independently ___ Walk with assistance    ___ Shower with assistance _X__ No alcohol     ___ Return to work/school ________    COMMUNITY REFERRALS UPON DISCHARGE:    Home Health:   PT     OT     ST                     Agency:  [facility] Phone: [phone]   Medical Equipment/Items Ordered:  Wheelchair, cushion and rolling walker                                                      Agency/Supplier:  [employer] [phone]     Special Instructions:    STROKE/TIA DISCHARGE INSTRUCTIONS SMOKING Cigarette smoking nearly doubles your risk of having a stroke & is the single most alterable risk factor  If you smoke or have smoked in the last 12 months, you are advised to quit smoking for your health.  Most of the excess cardiovascular risk related to smoking disappears within a year of stopping.  Ask you doctor about anti-smoking medications  [place] Quit Line: 1-800-QUIT NOW  Free Smoking Cessation Classes [phone]  CHOLESTEROL Know your levels; limit fat & cholesterol in your diet  Lipid Panel     Component Value Date/Time   CHOL 187 03/27/2017 0355   TRIG 97 03/27/2017 0355   HDL 42 03/27/2017 0355   CHOLHDL 4.5 03/27/2017 0355   VLDL 19 03/27/2017 0355   LDLCALC 126 (H) 03/27/2017 0355      Many patients benefit from treatment even if their cholesterol is at goal.  Goal: Total  Cholesterol (CHOL) less than 160  Goal:  Triglycerides (TRIG) less than 150  Goal:  HDL greater than 40  Goal:  LDL (LDLCALC) less than 100   BLOOD PRESSURE American Stroke Association blood pressure target is less that 120/80 mm/Hg  Your discharge blood pressure is:  BP: 127/88  Monitor your blood pressure  Limit your salt and alcohol intake  Many individuals will require more than one medication for high blood pressure  DIABETES (A1c is a blood sugar average for last 3 months) Goal HGBA1c is under 7% (HBGA1c is blood sugar average for last 3 months)  Diabetes:     Lab Results  Component Value Date   HGBA1C 5.6 03/27/2017     Your HGBA1c can be lowered with medications, healthy diet, and exercise.  Check your blood sugar as directed by your physician  Call your physician if you experience unexplained or low blood sugars.  PHYSICAL ACTIVITY/REHABILITATION Goal is 30 minutes at least 4 days per week  Activity: No driving, Therapies: see above Return to work: N/A  Activity decreases your risk of heart attack and stroke and makes your heart  stronger.  It helps control your weight and blood pressure; helps you relax and can improve your mood.  Participate in a regular exercise program.  Talk with your doctor about the best form of exercise for you (dancing, walking, swimming, cycling).  DIET/WEIGHT Goal is to maintain a healthy weight  Your discharge diet is: DIET DYS 3 Room service appropriate? Yes; Fluid consistency: Thin  liquids Your height is:  Height: 5\' 7"  (170.2 cm) Your current weight is: Weight: 65 kg (143 lb 4.8 oz) Your Body Mass Index (BMI) is:  BMI (Calculated): 22.5  Following the type of diet specifically designed for you will help prevent another stroke.  You are at goal weight.   Your goal Body Mass Index (BMI) is 19-24.  Healthy food habits can help reduce 3 risk factors for stroke:  High cholesterol, hypertension, and excess weight.  RESOURCES  Stroke/Support Group:  Call [phone]   STROKE EDUCATION PROVIDED/REVIEWED AND GIVEN TO PATIENT Stroke warning signs and symptoms How to activate emergency medical system (call 911). Medications prescribed at discharge. Need for follow-up after discharge. Personal risk factors for stroke. Pneumonia vaccine given:  Flu vaccine given:  My questions have been answered, the writing is legible, and I understand these instructions.  I will adhere to these goals & educational materials that have been provided to me after my discharge from the hospital.      My questions have been answered and I understand these instructions. I will adhere to these goals and the provided educational materials after my discharge from the hospital.  Patient/Caregiver Signature _______________________________ Date __________  Clinician Signature _______________________________________ Date __________  Please bring this form and your medication list with you to all your follow-up doctor's appointments.

## 2017-04-10 NOTE — Progress Notes (Signed)
Physical Therapy Discharge Summary  Patient Details  Name: [name] MRN: [id number] Date of Birth: [birth year]  Today's Date: 04/10/2017 PT Individual Time: 1420-1525 PT Individual Time Calculation (min): 65 min    Patient has met 9 of 9 long term goals due to improved activity tolerance, improved balance, improved postural control, increased strength, increased range of motion, decreased pain, ability to compensate for deficits, functional use of  left upper extremity and left lower extremity, improved awareness and improved coordination.  Patient to discharge at an ambulatory level Supervision.   Patient's care partner is independent to provide the necessary physical and cognitive assistance at discharge.  Reasons goals not met: All applicable treatment goals met.   Recommendation:  Patient will benefit from ongoing skilled PT services in home health setting to continue to advance safe functional mobility, address ongoing impairments in balance, awareness, safety with gait due to Inattention and L field cut, and minimize fall risk.  Equipment: No equipment provided  Reasons for discharge: treatment goals met and discharge from hospital  Patient/family agrees with progress made and goals achieved: Yes   PT treatment:   PT instructed pt in grad day assessment to measure progress towards goals. See below for details. Pt reports fatigue following grad day assessment and returned to room. Pt left sitting in Arm chair with family present and all needs met.    PT Discharge Precautions/Restrictions   fall. L inattention.    Pain   0/10  Vision/Perception  Vision - Assessment Eye Alignment: Within Functional Limits Ocular Range of Motion: Restricted on the left Alignment/Gaze Preference: Gaze right Tracking/Visual Pursuits: Requires cues, head turns, or add eye shifts to track Saccades: Decreased speed of saccadic movement;Additional head turns occurred during testing Additional  Comments: vision not formally tested 2/2 pt unable to follow commands; possible cognitive/behavioral/language barrier Perception Perception: Impaired Praxis Praxis: Impaired  Cognition Overall Cognitive Status: Impaired/Different from baseline Arousal/Alertness: Awake/alert Orientation Level: Oriented X4 Attention: Sustained Focused Attention: Appears intact Sustained Attention: Appears intact Memory: Impaired Memory Impairment: Decreased recall of new information;Retrieval deficit Awareness: Impaired Awareness Impairment: Intellectual impairment Problem Solving: Impaired Problem Solving Impairment: Functional basic Behaviors: Poor frustration tolerance Safety/Judgment: Impaired Sensation Sensation Light Touch: Impaired by gross assessment Hot/Cold: Impaired by gross assessment Proprioception: Impaired by gross assessment Coordination Gross Motor Movements are Fluid and Coordinated: No Fine Motor Movements are Fluid and Coordinated: No Motor  Motor Motor: Hemiplegia Motor - Skilled Clinical Observations: L sided Hemiplegia UE>LE Motor - Discharge Observations: Improved motor control and attention to L side of body compared to Evalulaiton.   Mobility Bed Mobility Rolling Right: 6: Modified independent (Device/Increase time) Rolling Left: 6: Modified independent (Device/Increase time) Supine to Sit: 6: Modified independent (Device/Increase time) Sit to Supine: 6: Modified independent (Device/Increase time) Transfers Transfers: Yes Sit to Stand: 5: Supervision Stand Pivot Transfers: 5: Supervision Locomotion  Ambulation Ambulation: Yes Ambulation/Gait Assistance: 5: Supervision Ambulation Distance (Feet): 150 Feet Assistive device: Straight cane;Other (Comment) (intermittent use of rail in hall) Gait Gait: Yes Gait Pattern: Impaired Gait Pattern: Decreased step length - left;Poor foot clearance - left Stairs / Additional Locomotion Stairs: Yes Stairs Assistance: 5:  Supervision Stairs Assistance Details: Verbal cues for safe use of DME/AE;Verbal cues for precautions/safety;Verbal cues for gait pattern Stair Management Technique: One rail Right Number of Stairs: 12 Height of Stairs: 6 Ramp: 5: Supervision (with SPC and rail ) Curb: 5: Supervision Wheelchair Mobility Wheelchair Mobility: No  Trunk/Postural Assessment  Cervical Assessment Cervical Assessment: Within Psychologist, forensic  Assessment Thoracic Assessment: Within Functional Limits Postural Control Postural Control: Deficits on evaluation  Balance Dynamic Sitting Balance Dynamic Sitting - Balance Support: No upper extremity supported Dynamic Sitting - Level of Assistance: 6: Modified independent (Device/Increase time) Static Standing Balance Static Standing - Balance Support: No upper extremity supported Static Standing - Level of Assistance: 6: Modified independent (Device/Increase time) Dynamic Standing Balance Dynamic Standing - Balance Support: No upper extremity supported Dynamic Standing - Level of Assistance: 5: Stand by assistance Dynamic Standing - Balance Activities: Reaching for objects Extremity Assessment  RUE Assessment RUE Assessment: Within Functional Limits LUE Assessment LUE Assessment: Within Functional Limits RLE Assessment RLE Assessment: Within Functional Limits LLE Assessment LLE Assessment: Within Functional Limits LLE Strength LLE Overall Strength Comments: 4+/5 proximal to distal    See Function Navigator for Current Functional Status.  Lorie Phenix 04/10/2017, 3:04 PM

## 2017-04-10 NOTE — Progress Notes (Signed)
Social Work Patient ID: [name], male   DOB: Jan 19, 1941, 76 y.o.   MRN: [id number]   Met with pt and daughter yesterday to review team conference (interpreter present).  Aware and agreeable with targeted d/c 7/19 with [place] to resume with [place].  Have arranged for interpreter to be here at 11:00 tomorrow am to review d/c instructions with PA and pt/family.    Spirit Wernli, LCSW

## 2017-04-10 NOTE — Progress Notes (Signed)
Occupational Therapy Session Note  Patient Details  Name: Allie Dimmerurna Marrazzo MRN: [id number] Date of Birth: 11/26/1940  Today's Date: 04/10/2017 OT Individual Time: 1100-1139 OT Individual Time Calculation (min): 39 min  Pt missed 21 mins skilled OT services 2/2 fatigue  Short Term Goals: Week 1:  OT Short Term Goal 1 (Week 1): STG=LTG 2/2 ELOS  Skilled Therapeutic Interventions/Progress Updates:    Pt in bathroom completing dressing tasks upon arrival.  Interpreter present. Pt's grandson present and reported that pt completed bathing/dressing tasks without assistance and would not allow family to observe.  Reeducated grandson on recommendation for supervision.  Pt amb from bathroom with SPC and close supervision by grandson.  Pt's daughter arrived and continued education and recommendations.  Pt's daughter verbalized recommendation for 24/7 supervision and close supervision when pt ambulates.  Discussed strategies for visual/L inattention deficits.  Pt's daughter verbalized recommendations.    Therapy Documentation Precautions:  Precautions Precautions: Fall Precaution Comments: left inattention Restrictions Weight Bearing Restrictions: No Pain: Pain Assessment Pain Assessment: No/denies pain  See Function Navigator for Current Functional Status.   Therapy/Group: Individual Therapy  Rich BraveLanier, Pasquale Matters [place] 04/10/2017, 11:44 AM

## 2017-04-11 LAB — URINE CULTURE: Culture: <10000 — AB

## 2017-04-11 MED ORDER — CLOPIDOGREL BISULFATE 75 MG PO TABS: 75.0000 mg | ORAL_TABLET | Freq: Every day | ORAL | 0 refills | Status: DC

## 2017-04-11 MED ORDER — GLIMEPIRIDE 4 MG PO TABS: 2.0000 mg | ORAL_TABLET | Freq: Every day | ORAL | 0 refills | Status: DC

## 2017-04-11 MED ORDER — ONETOUCH ULTRASOFT LANCETS MISC: 5 refills | Status: DC

## 2017-04-11 MED ORDER — ATORVASTATIN CALCIUM 80 MG PO TABS: 80.0000 mg | ORAL_TABLET | Freq: Every day | ORAL | 0 refills | Status: DC

## 2017-04-11 MED ORDER — CITALOPRAM HYDROBROMIDE 10 MG PO TABS: 10.0000 mg | ORAL_TABLET | Freq: Every day | ORAL | 0 refills | Status: DC

## 2017-04-11 NOTE — Progress Notes (Addendum)
Social Work  Discharge Note  The overall goal for the admission was met for:   Discharge location: Yes - home with daughter and her family  Length of Stay: Yes - 6 days  Discharge activity level: Yes - supervision  Home/community participation: Yes  Services provided included: MD, RD, PT, OT, SLP, RN, TR, Pharmacy, SW and Chief Financial Officer Services: Medicaid  Follow-up services arranged: Home Health: RN, PT, OT, ST via [place], DME: 16x16 lightweight w/c, cushion and rolling walker via [place] and Patient/Family has no preference for HH/DME agencies  Comments (or additional information): per daughter request, I did submit a request for additional PCS aide hours in the home, however, was informed that he is receiving the maximum hours allowed by Medicaid.  Patient/Family verbalized understanding of follow-up arrangements: Yes  Individual responsible for coordination of the follow-up plan: daughter  Confirmed correct DME delivered: Lennart Pall 04/11/2017    Asberry Lascola

## 2017-04-11 NOTE — Progress Notes (Signed)
[place] PHYSICAL MEDICINE & REHABILITATION     PROGRESS NOTE    Subjective/Complaints: No new issues. Sitting up in bed.   ROS: pt denies nausea, vomiting, diarrhea, cough, shortness of breath or chest pain     Objective: Vital Signs: Blood pressure (!) 141/81, pulse 69, temperature 98.2 F (36.8 C), temperature source Oral, resp. rate 17, height 5\' 7"  (1.702 m), weight 65 kg (143 lb 4.8 oz), SpO2 97 %. No results found. No results for input(s): WBC, HGB, HCT, PLT in the last 72 hours. No results for input(s): NA, K, CL, GLUCOSE, BUN, CREATININE, CALCIUM in the last 72 hours.  Invalid input(s): CO CBG (last 3)   Recent Labs  04/08/17 1145 04/08/17 1633  GLUCAP 112* 131*    Wt Readings from Last 3 Encounters:  04/05/17 65 kg (143 lb 4.8 oz)  04/02/17 65.1 kg (143 lb 8.3 oz)  03/26/17 65.7 kg (144 lb 14.4 oz)    Physical Exam:  Constitutional: He appears well-developedand well-nourished.  HENT:  Head: Normocephalicand atraumatic.  Mouth/Throat: Oropharynx is clear and moist.  Eyes: Pupils are equal, round, and reactive to light. Right conjunctiva is injected.  Neck: Normal range of motion. Neck supple.  Cardiovascular: RRR without murmur. No JVD    .  Respiratory: CTA Bilaterally without wheezes or rales. Normal effort   GI: Soft. Bowel sounds are normal. He exhibits no distension. There is no tenderness.  Musculoskeletal: He exhibits no edemaor tenderness.  Neurological: He is alert.  Flat affect. More interactive today.  Moves all 4's but demonstrates weakness on left more than right- Senses pain on all 4's.  Skin: Skin is warmand dry.  Psychiatric: His affect is blunt. He is withdrawn.   Assessment/Plan: 1. 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 Physiatrist is providing close team supervision and 24 hour  management of active medical problems listed below. Physiatrist and rehab team continue to assess barriers to discharge/monitor patient progress toward functional and medical goals.  Function:  Bathing Bathing position Bathing activity did not occur:  (simulated) Position: Shower  Bathing parts Body parts bathed by patient: Right arm, Left arm, Chest, Abdomen, Front perineal area, Buttocks, Right upper leg, Left upper leg, Right lower leg, Left lower leg (per family report) Body parts bathed by helper: Back  Bathing assist Assist Level: Supervision or verbal cues (per family report)      Upper Body Dressing/Undressing Upper body dressing   What is the patient wearing?: Pull over shirt/dress     Pull over shirt/dress - Perfomed by patient: Thread/unthread right sleeve, Thread/unthread left sleeve, Put head through opening, Pull shirt over trunk          Upper body assist Assist Level: Supervision or verbal cues      Lower Body Dressing/Undressing Lower body dressing   What is the patient wearing?: Underwear, Pants, Non-skid slipper socks Underwear - Performed by patient: Thread/unthread right underwear leg, Thread/unthread left underwear leg, Pull underwear up/down (per family report)   Pants- Performed by patient: Thread/unthread right pants leg, Thread/unthread left pants leg, Pull pants up/down (per family report)   Non-skid slipper socks- Performed by patient: Don/doff right sock, Don/doff left sock                    Lower body assist Assist for lower body dressing: Supervision or verbal cues  Toileting Toileting   Toileting steps completed by patient: Adjust clothing prior to toileting, Performs perineal hygiene, Adjust clothing after toileting (per family report)      Toileting assist Assist level: Supervision or verbal cues   Transfers Chair/bed transfer   Chair/bed transfer method: Ambulatory, Stand pivot Chair/bed transfer assist level: Supervision  or verbal cues Chair/bed transfer assistive device: Armrests, Patent attorney     Max distance: 154ft  Assist level: Supervision or Careers adviser activity did not occur: N/A        Cognition Comprehension Comprehension assist level: Understands basic 75 - 89% of the time/ requires cueing 10 - 24% of the time  Expression Expression assist level: Expresses basic needs/ideas: With extra time/assistive device  Social Interaction Social Interaction assist level: Interacts appropriately 50 - 74% of the time - May be physically or verbally inappropriate.  Problem Solving Problem solving assist level: Solves basic 50 - 74% of the time/requires cueing 25 - 49% of the time  Memory Memory assist level: Recognizes or recalls 50 - 74% of the time/requires cueing 25 - 49% of the time   Medical Problem List and Plan: 1. Functional deficits and left hemiparesissecondary to right MCA infarct/recent UTI -continue CIR  -pt showing gains and is approaching goals  -dc home today  -Patient to see Rehab MD in the office for transitional care encounter in 1-2 weeks.  2. DVT Prophylaxis/Anticoagulation: Pharmaceutical: Lovenox 3. Pain Management: N/A 4. Mood: daughter agrees patient is depressed  -initiated low dose celexa at hs.  5. Neuropsych: This patient is notcapable of making decisions on hisown behalf. 6. Skin/Wound Care: routine pressure relief measures.  7. Fluids/Electrolytes/Nutrition: Encourage po intake. On dysphagia 1, thin liquids.  8. Pyuria: ceftriaxone completed 9. Low B 12 levels: Low normal levels supplemented by Im injections.  10. Acute renal failure: Improved with hydration.  11. Dyslipidemia: On Lipitor.   LOS (Days) 6 A FACE TO FACE EVALUATION WAS PERFORMED  Ranelle Oyster, MD 04/11/2017 9:17 AM

## 2017-04-11 NOTE — Progress Notes (Signed)
Discharged to home accompanied by daughter. Discharge info given to patient and dtr by Marissa NestlePam Love [facility] with interpreter. No questions noted wheelchair and walker for home.[name]. Roisin Mones, Binnie Raileborah B

## 2017-04-12 ENCOUNTER — Telehealth: Payer: Self-pay | Admitting: *Deleted

## 2017-04-12 ENCOUNTER — Other Ambulatory Visit: Payer: Self-pay | Admitting: *Deleted

## 2017-04-12 NOTE — Patient Outreach (Signed)
[facility] Care Management  04/12/2017  [name] 10/17/1940 [id number]  EMMI-Stroke referral: Red alert for day # 13, 04/11/2017 Reason: Bradley FlesherWent to follow up appointment/scheduled appointment-no:  Telephone call to patient; spoke with daughter [name] who is caregiver who advises that she takes calls for patient who does not speak [place]. (history of confusion & altered mental status with previous hospital stay with dx of stroke per medical record review).   Daughter/caregiver voices that patient has not attended appointment but appointment has been scheduled for 8/1 with primary care provide. States appointment with neurologist has also been set up. Daughter states she will take patient to MD appointments.   Daughter/caregiver voices that patient is never left alone & that entire family participates in his care. States she manages medications & makes sure patient gets medication as ordered.  States  [facility] services with patient. Voices patient is using walker but is unsteady so someone assists him always.  Strokes symptoms reviewed. Daughter voices understanding & knows importance of getting emergency medical assistance.Marland Kitchen.   EMMI call completed. No further concerns.  Plan: Will send to care management assistant to close out.   Colleen CanLinda Javonn Gauger, RN BSN CCM Care Management Coordinator [facility]  [phone]

## 2017-04-12 NOTE — Telephone Encounter (Signed)
Transitional Care call-I spoke with Sita, his daughter   1. Are you/is patient experiencing any problems since coming home? Are there any questions regarding any aspect of care?NO 2. Are there any questions regarding medications administration/dosing? Are meds being taken as prescribed? Patient should review meds with caller to confirm YES 3. Have there been any falls?NO 4. Has Home Health been to the house and/or have they contacted you? If not, have you tried to contact them? Can we help you contact them?THEY ARE COMING OUT TODAY 5. Are bowels and bladder emptying properly? Are there any unexpected incontinence issues? If applicable, is patient following bowel/bladder programs?NO PROBLEMS 6. Any fevers, problems with breathing, unexpected pain?NO 7. Are there any skin problems or new areas of breakdown?NO 8. Has the patient/family member arranged specialty MD follow up (ie cardiology/neurology/renal/surgical/etc)?  Can we help arrange?APPT GIVEN FOR DR [id number] 9. Does the patient need any other services or support that we can help arrange?NO 10. Are caregivers following through as expected in assisting the patient?YES 11. Has the patient quit smoking, drinking alcohol, or using drugs as recommended?N/A  Appointment Tuesday 04/16/17 arrive 11:20 for 11:40 appt with Dr Riley KillSwartz Address reviewed [address]

## 2017-04-16 ENCOUNTER — Encounter: Payer: Self-pay | Admitting: Physical Medicine & Rehabilitation

## 2017-04-16 ENCOUNTER — Encounter: Payer: Medicare Other | Attending: Physical Medicine & Rehabilitation | Admitting: Physical Medicine & Rehabilitation

## 2017-04-16 VITALS — BP 152/90 | HR 72

## 2017-04-16 DIAGNOSIS — R1312 Dysphagia, oropharyngeal phase: Secondary | ICD-10-CM

## 2017-04-16 DIAGNOSIS — F329 Major depressive disorder, single episode, unspecified: Secondary | ICD-10-CM | POA: Diagnosis not present

## 2017-04-16 DIAGNOSIS — I10 Essential (primary) hypertension: Secondary | ICD-10-CM | POA: Insufficient documentation

## 2017-04-16 DIAGNOSIS — N390 Urinary tract infection, site not specified: Secondary | ICD-10-CM | POA: Diagnosis not present

## 2017-04-16 DIAGNOSIS — Z9889 Other specified postprocedural states: Secondary | ICD-10-CM | POA: Diagnosis not present

## 2017-04-16 DIAGNOSIS — I63511 Cerebral infarction due to unspecified occlusion or stenosis of right middle cerebral artery: Secondary | ICD-10-CM | POA: Insufficient documentation

## 2017-04-16 DIAGNOSIS — E1165 Type 2 diabetes mellitus with hyperglycemia: Secondary | ICD-10-CM | POA: Diagnosis not present

## 2017-04-16 DIAGNOSIS — E119 Type 2 diabetes mellitus without complications: Secondary | ICD-10-CM | POA: Insufficient documentation

## 2017-04-16 DIAGNOSIS — G8194 Hemiplegia, unspecified affecting left nondominant side: Secondary | ICD-10-CM | POA: Diagnosis not present

## 2017-04-16 MED ORDER — GLUCOSE BLOOD VI STRP: ORAL_STRIP | 12 refills | Status: AC

## 2017-04-16 MED ORDER — ONETOUCH ULTRASOFT LANCETS MISC: 5 refills | Status: AC

## 2017-04-16 NOTE — Patient Instructions (Signed)
.  WORK ON IMPROVING HEEL STRIKE OF LEFT FOOT WHILE WALKING!!!

## 2017-04-16 NOTE — Progress Notes (Signed)
Subjective:    Patient ID: [name], male    DOB: 11/23/1940, 76 y.o.   MRN: [id number]  HPI   [name] is here in follow up of his right MCA infarct. He is still having some numbness on his left arm and leg. He is also a bit depressed. Daughter states that he's doing MUCH better than after last discharge. He is walking with a cane as well as one person a long for close supervision/contact guard assist. He is emptying his bowels and bladder without issue. He remains a little impulsive but hasn't fallen or run into any problems because of such.   His sleep is intact as well as his appetite.   Before the stroke he liked to garden and do jobs around the house. He would make himself a meal on occasion.     Pain Inventory Average Pain 0 Pain Right Now 0 My pain is no pain  In the last 24 hours, has pain interfered with the following? General activity 0 Relation with others 0 Enjoyment of life 0 What TIME of day is your pain at its worst? no pain Sleep (in general) NA  Pain is worse with: no pain Pain improves with: no pain Relief from Meds: 0  Mobility walk with assistance use a cane use a walker do you drive?  no  Function not employed: date last employed .  Neuro/Psych not employed: date last employed .  Prior Studies Any changes since last visit?  no  Physicians involved in your care Any changes since last visit?  no   Family History  Problem Relation Age of Onset  . Diabetes Neg Hx   . Stroke Neg Hx   . Cancer Neg Hx    Social History   Social History  . Marital status: Widowed    Spouse name: N/A  . Number of children: N/A  . Years of education: N/A   Social History Main Topics  . Smoking status: Never Smoker  . Smokeless tobacco: Never Used  . Alcohol use No  . Drug use: No  . Sexual activity: Not on file   Other Topics Concern  . Not on file   Social History Narrative  . No narrative on file   Past Surgical History:  Procedure  Laterality Date  . LAPAROSCOPIC APPENDECTOMY  02/21/2012   Procedure: APPENDECTOMY LAPAROSCOPIC;  Surgeon: Cherylynn RidgesJames O Wyatt, MD;  [facility];  Service: General;  Laterality: N/A;   Past Medical History:  Diagnosis Date  . Diabetes (HCC)   . Dizziness   . Hypertension    bp 152/90  p 72  97%o2 sat  Opioid Risk Score:   Fall Risk Score:  `1  Depression screen PHQ 2/9  Depression screen PHQ 2/9 04/16/2017  Decreased Interest 0  Down, Depressed, Hopeless 0  PHQ - 2 Score 0     Review of Systems  Constitutional: Negative.   HENT: Negative.   Eyes: Negative.   Respiratory: Negative.   Cardiovascular: Negative.   Gastrointestinal: Negative.   Endocrine: Negative.   Genitourinary: Negative.   Musculoskeletal: Negative.   Skin: Negative.   Allergic/Immunologic: Negative.   Neurological: Positive for numbness.  Hematological: Negative.   Psychiatric/Behavioral: Negative.   All other systems reviewed and are negative.      Objective:   Physical Exam  HENT:  Head: Normocephalicand atraumatic.  Mouth/Throat: Oropharynx is clear and moist.  Eyes: Pupils are equal, round, and reactive to light. Right conjunctiva is injected.  Neck: Normal range of motion. Neck supple.  Cardiovascular: RRR without murmur. No JVD   Respiratory: CTA Bilaterally without wheezes or rales. Normal effort   GI: Soft. Bowel sounds are normal. He exhibits no distension. There is no tenderness.  Musculoskeletal: He exhibits no edemaor tenderness.  Neurological: He is alert.  Flat affect. More interactive today.  Moves all 4's but demonstrates weakness on left more than right- Senses pain on all 4's. makes more eye contact Skin: Skin is warmand dry.  Psychiatric: His affect is blunt. He is still  withdrawn.       Assessment & Plan:  1. Functional deficits and left hemiparesissecondary to right MCA infarct/recent UTI -making progress with gait. Some residual left hemisensory  loss with inattention  -ideally outpt therapy would be best, but he's resistant to leave the house for outpt therapies.    -PT, OT, SLP 2. Depression: lexapro  3. History of Pyuria: abx completed. No new symptoms.  4. DM: amaryl .  -wrote rx for lancets and test strips  -educated on importance of glucose control to decrease stroke risk.     Thirty minutes of face to face patient care time were spent during this visit. All questions were encouraged and answered. . Follow up in 2 months.

## 2017-05-29 ENCOUNTER — Encounter: Payer: Self-pay | Admitting: Diagnostic Neuroimaging

## 2017-05-29 ENCOUNTER — Ambulatory Visit (INDEPENDENT_AMBULATORY_CARE_PROVIDER_SITE_OTHER): Payer: Medicare Other | Admitting: Diagnostic Neuroimaging

## 2017-05-29 VITALS — BP 119/78 | HR 91 | Ht 67.0 in | Wt 145.0 lb

## 2017-05-29 DIAGNOSIS — I6330 Cerebral infarction due to thrombosis of unspecified cerebral artery: Secondary | ICD-10-CM | POA: Diagnosis not present

## 2017-05-29 DIAGNOSIS — R269 Unspecified abnormalities of gait and mobility: Secondary | ICD-10-CM

## 2017-05-29 DIAGNOSIS — I638 Other cerebral infarction: Secondary | ICD-10-CM | POA: Diagnosis not present

## 2017-05-29 DIAGNOSIS — R413 Other amnesia: Secondary | ICD-10-CM | POA: Diagnosis not present

## 2017-05-29 DIAGNOSIS — I672 Cerebral atherosclerosis: Secondary | ICD-10-CM | POA: Diagnosis not present

## 2017-05-29 DIAGNOSIS — I6389 Other cerebral infarction: Secondary | ICD-10-CM

## 2017-05-29 DIAGNOSIS — E1149 Type 2 diabetes mellitus with other diabetic neurological complication: Secondary | ICD-10-CM

## 2017-05-29 NOTE — Progress Notes (Signed)
[facility]  PATIENT: [name] DOB: 11/30/1940  REFERRING CLINICIAN: Sylvie FarrierXu / Sethi / stroke hospital team HISTORY FROM: patient, via daughter, and chart review REASON FOR VISIT: new consult    HISTORICAL  CHIEF COMPLAINT:  Chief Complaint  Patient presents with  . NP  [facility]    here with daughter and grandson,  Daughter is interpreter,  Has finished therapy. Has loss of vision R>L    HISTORY OF PRESENT ILLNESS:   76 year old male with diabetes, hypertension, here for evaluation of stroke hospital discharge follow-up.  03/24/17 patient had onset of dizziness and balance difficulty. Symptoms continued for several days. He was brought to hospital on 03/26/17. He reported left-sided weakness, was admitted for stroke workup. Initially patient was noted to have hyperdense left MCA changes on CT but this was felt to be related to atherosclerosis. On MRI patient had multiple right MCA watershed ischemic infarcts. Stroke workup was completed. He was discharged on dual antiplatelet therapy 03/28/17.  Patient returned on spot 04/02/17 due to inability to speak, coughing and difficulty swallowing. He was admitted for further workup. MRI showed extension of right frontoparietal ischemic infarcts. In addition he was diagnosed with dehydration and UTI.  Since discharge, overall stable. Using a wheelchair. Vision is poor. Needs help with bathing, dressing.    REVIEW OF SYSTEMS: Full 14 system review of systems performed and negative with exception of: vision changes weight loss.  ALLERGIES: No Known Allergies  HOME MEDICATIONS: Outpatient Medications Prior to Visit  Medication Sig Dispense Refill  . albuterol (PROVENTIL HFA;VENTOLIN HFA) 108 (90 Base) MCG/ACT inhaler Inhale 2 puffs into the lungs every 6 (six) hours as needed for wheezing or shortness of breath.    Marland Kitchen. aspirin EC 81 MG tablet Take 81 mg by mouth daily.    Marland Kitchen. atorvastatin (LIPITOR) 80 MG tablet Take 1 tablet  (80 mg total) by mouth daily at 6 PM. 30 tablet 0  . citalopram (CELEXA) 10 MG tablet Take 1 tablet (10 mg total) by mouth at bedtime. 30 tablet 0  . clopidogrel (PLAVIX) 75 MG tablet Take 1 tablet (75 mg total) by mouth daily. 30 tablet 0  . fluticasone (FLONASE) 50 MCG/ACT nasal spray Place 2 sprays into both nostrils daily as needed for allergies.  3  . glimepiride (AMARYL) 4 MG tablet Take 0.5 tablets (2 mg total) by mouth daily with breakfast. 30 tablet 0  . glucose blood test strip Use as instructed 100 each 12  . Lancets (ONETOUCH ULTRASOFT) lancets Check blood sugars twice a day 60 each 5  . loratadine (CLARITIN) 10 MG tablet Take 10 mg by mouth daily.     . meclizine (ANTIVERT) 12.5 MG tablet Take 12.5 mg by mouth 3 (three) times daily as needed for dizziness.     No facility-administered medications prior to visit.     PAST MEDICAL HISTORY: Past Medical History:  Diagnosis Date  . Diabetes (HCC)   . Dizziness   . Hypertension     PAST SURGICAL HISTORY: Past Surgical History:  Procedure Laterality Date  . LAPAROSCOPIC APPENDECTOMY  02/21/2012   Procedure: APPENDECTOMY LAPAROSCOPIC;  Surgeon: Cherylynn RidgesJames O Wyatt, MD;  [facility];  Service: General;  Laterality: N/A;    FAMILY HISTORY: Family History  Problem Relation Age of Onset  . Diabetes Neg Hx   . Stroke Neg Hx   . Cancer Neg Hx     SOCIAL HISTORY:  Social History   Social History  . Marital status:  Widowed    Spouse name: N/A  . Number of children: N/A  . Years of education: N/A   Occupational History  . Not on file.   Social History Main Topics  . Smoking status: Never Smoker  . Smokeless tobacco: Never Used  . Alcohol use No  . Drug use: No  . Sexual activity: Not on file   Other Topics Concern  . Not on file   Social History Narrative   Lives in apt with 2 daughters who care for him.  He drinks tea in am.       PHYSICAL EXAM  GENERAL EXAM/CONSTITUTIONAL: Vitals:  Vitals:   05/29/17  0835  BP: 119/78  Pulse: 91  Weight: 145 lb (65.8 kg)  Height: 5\' 7"  (1.702 m)     Body mass index is 22.71 kg/m.  No exam data present  Patient is in no distress; well developed, nourished and groomed; neck is supple  CARDIOVASCULAR:  Examination of carotid arteries is normal; no carotid bruits  Regular rate and rhythm, no murmurs  Examination of peripheral vascular system by observation and palpation is normal  EYES:  Ophthalmoscopic exam of optic discs and posterior segments is normal; no papilledema or hemorrhages  MUSCULOSKELETAL:  Gait, strength, tone, movements noted in Neurologic exam below  NEUROLOGIC: MENTAL STATUS:  No flowsheet data found.  awake, alert, oriented to person; DOES NOT KNOW MONTH, DATE OR YEAR; "[place]"; "HOSPITAL"  DECR RECENT MEMORY  DECR attention and concentration  DECR FLUENCY; comprehension intact, naming intact  fund of knowledge appropriate  CRANIAL NERVE:   2nd - no papilledema on fundoscopic exam  2nd, 3rd, 4th, 6th - pupils equal and reactive to light, visual fields --> DECR IN LEFT FIELD, RIGHT GAZE PREFERENCE, extraocular muscles intact, no nystagmus  5th - facial sensation symmetric  7th - facial strength symmetric  8th - hearing intact  9th - palate elevates symmetrically, uvula midline  11th - shoulder shrug symmetric  12th - tongue protrusion midline  MOTOR:   normal bulk and tone  NO PRONATOR DRIFT  BUE 4  RIGHT LEG 3  LEFT LEG 2  SENSORY:   normal and symmetric to light touch, vibration  DECR TEMP IN LEFT HAND  COORDINATION:   finger-nose-finger, fine finger movements SLOW  REFLEXES:   deep tendon reflexes TRACE and symmetric  GAIT/STATION:   IN WHEEL CHAIR; CANNOT STAND UNASSISTED    DIAGNOSTIC DATA (LABS, IMAGING, TESTING) - I reviewed patient records, labs, notes, testing and imaging myself where available.  Lab Results  Component Value Date   WBC 5.5  04/06/2017   HGB 14.9 04/06/2017   HCT 42.0 04/06/2017   MCV 92.3 04/06/2017   PLT 135 (L) 04/06/2017      Component Value Date/Time   NA 140 04/06/2017 0632   K 3.5 04/06/2017 0632   CL 106 04/06/2017 0632   CO2 22 04/06/2017 0632   GLUCOSE 89 04/06/2017 0632   BUN 12 04/06/2017 0632   CREATININE 1.06 04/06/2017 0632   CALCIUM 9.0 04/06/2017 0632   PROT 7.0 04/06/2017 0632   ALBUMIN 3.4 (L) 04/06/2017 0632   AST 67 (H) 04/06/2017 0632   ALT 61 04/06/2017 0632   ALKPHOS 52 04/06/2017 0632   BILITOT 1.9 (H) 04/06/2017 0632   GFRNONAA >60 04/06/2017 0632   GFRAA >60 04/06/2017 0632   Lab Results  Component Value Date   CHOL 187 03/27/2017   HDL 42 03/27/2017   LDLCALC 126 (H) 03/27/2017  TRIG 97 03/27/2017   CHOLHDL 4.5 03/27/2017   Lab Results  Component Value Date   HGBA1C 5.6 03/27/2017   Lab Results  Component Value Date   VITAMINB12 236 03/27/2017   Lab Results  Component Value Date   TSH 9.910 (H) 03/27/2017    03/26/17 CT head [I reviewed images myself and agree with interpretation. -VRP]  - No intracranial hemorrhage. - Slightly hyperdense left middle cerebral artery M2 branch possibly related to acute thrombosis versus atherosclerotic changes. No other CT evidence of large acute infarct. -  Prominent chronic microvascular changes. This limits evaluation for detection of small or moderate-size infarct. - Global atrophy.  03/27/17 MRI brain / MRA head [I reviewed images myself and agree with interpretation. -VRP]  1. Right M1 occlusion with comparatively mild downstream white matter infarcts, deep watershed pattern. 2. Advanced intracranial atherosclerosis with moderate bilateral ICA and proximal basilar stenosis. High-grade left MCA bifurcation and bilateral P2 segment stenoses. 3. Advanced chronic small vessel ischemia in the cerebral white matter.  04/02/17 MRI brain [I reviewed images myself and agree with interpretation. -VRP]  - Limited single  sequence MRI of the head: Further propagation of acute RIGHT frontoparietal/MCA territory infarct.  03/27/17 carotid u/s - The vertebral arteries appear patent with antegrade flow. - Findings consistent with a 1- 39 percent stenosis involving the right internal carotid artery and the left internal carotid artery.  03/27/17 TTE  - Normal LV systolic function; mild diastolic dysfunction; mild LVH; mildly dilated aortic root (4.1 cm); sclerotic aortic valve with mild AI.  04/03/17 EEG 1. Structural or physiologic abnormality in the right temporal region. 2. Diffuse cerebral dysfunction that is non-specific in etiology and can be seen with hypoxic/ischemic injury, toxic/metabolic encephalopathies, neurodegenerative disorders, or medication effect.  -  The absence of epileptiform discharges does not rule out a clinical diagnosis of epilepsy.  Clinical correlation is advised.      ASSESSMENT AND PLAN  76 y.o. year old male here with stroke due to intracranial atherosclerosis, hypertension, diabetes.   Dx:  1. Cerebrovascular accident (CVA) due to other mechanism (HCC)   2. Intracranial atherosclerosis   3. Gait difficulty   4. Memory loss   5. Type 2 diabetes mellitus with other neurologic complication, without long-term current use of insulin (HCC)      PLAN:  STROKE PREVENTION (new problem, no additional workup) - continue aspirin 81 + plavix 75 x 3 months; then may stop aspirin on ~ 06/28/17 - continue diabetes treatment - continue atorvastatin  Return if symptoms worsen or fail to improve, for return to PCP.  I reviewed images, labs, notes, records myself. I summarized findings and reviewed with patient and daughter, for this high risk condition (stroke) requiring high complexity decision making.     Suanne Marker, MD 05/29/2017, 9:03 AM Certified in Neurology, Neurophysiology and Neuroimaging  [facility] [address] [place], [place]  [address] [phone]

## 2017-05-29 NOTE — Patient Instructions (Signed)
-   continue aspirin 81 + plavix 75 x 3 months; then may stop aspirin on ~ 06/28/17  - continue diabetes treatment  - continue atorvastatin

## 2017-06-24 ENCOUNTER — Ambulatory Visit: Payer: Medicaid Other | Admitting: Physical Medicine & Rehabilitation

## 2017-06-24 ENCOUNTER — Encounter: Payer: Self-pay | Admitting: Physical Medicine & Rehabilitation

## 2017-06-24 ENCOUNTER — Encounter: Payer: Medicare Other | Attending: Physical Medicine & Rehabilitation | Admitting: Physical Medicine & Rehabilitation

## 2017-06-24 VITALS — BP 131/84 | HR 73

## 2017-06-24 DIAGNOSIS — I10 Essential (primary) hypertension: Secondary | ICD-10-CM | POA: Insufficient documentation

## 2017-06-24 DIAGNOSIS — I63511 Cerebral infarction due to unspecified occlusion or stenosis of right middle cerebral artery: Secondary | ICD-10-CM | POA: Diagnosis not present

## 2017-06-24 DIAGNOSIS — F329 Major depressive disorder, single episode, unspecified: Secondary | ICD-10-CM | POA: Diagnosis not present

## 2017-06-24 DIAGNOSIS — H538 Other visual disturbances: Secondary | ICD-10-CM | POA: Diagnosis not present

## 2017-06-24 DIAGNOSIS — G8194 Hemiplegia, unspecified affecting left nondominant side: Secondary | ICD-10-CM

## 2017-06-24 DIAGNOSIS — N390 Urinary tract infection, site not specified: Secondary | ICD-10-CM | POA: Insufficient documentation

## 2017-06-24 DIAGNOSIS — Z9889 Other specified postprocedural states: Secondary | ICD-10-CM | POA: Insufficient documentation

## 2017-06-24 DIAGNOSIS — I6330 Cerebral infarction due to thrombosis of unspecified cerebral artery: Secondary | ICD-10-CM

## 2017-06-24 DIAGNOSIS — E119 Type 2 diabetes mellitus without complications: Secondary | ICD-10-CM | POA: Diagnosis not present

## 2017-06-24 NOTE — Addendum Note (Signed)
Addended by: Faith Rogue T on: 06/24/2017 09:54 AM   Modules accepted: Orders

## 2017-06-24 NOTE — Patient Instructions (Signed)
NEEDS EYE ASSESSMENT  NEEDS CONTROL AND REGULAR CHECKS OF BLOOD PRESSURE AND BLOOD SUGARS. IT'S IMPORTANT TO CONTROL THESE TO PREVENT ANOTHER STROKE!!!

## 2017-06-24 NOTE — Progress Notes (Signed)
Subjective:    Patient ID: [name], male    DOB: 06/17/1941, 76 y.o.   MRN: [id number]  HPI [name] is here in follow up of his right MCA infarct. He is still dealing with left sided weakness related to the stroke which frustrates him a bit. He still complains of visual problems as well with blurred vision for object far>near. He is also having intermittent dizziness. I asked family if they are checking bp's at home, and they're not. They are checking sugars but not consistently.  He's drinking a lot of juices because he doesn't like the taste of other drinks, and to hide the taste of his meds  He hasn't fallen. He's using a cane for balance at home. Bowels and bladder ar moving well.   Pain Inventory Average Pain 0 Pain Right Now 0 My pain is no pain  In the last 24 hours, has pain interfered with the following? General activity 0 Relation with others 0 Enjoyment of life 0 What TIME of day is your pain at its worst? no pain Sleep (in general) Good  Pain is worse with: no pain Pain improves with: no pain Relief from Meds: no pain  Mobility walk without assistance walk with assistance use a cane ability to climb steps?  yes do you drive?  no use a wheelchair  Function I need assistance with the following:  bathing, household duties and shopping  Neuro/Psych weakness numbness  Prior Studies Any changes since last visit?  no  Physicians involved in your care Any changes since last visit?  no   Family History  Problem Relation Age of Onset  . Diabetes Neg Hx   . Stroke Neg Hx   . Cancer Neg Hx    Social History   Social History  . Marital status: Widowed    Spouse name: N/A  . Number of children: N/A  . Years of education: N/A   Social History Main Topics  . Smoking status: Never Smoker  . Smokeless tobacco: Never Used  . Alcohol use No  . Drug use: No  . Sexual activity: Not Asked   Other Topics Concern  . None   Social History Narrative   Lives in apt with 2 daughters who care for him.  He drinks tea in am.     Past Surgical History:  Procedure Laterality Date  . LAPAROSCOPIC APPENDECTOMY  02/21/2012   Procedure: APPENDECTOMY LAPAROSCOPIC;  Surgeon: Cherylynn Ridges, MD;  Location: [facility] OR;  Service: General;  Laterality: N/A;   Past Medical History:  Diagnosis Date  . Diabetes (HCC)   . Dizziness   . Hypertension    BP 131/84 (BP Location: Right Arm, Patient Position: Sitting, Cuff Size: Normal)   Pulse 73   SpO2 96%   Opioid Risk Score:   Fall Risk Score:  `1  Depression screen PHQ 2/9  Depression screen PHQ 2/9 04/16/2017  Decreased Interest 0  Down, Depressed, Hopeless 0  PHQ - 2 Score 0    Review of Systems  HENT: Negative.   Eyes: Negative.   Respiratory: Positive for cough.   Cardiovascular: Negative.   Gastrointestinal: Negative.   Endocrine:       High blood sugar  Genitourinary: Negative.   Musculoskeletal: Negative.   Skin: Negative.   Allergic/Immunologic: Negative.   Neurological: Positive for weakness and numbness.  Hematological: Negative.   Psychiatric/Behavioral: Negative.   All other systems reviewed and are negative.      Objective:  Physical Exam   HENT: mucosa pink Head: Normocephalicand atraumatic.  Mouth/Throat: Oropharynx is clear and moist.  Eyes: Pupils are equal, round, and reactive to light. Right conjunctiva is injected.  Neck: Normal range of motion. Neck supple.  Cardiovascular:RRR without murmur. No JVD   Respiratory: CTA Bilaterally without wheezes or rales. Normal effort  GI: Soft. Bowel sounds are normal. He exhibits no distension. There is no tenderness.  Musculoskeletal: He exhibits no edemaor tenderness.  Neurological: He is alert.  Flat affect. Follows simple commands. Impaired vision near and far. No focal field cut. Strength nearly symmetrical now, 5/5 right 4+ to 5/5 left. Senses pain equally. Shuffling gait with straight cane. Transfers  independently Skin: Skin is warmand dry.  Psychiatric: His affect is blunt. He is still  withdrawn.       Assessment & Plan:  1. 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            -HEP continue  -supervision for safety when up ambulating.  2. Depression: lexapro  3. Visual acuity---recommend optometry referral. WILL make a referral  -don't believe this is stroke related 4. DM: amaryl .           -reiterated need for close glucose control  15 minutes of face to face patient care time were spent during this visit. All questions were encouraged and answered. . Follow up in 6 months.

## 2017-07-16 ENCOUNTER — Encounter (HOSPITAL_COMMUNITY): Payer: Self-pay | Admitting: Emergency Medicine

## 2017-07-16 ENCOUNTER — Emergency Department (HOSPITAL_COMMUNITY): Payer: Medicare Other

## 2017-07-16 ENCOUNTER — Emergency Department (HOSPITAL_COMMUNITY)
Admission: EM | Admit: 2017-07-16 | Discharge: 2017-07-16 | Disposition: A | Discharge status: Home / Self Care | Payer: Medicare Other | Attending: Emergency Medicine | Admitting: Emergency Medicine

## 2017-07-16 DIAGNOSIS — Z7984 Long term (current) use of oral hypoglycemic drugs: Secondary | ICD-10-CM | POA: Diagnosis not present

## 2017-07-16 DIAGNOSIS — R0789 Other chest pain: Secondary | ICD-10-CM

## 2017-07-16 DIAGNOSIS — Z7982 Long term (current) use of aspirin: Secondary | ICD-10-CM | POA: Insufficient documentation

## 2017-07-16 DIAGNOSIS — J209 Acute bronchitis, unspecified: Secondary | ICD-10-CM

## 2017-07-16 DIAGNOSIS — R079 Chest pain, unspecified: Secondary | ICD-10-CM | POA: Diagnosis present

## 2017-07-16 DIAGNOSIS — E119 Type 2 diabetes mellitus without complications: Secondary | ICD-10-CM | POA: Insufficient documentation

## 2017-07-16 DIAGNOSIS — R05 Cough: Secondary | ICD-10-CM | POA: Diagnosis not present

## 2017-07-16 DIAGNOSIS — Z79899 Other long term (current) drug therapy: Secondary | ICD-10-CM | POA: Diagnosis not present

## 2017-07-16 DIAGNOSIS — I10 Essential (primary) hypertension: Secondary | ICD-10-CM | POA: Insufficient documentation

## 2017-07-16 DIAGNOSIS — Z8673 Personal history of transient ischemic attack (TIA), and cerebral infarction without residual deficits: Secondary | ICD-10-CM | POA: Insufficient documentation

## 2017-07-16 LAB — CBC
HCT: 36.7 % — ABNORMAL LOW (ref 39.0–52.0)
Hemoglobin: 12.3 g/dL — ABNORMAL LOW (ref 13.0–17.0)
MCH: 30.8 pg (ref 26.0–34.0)
MCHC: 33.5 g/dL (ref 30.0–36.0)
MCV: 91.8 fL (ref 78.0–100.0)
PLATELETS: 169 10*3/uL (ref 150–400)
RBC: 4 MIL/uL — ABNORMAL LOW (ref 4.22–5.81)
RDW: 13.8 % (ref 11.5–15.5)
WBC: 10 10*3/uL (ref 4.0–10.5)

## 2017-07-16 LAB — BASIC METABOLIC PANEL
Anion gap: 9 (ref 5–15)
BUN: 9 mg/dL (ref 6–20)
CO2: 27 mmol/L (ref 22–32)
Calcium: 9.1 mg/dL (ref 8.9–10.3)
Chloride: 104 mmol/L (ref 101–111)
Creatinine, Ser: 0.83 mg/dL (ref 0.61–1.24)
GFR calc Af Amer: >60 mL/min (ref >60)
GLUCOSE: 100 mg/dL — AB (ref 65–99)
POTASSIUM: 4.3 mmol/L (ref 3.5–5.1)
Sodium: 140 mmol/L (ref 135–145)

## 2017-07-16 LAB — I-STAT TROPONIN, ED: TROPONIN I, POC: 0.01 ng/mL (ref 0.00–0.08)

## 2017-07-16 MED ORDER — AMOXICILLIN-POT CLAVULANATE 875-125 MG PO TABS: 1.0000 | ORAL_TABLET | Freq: Two times a day (BID) | ORAL | 0 refills | Status: DC

## 2017-07-16 MED ORDER — BENZONATATE 100 MG PO CAPS: 100.0000 mg | ORAL_CAPSULE | Freq: Three times a day (TID) | ORAL | 0 refills | Status: DC | PRN

## 2017-07-16 NOTE — ED Provider Notes (Signed)
[facility] EMERGENCY [facility] Provider Note   CSN: [id number] Arrival date & time: 07/16/17  0831     History   Chief Complaint Chief Complaint  Patient presents with  . Cough  . Chest Pain    HPI [name] is a 76 y.o. male.  HPI 76 year old male with a history of diabetes and hypertension with prior stroke presents with chest pain and cough.  The cough started last night and during bad coughing spells he will get an aching chest pain in his right lower chest.  Currently does not have the chest pain.  Last had the chest pain this morning.  No shortness of breath.  The cough is productive but he is not sure what color.  No fevers or sore throat.  No leg swelling.  Daughter has tried NyQuil which seemed to help a little bit last night with the cough.    Past Medical History:  Diagnosis Date  . Diabetes (HCC)   . Dizziness   . Hypertension     Patient Active Problem List   Diagnosis Date Noted  . Blurred vision, bilateral 06/24/2017  . Dyslipidemia 04/10/2017  . 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 Acute ischemic right MCA stroke (HCC) 04/05/2017  . Left hemiparesis (HCC)   . Oropharyngeal dysphagia   . Confusion   . Altered mental state   . CVA (cerebral vascular accident) (HCC) 04/02/2017  . AKI (acute kidney injury) (HCC) 04/02/2017  . DM (diabetes mellitus) (HCC) 04/02/2017  . Elevated transaminase level 04/02/2017  . 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 Diabetic complication (HCC) 03/26/2017  . Dizziness 03/26/2017  . Postop check 03/11/2012  . HYDROCELE, RIGHT 08/28/2010  . NOCTURIA 08/28/2010  . OTH&UNSPEC NONINFECTIOUS GASTROENTERITIS&COLITIS 06/29/2010  . ABDOMINAL PAIN-RLQ 06/29/2010  . ABNORMAL FINDINGS GI TRACT 06/29/2010  . HYPERTENSION, BENIGN ESSENTIAL 05/02/2010  . LEUKOCYTOSIS 04/20/2010  . COLITIS, HX OF 04/20/2010    Past Surgical History:  Procedure Laterality Date  . LAPAROSCOPIC APPENDECTOMY   02/21/2012   Procedure: APPENDECTOMY LAPAROSCOPIC;  Surgeon: Cherylynn RidgesJames O Wyatt, MD;  Location: [facility] OR;  Service: General;  Laterality: N/A;       Home Medications    Prior to Admission medications   Medication Sig Start Date End Date Taking? Authorizing Provider  albuterol (PROVENTIL HFA;VENTOLIN HFA) 108 (90 Base) MCG/ACT inhaler Inhale 2 puffs into the lungs every 6 (six) hours as needed for wheezing or shortness of breath.    [provider]  amoxicillin-clavulanate (AUGMENTIN) 875-125 MG tablet Take 1 tablet by mouth 2 (two) times daily. One po bid x 7 days 07/16/17   Pricilla LovelessGoldston, Mahrukh Seguin, MD  aspirin EC 81 MG tablet Take 81 mg by mouth daily.    [provider]  atorvastatin (LIPITOR) 80 MG tablet Take 1 tablet (80 mg total) by mouth daily at 6 PM. 04/11/17   Love, Evlyn KannerPamela S, PA-C  benzonatate (TESSALON) 100 MG capsule Take 1 capsule (100 mg total) by mouth 3 (three) times daily as needed for cough. 07/16/17   Pricilla LovelessGoldston, Betzayda Braxton, MD  citalopram (CELEXA) 10 MG tablet Take 1 tablet (10 mg total) by mouth at bedtime. 04/11/17   Love, Evlyn KannerPamela S, PA-C  clopidogrel (PLAVIX) 75 MG tablet Take 1 tablet (75 mg total) by mouth daily. 04/11/17   Love, Evlyn KannerPamela S, PA-C  fluticasone (FLONASE) 50 MCG/ACT nasal spray Place 2 sprays into both nostrils daily as needed for allergies. 03/28/17   [provider]  glimepiride (AMARYL) 4 MG tablet  Take 0.5 tablets (2 mg total) by mouth daily with breakfast. 04/11/17   Love, Evlyn Kanner, PA-C  glucose blood test strip Use as instructed 04/16/17   Ranelle Oyster, MD  Lancets [facility] ULTRASOFT) lancets Check blood sugars twice a day 04/16/17   Ranelle Oyster, MD  loratadine (CLARITIN) 10 MG tablet Take 10 mg by mouth daily.  12/10/11   [provider]  losartan-hydrochlorothiazide (HYZAAR) 100-25 MG tablet Take 1 tablet by mouth daily.    [provider]  meclizine (ANTIVERT) 12.5 MG tablet Take 12.5 mg by mouth 3 (three) times daily as  needed for dizziness.    [provider]  metFORMIN (GLUCOPHAGE) 500 MG tablet Take 500 mg by mouth 2 (two) times daily with a meal.    [provider]  traZODone (DESYREL) 50 MG tablet Take 50 mg by mouth at bedtime.    [provider]    Family History Family History  Problem Relation Age of Onset  . Diabetes Neg Hx   . Stroke Neg Hx   . Cancer Neg Hx     Social History Social History  Substance Use Topics  . Smoking status: Never Smoker  . Smokeless tobacco: Never Used  . Alcohol use No     Allergies   Patient has no known allergies.   Review of Systems Review of Systems  Constitutional: Negative for fever.  HENT: Negative for congestion and sore throat.   Respiratory: Positive for cough. Negative for shortness of breath.   Cardiovascular: Positive for chest pain. Negative for leg swelling.  Gastrointestinal: Negative for vomiting.  All other systems reviewed and are negative.    Physical Exam Updated Vital Signs BP (!) 135/98   Pulse 73   Temp (!) 97.4 F (36.3 C) (Oral)   Resp (!) 25   SpO2 100%   Physical Exam  Constitutional: He is oriented to person, place, and time. He appears well-developed and well-nourished.  HENT:  Head: Normocephalic and atraumatic.  Right Ear: External ear normal.  Left Ear: External ear normal.  Nose: Nose normal.  Eyes: Right eye exhibits no discharge. Left eye exhibits no discharge.  Neck: Neck supple.  Cardiovascular: Normal rate, regular rhythm and normal heart sounds.   Pulmonary/Chest: Effort normal and breath sounds normal. He has no wheezes. He exhibits no tenderness.  Slight rales at bilateral bases  Abdominal: Soft. There is no tenderness.  Musculoskeletal: He exhibits no edema.  Neurological: He is alert and oriented to person, place, and time.  Skin: Skin is warm and dry.  Nursing note and vitals reviewed.    ED Treatments / Results  Labs (all labs ordered are listed, but only  abnormal results are displayed) Labs Reviewed  BASIC METABOLIC PANEL - Abnormal; Notable for the following:       Result Value   Glucose, Bld 100 (*)    All other components within normal limits  CBC - Abnormal; Notable for the following:    RBC 4.00 (*)    Hemoglobin 12.3 (*)    HCT 36.7 (*)    All other components within normal limits  I-STAT TROPONIN, ED    EKG  EKG Interpretation  Date/Time:  Tuesday July 16 2017 08:32:30 EDT Ventricular Rate:  71 PR Interval:  164 QRS Duration: 96 QT Interval:  412 QTC Calculation: 447 R Axis:   76 Text Interpretation:  Normal sinus rhythm Normal ECG ST/T changes resolved compared to July 2018 Confirmed by Pricilla Loveless [phone])  on 07/16/2017 11:05:17 AM       Radiology Dg Chest 2 View  Result Date: 07/16/2017 CLINICAL DATA:  Chest pain and productive cough. EXAM: CHEST  2 VIEW COMPARISON:  04/02/2017 and 03/27/2017 FINDINGS: The heart size and mediastinal contours are within normal limits. New bilateral peribronchial thickening with slight accentuation of the interstitial markings at the left lung base. No consolidative infiltrate. No effusions. The visualized skeletal structures are unremarkable. IMPRESSION: New bronchitic changes. Electronically Signed   By: Francene Boyers M.D.   On: 07/16/2017 09:13    Procedures Procedures (including critical care time)  Medications Ordered in ED Medications - No data to display   Initial Impression / Assessment and Plan / ED Course  I have reviewed the triage vital signs and the nursing notes.  Pertinent labs & imaging results that were available during my care of the patient were reviewed by me and considered in my medical decision making (see chart for details).     The patient's chest pain is probably from the coughing.  I think his cough is likely bronchitis.  There are some new peribronchial thickening and thus will cover with antibiotics.  No azithromycin given he is on  citalopram.  Thus will do Augmentin.  Treat cough as well.  I highly doubt ACS, PE, or dissection.  He is not tachypneic or having increased work of breathing on my exam.  Return precautions.  Final Clinical Impressions(s) / ED Diagnoses   Final diagnoses:  Acute bronchitis, unspecified organism  Atypical chest pain    New Prescriptions Discharge Medication List as of 07/16/2017 12:26 PM    START taking these medications   Details  amoxicillin-clavulanate (AUGMENTIN) 875-125 MG tablet Take 1 tablet by mouth 2 (two) times daily. One po bid x 7 days, Starting Tue 07/16/2017, Print    benzonatate (TESSALON) 100 MG capsule Take 1 capsule (100 mg total) by mouth 3 (three) times daily as needed for cough., Starting Tue 07/16/2017, Print         Pricilla Loveless, MD 07/16/17 [phone]

## 2017-07-16 NOTE — ED Triage Notes (Signed)
Pt present to ED from home with family, pt has been having a productive cough with congested for the last 3 days and now having some aching chest pain in the center of his chest.

## 2017-07-16 NOTE — ED Notes (Signed)
Pt and family verbalized understanding of d/c instructions and has no further questions. VSS, NAD.

## 2017-08-01 ENCOUNTER — Encounter (HOSPITAL_COMMUNITY): Payer: Self-pay

## 2017-08-01 ENCOUNTER — Inpatient Hospital Stay (HOSPITAL_COMMUNITY)
Admission: EM | Admit: 2017-08-01 | Discharge: 2017-08-04 | DRG: 064 | Disposition: A | Discharge status: Home Health | Payer: Medicare Other | Attending: Internal Medicine | Admitting: Internal Medicine

## 2017-08-01 ENCOUNTER — Emergency Department (HOSPITAL_COMMUNITY): Payer: Medicare Other

## 2017-08-01 ENCOUNTER — Other Ambulatory Visit: Payer: Self-pay

## 2017-08-01 DIAGNOSIS — R233 Spontaneous ecchymoses: Secondary | ICD-10-CM | POA: Diagnosis present

## 2017-08-01 DIAGNOSIS — G9349 Other encephalopathy: Secondary | ICD-10-CM | POA: Diagnosis present

## 2017-08-01 DIAGNOSIS — Z7984 Long term (current) use of oral hypoglycemic drugs: Secondary | ICD-10-CM | POA: Diagnosis not present

## 2017-08-01 DIAGNOSIS — G936 Cerebral edema: Secondary | ICD-10-CM | POA: Diagnosis present

## 2017-08-01 DIAGNOSIS — E876 Hypokalemia: Secondary | ICD-10-CM | POA: Diagnosis present

## 2017-08-01 DIAGNOSIS — R29703 NIHSS score 3: Secondary | ICD-10-CM | POA: Diagnosis present

## 2017-08-01 DIAGNOSIS — E785 Hyperlipidemia, unspecified: Secondary | ICD-10-CM | POA: Diagnosis present

## 2017-08-01 DIAGNOSIS — E871 Hypo-osmolality and hyponatremia: Secondary | ICD-10-CM | POA: Diagnosis present

## 2017-08-01 DIAGNOSIS — R451 Restlessness and agitation: Secondary | ICD-10-CM

## 2017-08-01 DIAGNOSIS — E119 Type 2 diabetes mellitus without complications: Secondary | ICD-10-CM | POA: Diagnosis present

## 2017-08-01 DIAGNOSIS — R4701 Aphasia: Secondary | ICD-10-CM | POA: Diagnosis present

## 2017-08-01 DIAGNOSIS — R1310 Dysphagia, unspecified: Secondary | ICD-10-CM | POA: Diagnosis present

## 2017-08-01 DIAGNOSIS — I63512 Cerebral infarction due to unspecified occlusion or stenosis of left middle cerebral artery: Secondary | ICD-10-CM | POA: Diagnosis not present

## 2017-08-01 DIAGNOSIS — I639 Cerebral infarction, unspecified: Secondary | ICD-10-CM | POA: Diagnosis not present

## 2017-08-01 DIAGNOSIS — Z7902 Long term (current) use of antithrombotics/antiplatelets: Secondary | ICD-10-CM

## 2017-08-01 DIAGNOSIS — H547 Unspecified visual loss: Secondary | ICD-10-CM | POA: Diagnosis present

## 2017-08-01 DIAGNOSIS — R4182 Altered mental status, unspecified: Secondary | ICD-10-CM | POA: Diagnosis present

## 2017-08-01 DIAGNOSIS — Z8673 Personal history of transient ischemic attack (TIA), and cerebral infarction without residual deficits: Secondary | ICD-10-CM

## 2017-08-01 DIAGNOSIS — I63132 Cerebral infarction due to embolism of left carotid artery: Secondary | ICD-10-CM | POA: Diagnosis not present

## 2017-08-01 DIAGNOSIS — I10 Essential (primary) hypertension: Secondary | ICD-10-CM | POA: Diagnosis present

## 2017-08-01 DIAGNOSIS — Z79899 Other long term (current) drug therapy: Secondary | ICD-10-CM | POA: Diagnosis not present

## 2017-08-01 LAB — COMPREHENSIVE METABOLIC PANEL
ALT: 26 U/L (ref 17–63)
ANION GAP: 11 (ref 5–15)
AST: 39 U/L (ref 15–41)
Albumin: 4 g/dL (ref 3.5–5.0)
Alkaline Phosphatase: 58 U/L (ref 38–126)
BUN: 9 mg/dL (ref 6–20)
CHLORIDE: 100 mmol/L — AB (ref 101–111)
CO2: 22 mmol/L (ref 22–32)
Calcium: 8.7 mg/dL — ABNORMAL LOW (ref 8.9–10.3)
Creatinine, Ser: 0.91 mg/dL (ref 0.61–1.24)
GFR calc non Af Amer: >60 mL/min (ref >60)
Glucose, Bld: 114 mg/dL — ABNORMAL HIGH (ref 65–99)
Potassium: 3.9 mmol/L (ref 3.5–5.1)
SODIUM: 133 mmol/L — AB (ref 135–145)
Total Bilirubin: 1.5 mg/dL — ABNORMAL HIGH (ref 0.3–1.2)
Total Protein: 7.4 g/dL (ref 6.5–8.1)

## 2017-08-01 LAB — DIFFERENTIAL
BASOS PCT: 0 %
Basophils Absolute: 0 10*3/uL (ref 0.0–0.1)
Eosinophils Absolute: 0.1 10*3/uL (ref 0.0–0.7)
Eosinophils Relative: 1 %
Lymphocytes Relative: 19 %
Lymphs Abs: 1.9 10*3/uL (ref 0.7–4.0)
MONO ABS: 1.3 10*3/uL — AB (ref 0.1–1.0)
Monocytes Relative: 14 %
NEUTROS ABS: 6.4 10*3/uL (ref 1.7–7.7)
Neutrophils Relative %: 66 %

## 2017-08-01 LAB — GLUCOSE, CAPILLARY: GLUCOSE-CAPILLARY: 95 mg/dL (ref 65–99)

## 2017-08-01 LAB — CBG MONITORING, ED: GLUCOSE-CAPILLARY: 119 mg/dL — AB (ref 65–99)

## 2017-08-01 LAB — CBC
HCT: 37.1 % — ABNORMAL LOW (ref 39.0–52.0)
Hemoglobin: 13.2 g/dL (ref 13.0–17.0)
MCH: 32 pg (ref 26.0–34.0)
MCHC: 35.6 g/dL (ref 30.0–36.0)
MCV: 90 fL (ref 78.0–100.0)
PLATELETS: 161 10*3/uL (ref 150–400)
RBC: 4.12 MIL/uL — ABNORMAL LOW (ref 4.22–5.81)
RDW: 13.3 % (ref 11.5–15.5)
WBC: 9.8 10*3/uL (ref 4.0–10.5)

## 2017-08-01 LAB — I-STAT TROPONIN, ED: Troponin i, poc: 0 ng/mL (ref 0.00–0.08)

## 2017-08-01 LAB — APTT: APTT: 28 s (ref 24–36)

## 2017-08-01 LAB — PROTIME-INR
INR: 1.06
PROTHROMBIN TIME: 13.8 s (ref 11.4–15.2)

## 2017-08-01 MED ORDER — CITALOPRAM HYDROBROMIDE 10 MG PO TABS
10.0000 mg | ORAL_TABLET | Freq: Every day | ORAL | Status: DC
  Administered 2017-08-03: 10 mg via ORAL
  Filled 2017-08-01: qty 1

## 2017-08-01 MED ORDER — SODIUM CHLORIDE 0.9 % IV BOLUS (SEPSIS)
500.0000 mL | Freq: Once | INTRAVENOUS | Status: AC
  Administered 2017-08-01: 500 mL via INTRAVENOUS

## 2017-08-01 MED ORDER — LORAZEPAM 2 MG/ML IJ SOLN
1.0000 mg | INTRAMUSCULAR | Status: DC | PRN
  Administered 2017-08-01 – 2017-08-03 (×3): 1 mg via INTRAVENOUS
  Filled 2017-08-01 (×2): qty 1

## 2017-08-01 MED ORDER — HALOPERIDOL LACTATE 5 MG/ML IJ SOLN
1.0000 mg | Freq: Once | INTRAMUSCULAR | Status: AC
  Administered 2017-08-01: 1 mg via INTRAVENOUS
  Filled 2017-08-01: qty 1

## 2017-08-01 MED ORDER — TRAZODONE HCL 50 MG PO TABS
50.0000 mg | ORAL_TABLET | Freq: Every day | ORAL | Status: DC
  Administered 2017-08-03: 50 mg via ORAL
  Filled 2017-08-01: qty 1

## 2017-08-01 MED ORDER — ATORVASTATIN CALCIUM 80 MG PO TABS
80.0000 mg | ORAL_TABLET | Freq: Every day | ORAL | Status: DC
  Administered 2017-08-02: 80 mg via ORAL
  Filled 2017-08-01 (×2): qty 1

## 2017-08-01 MED ORDER — ONDANSETRON HCL 4 MG/2ML IJ SOLN: 4.0000 mg | Freq: Four times a day (QID) | INTRAMUSCULAR | Status: DC | PRN

## 2017-08-01 MED ORDER — POLYETHYLENE GLYCOL 3350 17 G PO PACK: 17.0000 g | PACK | Freq: Every day | ORAL | Status: DC | PRN

## 2017-08-01 MED ORDER — TRAZODONE HCL 50 MG PO TABS: 50.0000 mg | ORAL_TABLET | Freq: Every evening | ORAL | Status: DC | PRN

## 2017-08-01 MED ORDER — ACETAMINOPHEN 650 MG RE SUPP: 650.0000 mg | Freq: Four times a day (QID) | RECTAL | Status: DC | PRN

## 2017-08-01 MED ORDER — ALBUTEROL SULFATE HFA 108 (90 BASE) MCG/ACT IN AERS: 2.0000 | INHALATION_SPRAY | Freq: Four times a day (QID) | RESPIRATORY_TRACT | Status: DC | PRN

## 2017-08-01 MED ORDER — HEPARIN SODIUM (PORCINE) 5000 UNIT/ML IJ SOLN
5000.0000 [IU] | Freq: Three times a day (TID) | INTRAMUSCULAR | Status: DC
  Administered 2017-08-02 (×2): 5000 [IU] via SUBCUTANEOUS
  Filled 2017-08-01 (×2): qty 1

## 2017-08-01 MED ORDER — SENNA 8.6 MG PO TABS
1.0000 | ORAL_TABLET | Freq: Two times a day (BID) | ORAL | Status: DC
  Administered 2017-08-02 – 2017-08-04 (×4): 8.6 mg via ORAL
  Filled 2017-08-01 (×4): qty 1

## 2017-08-01 MED ORDER — INSULIN ASPART 100 UNIT/ML ~~LOC~~ SOLN
0.0000 [IU] | Freq: Three times a day (TID) | SUBCUTANEOUS | Status: DC
  Administered 2017-08-02 (×2): 1 [IU] via SUBCUTANEOUS

## 2017-08-01 MED ORDER — ACETAMINOPHEN 325 MG PO TABS: 650.0000 mg | ORAL_TABLET | Freq: Four times a day (QID) | ORAL | Status: DC | PRN

## 2017-08-01 MED ORDER — SODIUM CHLORIDE 0.9 % IV SOLN: 250.0000 mL | INTRAVENOUS | Status: DC | PRN

## 2017-08-01 MED ORDER — SODIUM CHLORIDE 3 % IV SOLN
INTRAVENOUS | Status: DC
  Filled 2017-08-01 (×2): qty 500

## 2017-08-01 MED ORDER — ONDANSETRON HCL 4 MG PO TABS: 4.0000 mg | ORAL_TABLET | Freq: Four times a day (QID) | ORAL | Status: DC | PRN

## 2017-08-01 MED ORDER — SODIUM CHLORIDE 3 % IV SOLN: INTRAVENOUS | Status: DC

## 2017-08-01 MED ORDER — MECLIZINE HCL 12.5 MG PO TABS: 12.5000 mg | ORAL_TABLET | Freq: Three times a day (TID) | ORAL | Status: DC | PRN

## 2017-08-01 MED ORDER — ALBUTEROL SULFATE (2.5 MG/3ML) 0.083% IN NEBU: 2.5000 mg | INHALATION_SOLUTION | RESPIRATORY_TRACT | Status: DC | PRN

## 2017-08-01 MED ORDER — SODIUM CHLORIDE 0.9% FLUSH
3.0000 mL | Freq: Two times a day (BID) | INTRAVENOUS | Status: DC
  Administered 2017-08-01: 10 mL via INTRAVENOUS
  Administered 2017-08-02 – 2017-08-04 (×4): 3 mL via INTRAVENOUS

## 2017-08-01 MED ORDER — SODIUM CHLORIDE 0.9% FLUSH
3.0000 mL | INTRAVENOUS | Status: DC | PRN
  Administered 2017-08-03: 3 mL via INTRAVENOUS
  Filled 2017-08-01: qty 3

## 2017-08-01 MED ORDER — CLOPIDOGREL BISULFATE 75 MG PO TABS
75.0000 mg | ORAL_TABLET | Freq: Every day | ORAL | Status: DC
  Administered 2017-08-02 – 2017-08-04 (×3): 75 mg via ORAL
  Filled 2017-08-01 (×3): qty 1

## 2017-08-01 MED ORDER — GLIMEPIRIDE 2 MG PO TABS: 2.0000 mg | ORAL_TABLET | Freq: Every day | ORAL | Status: DC

## 2017-08-01 NOTE — ED Provider Notes (Signed)
[facility]   CSN: [id number] Arrival date & time: 08/01/17  1314  LEVEL 5 CAVEAT - ALTERED MENTAL STATUS   History   Chief Complaint No chief complaint on file.   HPI [name] is a 76 y.o. male.  HPI  76 year old male with a history of diabetes, hypertension, and prior right MCA stroke presents with altered mental status.  History is taken from the daughter as the patient both does not speak this language but also is altered.  The patient apparently was normal yesterday up until about 1 PM.  At 2 PM he has been confused and been confused since.  The patient has both been speaking nonsense and words they do not understand as well as not remembering stuff he normally would.  He has also been urinating and defecating in different rooms that are not bathrooms.  He had underwear on his head and was trying to go through a wall instead of a door.  It was noted that he has a cough and was recently in the ED for the same.  The cough is improving. No fevers.  Past Medical History:  Diagnosis Date  . Diabetes (HCC)   . Dizziness   . Hypertension     Patient Active Problem List   Diagnosis Date Noted  . Blurred vision, bilateral 06/24/2017  . Dyslipidemia 04/10/2017  . 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 Acute ischemic right MCA stroke (HCC) 04/05/2017  . Left hemiparesis (HCC)   . Oropharyngeal dysphagia   . Confusion   . Altered mental state   . Acute CVA / Lt MCA 08/01/2017, Rt MCA 04/02/2017 04/02/2017  . AKI (acute kidney injury) (HCC) 04/02/2017  . DM (diabetes mellitus) (HCC) 04/02/2017  . Elevated transaminase level 04/02/2017  . 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 Diabetic complication (HCC) 03/26/2017  . Dizziness 03/26/2017  . Postop check 03/11/2012  . HYDROCELE, RIGHT 08/28/2010  . NOCTURIA 08/28/2010  . OTH&UNSPEC NONINFECTIOUS GASTROENTERITIS&COLITIS 06/29/2010  . ABDOMINAL PAIN-RLQ  06/29/2010  . ABNORMAL FINDINGS GI TRACT 06/29/2010  . HYPERTENSION, BENIGN ESSENTIAL 05/02/2010  . LEUKOCYTOSIS 04/20/2010  . COLITIS, HX OF 04/20/2010    History reviewed. No pertinent surgical history.     Home Medications    Prior to Admission medications   Medication Sig Start Date End Date Taking? Authorizing Provider  albuterol (PROVENTIL HFA;VENTOLIN HFA) 108 (90 Base) MCG/ACT inhaler Inhale 2 puffs into the lungs every 6 (six) hours as needed for wheezing or shortness of breath.   Yes [provider]  atorvastatin (LIPITOR) 80 MG tablet Take 1 tablet (80 mg total) by mouth daily at 6 PM. 04/11/17  Yes Love, Evlyn KannerPamela S, PA-C  citalopram (CELEXA) 10 MG tablet Take 1 tablet (10 mg total) by mouth at bedtime. 04/11/17  Yes Love, Evlyn KannerPamela S, PA-C  clopidogrel (PLAVIX) 75 MG tablet Take 1 tablet (75 mg total) by mouth daily. 04/11/17  Yes Love, Evlyn KannerPamela S, PA-C  glimepiride (AMARYL) 4 MG tablet Take 0.5 tablets (2 mg total) by mouth daily with breakfast. 04/11/17  Yes Love, Evlyn Kanneramela S, PA-C  loratadine (CLARITIN) 10 MG tablet Take 10 mg by mouth daily.  12/10/11  Yes [provider]  losartan-hydrochlorothiazide (HYZAAR) 100-12.5 MG tablet Take 1 tablet by mouth daily.   Yes [provider]  meclizine (ANTIVERT) 12.5 MG tablet Take 12.5 mg by mouth 3 (three) times daily as needed for dizziness.   Yes [provider]  metFORMIN (GLUCOPHAGE) 500  MG tablet Take 500 mg by mouth 2 (two) times daily with a meal.   Yes [provider]  traZODone (DESYREL) 50 MG tablet Take 50 mg by mouth at bedtime.   Yes [provider]  amoxicillin-clavulanate (AUGMENTIN) 875-125 MG tablet Take 1 tablet by mouth 2 (two) times daily. One po bid x 7 days Patient not taking: Reported on 08/01/2017 07/16/17   Pricilla Loveless, MD  benzonatate (TESSALON) 100 MG capsule Take 1 capsule (100 mg total) by mouth 3 (three) times daily as needed for cough. Patient not taking:  Reported on 08/01/2017 07/16/17   Pricilla Loveless, MD  glucose blood test strip Use as instructed 04/16/17   Ranelle Oyster, MD  Lancets [facility] ULTRASOFT) lancets Check blood sugars twice a day 04/16/17   Ranelle Oyster, MD    Family History Family History  Problem Relation Age of Onset  . Diabetes Neg Hx   . Stroke Neg Hx   . Cancer Neg Hx     Social History Social History   Tobacco Use  . Smoking status: Never Smoker  . Smokeless tobacco: Never Used  Substance Use Topics  . Alcohol use: No  . Drug use: No     Allergies   Patient has no known allergies.   Review of Systems Review of Systems  Unable to perform ROS: Mental status change     Physical Exam Updated Vital Signs BP (!) 127/94 (BP Location: Right Arm)   Pulse 70   Temp 98.2 F (36.8 C) (Axillary) Comment: refused oral temp, slapping at satff  Resp 18   SpO2 100%   Physical Exam  Constitutional: He appears well-developed and well-nourished.  HENT:  Head: Normocephalic and atraumatic.  Right Ear: External ear normal.  Left Ear: External ear normal.  Nose: Nose normal.  Eyes: Right eye exhibits no discharge. Left eye exhibits no discharge.  Neck: Neck supple.  Cardiovascular: Normal rate, regular rhythm and normal heart sounds.  Pulmonary/Chest: Effort normal and breath sounds normal.  Abdominal: Soft. There is no tenderness.  Musculoskeletal: He exhibits no edema.  Neurological: He is alert.  Awake, alert, but confused. Limited exam b/c patient does not follow commands. However he freely moves all 4 extremities. Family states when he talks that he isn't making sense  Skin: Skin is warm and dry. He is not diaphoretic.  Nursing note and vitals reviewed.    ED Treatments / Results  Labs (all labs ordered are listed, but only abnormal results are displayed) Labs Reviewed  CBC - Abnormal; Notable for the following components:      Result Value   RBC 4.12 (*)    HCT 37.1 (*)    All other  components within normal limits  DIFFERENTIAL - Abnormal; Notable for the following components:   Monocytes Absolute 1.3 (*)    All other components within normal limits  COMPREHENSIVE METABOLIC PANEL - Abnormal; Notable for the following components:   Sodium 133 (*)    Chloride 100 (*)    Glucose, Bld 114 (*)    Calcium 8.7 (*)    Total Bilirubin 1.5 (*)    All other components within normal limits  CBG MONITORING, ED - Abnormal; Notable for the following components:   Glucose-Capillary 119 (*)    All other components within normal limits  PROTIME-INR  APTT  URINALYSIS, ROUTINE W REFLEX MICROSCOPIC  SODIUM  SODIUM  SODIUM  I-STAT TROPONIN, ED    EKG  EKG Interpretation  Date/Time:  Thursday August 01 2017 13:23:53 EST Ventricular Rate:  69 PR Interval:  166 QRS Duration: 98 QT Interval:  424 QTC Calculation: 454 R Axis:   72 Text Interpretation:  Normal sinus rhythm Normal ECG no significant change compared to Jul 16 2017 Confirmed by Pricilla Loveless [phone]) on 08/01/2017 2:49:54 PM       Radiology Ct Head Wo Contrast  Addendum Date: 08/01/2017   ADDENDUM REPORT: 08/01/2017 14:44 ADDENDUM: Study discussed by telephone with Dr. Tilden Fossa on 08/01/2017 at 1435 hours. Electronically Signed   By: Odessa Fleming M.D.   On: 08/01/2017 14:44   Result Date: 08/01/2017 CLINICAL DATA:  76 year old male with confusion and abnormal speech. Right MCA M1 occlusion in July. EXAM: CT HEAD WITHOUT CONTRAST TECHNIQUE: Contiguous axial images were obtained from the base of the skull through the vertex without intravenous contrast. COMPARISON:  Brain MRIs and head CTs in July this year, 04/02/2017 and earlier. FINDINGS: Brain: Hypodense cytotoxic edema in the left MCA territory affecting the insula, the majority of the anterior and superior left temporal lobe, and tracking into the right parietal lobe. ASPECTS 6 (abnormal insula, M2, M3, m6). Intracranial mass effect including about 2 mm of  rightward midline shift. Partial effacement of the left lateral ventricle. Possible mild petechial hemorrhage. No malignant hemorrhagic transformation. Interval encephalomalacia in the right MCA white matter. Underlying chronic bilateral confluent white matter hypodensity. No ventriculomegaly. No acute intracranial hemorrhage identified. Vascular: Calcified atherosclerosis at the skull base. No suspicious intracranial vascular hyperdensity. Skull: No acute osseous abnormality identified. Sinuses/Orbits: Visualized paranasal sinuses and mastoids are stable and well pneumatized. Other: No acute orbit or scalp soft tissue findings. IMPRESSION: 1. Evolving Left MCA infarct (ASPECTS 6) likely from a Left MCA large vessel occlusion. 2. Possible petechial hemorrhage but no malignant hemorrhagic transformation. Mild intracranial mass effect including 2 mm of rightward midline shift. 3. Sequelae of right MCA white matter infarcts in July with right hemisphere encephalomalacia. Underlying advanced chronic white matter disease. Electronically Signed: By: Odessa Fleming M.D. On: 08/01/2017 14:31   Dg Chest Portable 1 View  Result Date: 08/01/2017 CLINICAL DATA:  Initial evaluation for acute cough, altered mental status. EXAM: PORTABLE CHEST 1 VIEW COMPARISON:  Prior radiograph from 07/16/2017. FINDINGS: Cardiac and mediastinal silhouettes are stable in size and contour, and remain within normal limits. Aortic atherosclerosis. Lungs are hypoinflated. Mild patchy left basilar opacity, favored to reflect atelectasis, although early/developing infiltrate could also be considered. No other focal airspace disease. No pulmonary edema or pleural effusion. No pneumothorax. No acute osseous abnormality. IMPRESSION: 1. Shallow lung inflation with associated mild patchy left basilar opacity. Atelectasis is favored, although small infiltrate could be considered in the correct clinical setting. 2. No other active cardiopulmonary disease. 3.  Aortic atherosclerosis. Electronically Signed   By: Rise Mu M.D.   On: 08/01/2017 15:21    Procedures .Critical Care Performed by: Pricilla Loveless, MD Authorized by: Pricilla Loveless, MD   Critical care provider statement:    Critical care time (minutes):  30   Critical care was necessary to treat or prevent imminent or life-threatening deterioration of the following conditions:  CNS failure or compromise   Critical care was time spent personally by me on the following activities:  Development of treatment plan with patient or surrogate, discussions with consultants, evaluation of patient's response to treatment, examination of patient, obtaining history from patient or surrogate, ordering and performing treatments and interventions, ordering and review of laboratory studies, ordering and review of radiographic  studies, pulse oximetry, re-evaluation of patient's condition and review of old charts   (including critical care time)  Medications Ordered in ED Medications  sodium chloride 0.9 % bolus 500 mL (500 mLs Intravenous New Bag/Given 08/01/17 1453)  haloperidol lactate (HALDOL) injection 1 mg (1 mg Intravenous Given 08/01/17 1534)     Initial Impression / Assessment and Plan / ED Course  I have reviewed the triage vital signs and the nursing notes.  Pertinent labs & imaging results that were available during my care of the patient were reviewed by me and considered in my medical decision making (see chart for details).  Clinical Course as of Aug 01 1699  Thu Aug 01, 2017  1505 D/w Dr. Laurence SlateAroor, supportive care now that he's 24 hours out. Advises medicine admit and workup. Given edema and mild shift, 3% NaCl at 50 mL/hour, though he is also unlikely to herniate in his opinion.  [SG]    Clinical Course User Index [SG] Pricilla LovelessGoldston, Aneita Kiger, MD    Patient is awake and alert but very confused and altered.  No focal neuro deficits otherwise.  He does have a cough but this is been  continuous for about 1 month.  Family states he is currently still altered.  Discussed CT with neuro as above.  Originally discussed 3% normal saline but then after neuro has seen the patient they would advise no hypertonic saline because he is awake and not somnolent at all.  However this may still be a possibility so the patient probably needs stepdown management.  Hospitalist to admit.  He was given a small dose of Haldol as he continues to be altered and sometimes trying to take out the IV.  Final Clinical Impressions(s) / ED Diagnoses   Final diagnoses:  Acute ischemic left MCA stroke [facility](HCC)  Cytotoxic cerebral edema [facility](HCC)    ED Discharge Orders    None       Pricilla LovelessGoldston, Rudine Rieger, MD 08/01/17 1702

## 2017-08-01 NOTE — ED Notes (Addendum)
Attempted to use interpreter on phone to do NIH scale. Interpreter stated "his speech is garbled and I don't understand what he is saying". Pt. Family came into the room and I had the translator on the phone. Family states his speech is the same from the last stroke he had in July. Pt. Is confused and doesn't understand where he is at and is unable to state his name or follow commands.

## 2017-08-01 NOTE — ED Notes (Signed)
In room A2.

## 2017-08-01 NOTE — ED Notes (Signed)
Pt does not speak english and does not like to be touched (was pulling EKG leads off and refusing to take his jacket off) but did much better when his hand was held. Family also noted that they couldn't understand him when he spoke to them.

## 2017-08-01 NOTE — ED Triage Notes (Signed)
Patient here with family who reports that patient has incomprehensible speech, altered, not eating and combative when trying to assist. Symptoms onset 1 pm yesterday

## 2017-08-01 NOTE — H&P (Signed)
Patient Demographics:    Bradley Dimmerurna Mclin, is a 76 y.o. 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 Evolving Left MCA infarct (ASPECTS 6) likely from a Left MCA large vessel occlusion. 2. Possible petechial hemorrhage but no malignant hemorrhagic transformation. Mild intracranial mass effect including 2 mm of rightward midline shift. 3. Sequelae of right MCA white matter infarcts in July with right hemisphere encephalomalacia. Underlying advanced chronic white matter disease.   Plan:-  1)Acute Lt MCA Stroke-patient appears to have mass, possible petechial hemorrhage however no malignant hemorrhagic transformation noted,-effect with rightward midline shift, neurologist Dr. Laurence SlateAroor advised against 3% normal saline at this time .  Continue to monitor neuro status closely and reevaluate from time to time recent history of right MCA stroke in July 2018.  Continue Plavix and Lipitor.  Get PT, OT and social work consult as well as speech consult.  Keep n.p.o.. Allow some permissive hypertension.  Will defer decision on further neuro imaging studies (including possible CTA/MRA/MRI) to neurology team, will get echo with bubble study.   2)DM-hold metformin and Amaryl,  Allow some permissive Hyperglycemia rather than risk life-threatening hypoglycemia in a patient with unreliable oral intake. Use Novolog/Humalog Sliding scale insulin with Accu-Cheks/Fingersticks as ordered   With History of - Reviewed by me  Past Medical History:  Diagnosis Date  . Diabetes (HCC)   . Dizziness    . Hypertension       History reviewed. No pertinent surgical history.   No chief complaint on file.     HPI:    [name]  is a 76 y.o. male with past medical history relevant for diabetes, hypertension, and prior right MCA stroke  (03/2017) presents with altered mental status.  History is taken from the daughter as the patient both does not speak this language but also is altered.  The patient apparently was normal yesterday up until about 1 PM.  At 2 PM he has been confused and been confused since.  The patient has both been speaking nonsense and words they do not understand as well as not remembering stuff he normally would.  He has also been urinating and defecating in different rooms that are not bathrooms.  He had underwear on his head and was trying to go through a wall instead of a door.  It was noted that he has a cough and was recently in the ED for the same.  The cough is improving. No fevers.  Most of the above history obtained from ED provider and ED records as family not available at time of my evaluation, there is a one-to-one sitter at bedside  Patient is confused at times  combative pulling out lines and monitor wires  Appears to be moving all extremities, globally confused and incomprehensible  Again history is limited due to confusion     Review of systems:    In addition to the HPI above,   A full 12 point Review of 10 Systems was done, except as stated above, all other Review of 10 Systems were negative.    Social History:  Reviewed by me    Social History   Tobacco Use  . Smoking status: Never Smoker  . Smokeless tobacco: Never Used  Substance Use Topics  . Alcohol use: No       Family History :  Reviewed by me    Family History  Problem Relation Age of Onset  . Diabetes Neg Hx   . Stroke Neg Hx   . Cancer Neg Hx      Home Medications:   Prior to Admission medications   Medication Sig Start Date End Date Taking? Authorizing Provider    albuterol (PROVENTIL HFA;VENTOLIN HFA) 108 (90 Base) MCG/ACT inhaler Inhale 2 puffs into the lungs every 6 (six) hours as needed for wheezing or shortness of breath.   Yes [provider]  atorvastatin (LIPITOR) 80 MG tablet Take 1 tablet (80 mg total) by mouth daily at 6 PM. 04/11/17  Yes Love, Evlyn Kanner, PA-C  citalopram (CELEXA) 10 MG tablet Take 1 tablet (10 mg total) by mouth at bedtime. 04/11/17  Yes Love, Evlyn Kanner, PA-C  clopidogrel (PLAVIX) 75 MG tablet Take 1 tablet (75 mg total) by mouth daily. 04/11/17  Yes Love, Evlyn Kanner, PA-C  glimepiride (AMARYL) 4 MG tablet Take 0.5 tablets (2 mg total) by mouth daily with breakfast. 04/11/17  Yes Love, Evlyn Kanner, PA-C  loratadine (CLARITIN) 10 MG tablet Take 10 mg by mouth daily.  12/10/11  Yes [provider]  losartan-hydrochlorothiazide (HYZAAR) 100-12.5 MG tablet Take 1 tablet by mouth daily.   Yes [provider]  meclizine (ANTIVERT) 12.5 MG tablet Take 12.5 mg by mouth 3 (three) times daily as needed for dizziness.   Yes [provider]  metFORMIN (GLUCOPHAGE) 500 MG tablet Take 500 mg by mouth 2 (two) times daily with a meal.   Yes [provider]  traZODone (DESYREL) 50 MG tablet Take 50 mg by mouth at bedtime.   Yes [provider]  amoxicillin-clavulanate (AUGMENTIN) 875-125 MG tablet Take 1 tablet by mouth 2 (two) times daily. One po bid x 7 days Patient not taking: Reported on 08/01/2017 07/16/17   Pricilla Loveless, MD  benzonatate (TESSALON) 100 MG capsule Take 1 capsule (100 mg total) by mouth 3 (three) times daily as needed for cough. Patient not taking: Reported on 08/01/2017 07/16/17   Pricilla Loveless, MD  glucose blood test strip Use as instructed 04/16/17   Ranelle Oyster, MD  Lancets [facility] ULTRASOFT) lancets Check blood sugars twice a day 04/16/17   Ranelle Oyster, MD     Allergies:    No Known Allergies   Physical Exam:   Vitals  Blood pressure (!) 127/94, pulse 70,  temperature 98.2 F (36.8 C), temperature source Axillary, resp. rate 18, SpO2 100 %.  Physical Examination: General appearance -confused and disoriented at times combative Mental status -incoherent, disoriented incomprehensible  eyes - sclera anicteric Neck - supple, no JVD elevation , Chest - clear  to auscultation bilaterally, symmetrical air movement,  Heart - S1 and S2 normal,  Abdomen - soft, nontender, nondistended, no masses or organomegaly  Neurological -neck is supple, moving all extremities well, speech problems persist, confusion disorientation with occasional episodes of agitation persist  extremities - no pedal edema noted, intact peripheral pulses  Skin - warm, dry    Data Review:    CBC Recent Labs  Lab 08/01/17 1440  WBC 9.8  HGB 13.2  HCT 37.1*  PLT 161  MCV 90.0  MCH 32.0  MCHC 35.6  RDW 13.3  LYMPHSABS 1.9  MONOABS 1.3*  EOSABS 0.1  BASOSABS 0.0   ------------------------------------------------------------------------------------------------------------------  Chemistries  Recent Labs  Lab 08/01/17 1440  NA 133*  K 3.9  CL 100*  CO2 22  GLUCOSE 114*  BUN 9  CREATININE 0.91  CALCIUM 8.7*  AST 39  ALT 26  ALKPHOS 58  BILITOT 1.5*   ------------------------------------------------------------------------------------------------------------------ CrCl cannot be calculated (Unknown ideal weight.). ------------------------------------------------------------------------------------------------------------------ No results for input(s): TSH, T4TOTAL, T3FREE, THYROIDAB in the last 72 hours.  Invalid input(s): FREET3   Coagulation profile Recent Labs  Lab 08/01/17 1440  INR 1.06   ------------------------------------------------------------------------------------------------------------------- No results for input(s): DDIMER in the last 72  hours. -------------------------------------------------------------------------------------------------------------------  Cardiac Enzymes No results for input(s): CKMB, TROPONINI, MYOGLOBIN in the last 168 hours.  Invalid input(s): CK ------------------------------------------------------------------------------------------------------------------ No results found for: BNP   ---------------------------------------------------------------------------------------------------------------  Urinalysis    Component Value Date/Time   COLORURINE YELLOW 04/02/2017 2000   APPEARANCEUR HAZY (A) 04/02/2017 2000   LABSPEC 1.015 04/02/2017 2000   PHURINE 5.0 04/02/2017 2000   GLUCOSEU NEGATIVE 04/02/2017 2000   HGBUR MODERATE (A) 04/02/2017 2000   HGBUR negative 08/28/2010 0959   BILIRUBINUR NEGATIVE 04/02/2017 2000   KETONESUR 80 (A) 04/02/2017 2000   PROTEINUR NEGATIVE 04/02/2017 2000   UROBILINOGEN 1.0 02/20/2012 1944   NITRITE NEGATIVE 04/02/2017 2000   LEUKOCYTESUR LARGE (A) 04/02/2017 2000    ----------------------------------------------------------------------------------------------------------------   Imaging Results:    Ct Head Wo Contrast  Addendum Date: 08/01/2017   ADDENDUM REPORT: 08/01/2017 14:44 ADDENDUM: Study discussed by telephone with Dr. Tilden FossaELIZABETH REES on 08/01/2017 at 1435 hours. Electronically Signed   By: Odessa FlemingH  Hall M.D.   On: 08/01/2017 14:44   Result Date: 08/01/2017 CLINICAL DATA:  76 year old male with confusion and abnormal speech. Right MCA M1 occlusion in July. EXAM: CT HEAD WITHOUT CONTRAST TECHNIQUE: Contiguous axial images were obtained from the base of the skull through the vertex without intravenous contrast. COMPARISON:  Brain MRIs and head CTs in July this year, 04/02/2017 and earlier. FINDINGS: Brain: Hypodense cytotoxic edema in the left MCA territory affecting the insula, the majority of the anterior and superior left temporal lobe, and tracking into  the right parietal lobe. ASPECTS 6 (abnormal insula, M2, M3, m6). Intracranial mass effect including about 2 mm of rightward midline shift. Partial effacement of the left lateral ventricle. Possible mild petechial hemorrhage. No malignant hemorrhagic transformation. Interval encephalomalacia in the right MCA white matter. Underlying chronic bilateral confluent white matter hypodensity. No ventriculomegaly. No acute intracranial hemorrhage identified. Vascular: Calcified atherosclerosis at the skull base. No suspicious intracranial vascular hyperdensity. Skull: No acute osseous abnormality identified. Sinuses/Orbits: Visualized paranasal sinuses and mastoids are stable and well pneumatized. Other: No acute orbit or scalp soft tissue findings. IMPRESSION: 1. Evolving Left MCA infarct (ASPECTS 6) likely from a Left MCA large vessel occlusion. 2. Possible petechial hemorrhage but no malignant hemorrhagic transformation. Mild intracranial mass effect including 2 mm of rightward midline shift. 3. Sequelae of right MCA white matter infarcts in July with right hemisphere encephalomalacia. Underlying advanced chronic white matter disease. Electronically Signed:  By: Odessa Fleming M.D. On: 08/01/2017 14:31   Dg Chest Portable 1 View  Result Date: 08/01/2017 CLINICAL DATA:  Initial evaluation for acute cough, altered mental status. EXAM: PORTABLE CHEST 1 VIEW COMPARISON:  Prior radiograph from 07/16/2017. FINDINGS: Cardiac and mediastinal silhouettes are stable in size and contour, and remain within normal limits. Aortic atherosclerosis. Lungs are hypoinflated. Mild patchy left basilar opacity, favored to reflect atelectasis, although early/developing infiltrate could also be considered. No other focal airspace disease. No pulmonary edema or pleural effusion. No pneumothorax. No acute osseous abnormality. IMPRESSION: 1. Shallow lung inflation with associated mild patchy left basilar opacity. Atelectasis is favored, although small  infiltrate could be considered in the correct clinical setting. 2. No other active cardiopulmonary disease. 3. Aortic atherosclerosis. Electronically Signed   By: Rise Mu M.D.   On: 08/01/2017 15:21    Radiological Exams on Admission: Ct Head Wo Contrast  Addendum Date: 08/01/2017   ADDENDUM REPORT: 08/01/2017 14:44 ADDENDUM: Study discussed by telephone with Dr. Tilden Fossa on 08/01/2017 at 1435 hours. Electronically Signed   By: Odessa Fleming M.D.   On: 08/01/2017 14:44   Result Date: 08/01/2017 CLINICAL DATA:  76 year old male with confusion and abnormal speech. Right MCA M1 occlusion in July. EXAM: CT HEAD WITHOUT CONTRAST TECHNIQUE: Contiguous axial images were obtained from the base of the skull through the vertex without intravenous contrast. COMPARISON:  Brain MRIs and head CTs in July this year, 04/02/2017 and earlier. FINDINGS: Brain: Hypodense cytotoxic edema in the left MCA territory affecting the insula, the majority of the anterior and superior left temporal lobe, and tracking into the right parietal lobe. ASPECTS 6 (abnormal insula, M2, M3, m6). Intracranial mass effect including about 2 mm of rightward midline shift. Partial effacement of the left lateral ventricle. Possible mild petechial hemorrhage. No malignant hemorrhagic transformation. Interval encephalomalacia in the right MCA white matter. Underlying chronic bilateral confluent white matter hypodensity. No ventriculomegaly. No acute intracranial hemorrhage identified. Vascular: Calcified atherosclerosis at the skull base. No suspicious intracranial vascular hyperdensity. Skull: No acute osseous abnormality identified. Sinuses/Orbits: Visualized paranasal sinuses and mastoids are stable and well pneumatized. Other: No acute orbit or scalp soft tissue findings. IMPRESSION: 1. Evolving Left MCA infarct (ASPECTS 6) likely from a Left MCA large vessel occlusion. 2. Possible petechial hemorrhage but no malignant hemorrhagic  transformation. Mild intracranial mass effect including 2 mm of rightward midline shift. 3. Sequelae of right MCA white matter infarcts in July with right hemisphere encephalomalacia. Underlying advanced chronic white matter disease. Electronically Signed: By: Odessa Fleming M.D. On: 08/01/2017 14:31   Dg Chest Portable 1 View  Result Date: 08/01/2017 CLINICAL DATA:  Initial evaluation for acute cough, altered mental status. EXAM: PORTABLE CHEST 1 VIEW COMPARISON:  Prior radiograph from 07/16/2017. FINDINGS: Cardiac and mediastinal silhouettes are stable in size and contour, and remain within normal limits. Aortic atherosclerosis. Lungs are hypoinflated. Mild patchy left basilar opacity, favored to reflect atelectasis, although early/developing infiltrate could also be considered. No other focal airspace disease. No pulmonary edema or pleural effusion. No pneumothorax. No acute osseous abnormality. IMPRESSION: 1. Shallow lung inflation with associated mild patchy left basilar opacity. Atelectasis is favored, although small infiltrate could be considered in the correct clinical setting. 2. No other active cardiopulmonary disease. 3. Aortic atherosclerosis. Electronically Signed   By: Rise Mu M.D.   On: 08/01/2017 15:21    DVT Prophylaxis -SCD/heparin AM Labs Ordered, also please review Full Orders  Family Communication: Admission, patients condition and plan  of care including tests being ordered have been discussed with the patient who indicate understanding and agree with the plan   Code Status - Full Code  Likely DC to  Home with [facility]  Condition   fair  Mariea Clonts, Montavius Subramaniam M.D on 08/01/2017 at 6:27 PM   Between 7am to 7pm - Pager - [phone] After 7pm go to [email or web address] - password [email or web address]  [facility] - Office  [phone]  Voice Recognition Reubin Milan dictation system was used to create this note, attempts have been made to correct errors. Please contact the author with  questions and/or clarifications.

## 2017-08-01 NOTE — ED Notes (Signed)
Pt speaks no english family in room , per family pt has been confused since last night , she caNNOT UNDERSTAND HIM STATES SPEECH IS GARBLED, pt is confused, peed in kitchen

## 2017-08-01 NOTE — Consult Note (Signed)
Requesting Physician: Dr. Criss AlvineGoldston    Chief Complaint: Confusion, aphasia   History obtained from:   Chart     HPI:                                                                                                                                       [name] is an [age] y.o. male with a past medical history diabetes, hypertension and hyperlipidemia, and right MCA  stroke on 03/27/2017 on undetermined etiology( ESUS)  presents with confusion, agitation and aphasia since around 1-2 pm yesterday. He has been urinating and defecating in different rooms and was not making sense while speaking. [facility] and CT head showed a new  acute left MCA infarction. He was not a candidate for tPA or intervention due to late presentation and well established infarct.    Date last known well: 11.7.18 Time last known well:  tPA Given: no, outside window NIHSS:  Baseline MRS 3     Past Medical History:  Diagnosis Date  . Diabetes (HCC)   . Dizziness   . Hypertension     History reviewed. No pertinent surgical history.  Family History  Problem Relation Age of Onset  . Diabetes Neg Hx   . Stroke Neg Hx   . Cancer Neg Hx    Social History:  reports that  has never smoked. he has never used smokeless tobacco. He reports that he does not drink alcohol or use drugs.  Allergies: No Known Allergies  Medications:                                                                                                                        I reviewed home medications   ROS:  Unable to obtain due to mental status   Examination:                                                                                                      General: Appears well-developed and well-nourished. Sitting up, agitated, trying to get up off bed, sitter present Psych: Affect appropriate  to situation Eyes: No scleral injection HENT: No OP obstrucion Head: Normocephalic.  Cardiovascular: Normal rate and regular rhythm.  Respiratory: Effort normal and breath sounds normal to anterior ascultation GI: Soft.  No distension. There is no tenderness.  Skin: WDI   Neurological Examination Mental Status: Awake, not following any commands. Mumbling incohorently ( language limited as patient speaks nepali) Cranial Nerves: ZO:XWRUEAI:Visual fields : blinks to threat bilaterally  III,IV, VI: ptosis not present, no forced gaze deviation  V,VII: smile symmetric, facial light touch sensation normal bilaterally VIII: hearing normal bilaterally IX,X: uvula rises symmetrically XI: bilateral shoulder shrug XII: midline tongue extension Motor: limted due to agitation Right : Upper extremity   4+/5    Left:     Upper extremity   4+/5  Lower extremity   5/5     Lower extremity   5/5 Tone and bulk:normal tone throughout; no atrophy noted Sensory: unable to assess Deep Tendon Reflexes: unable to assess as pt agitated Plantars: Right: downgoing   Left: downgoing Cerebellar: Unable to assess Gait: unable to assess  Lab Results: Basic Metabolic Panel: Recent Labs  Lab 08/01/17 1440  NA 133*  K 3.9  CL 100*  CO2 22  GLUCOSE 114*  BUN 9  CREATININE 0.91  CALCIUM 8.7*    CBC: Recent Labs  Lab 08/01/17 1440  WBC 9.8  NEUTROABS 6.4  HGB 13.2  HCT 37.1*  MCV 90.0  PLT 161    Coagulation Studies: Recent Labs    08/01/17 1440  LABPROT 13.8  INR 1.06    Imaging: Ct Head Wo Contrast  Addendum Date: 08/01/2017   ADDENDUM REPORT: 08/01/2017 14:44 ADDENDUM: Study discussed by telephone with Dr. Tilden FossaELIZABETH REES on 08/01/2017 at 1435 hours. Electronically Signed   By: Odessa FlemingH  Hall M.D.   On: 08/01/2017 14:44   Result Date: 08/01/2017 CLINICAL DATA:  76 year old male with confusion and abnormal speech. Right MCA M1 occlusion in July. EXAM: CT HEAD WITHOUT CONTRAST TECHNIQUE:  Contiguous axial images were obtained from the base of the skull through the vertex without intravenous contrast. COMPARISON:  Brain MRIs and head CTs in July this year, 04/02/2017 and earlier. FINDINGS: Brain: Hypodense cytotoxic edema in the left MCA territory affecting the insula, the majority of the anterior and superior left temporal lobe, and tracking into the right parietal lobe. ASPECTS 6 (abnormal insula, M2, M3, m6). Intracranial mass effect including about 2 mm of rightward midline shift. Partial effacement of the left lateral ventricle. Possible mild petechial hemorrhage. No malignant hemorrhagic transformation. Interval encephalomalacia in the right MCA white matter. Underlying chronic bilateral confluent white matter hypodensity. No ventriculomegaly. No acute intracranial hemorrhage identified. Vascular: Calcified atherosclerosis at the skull base. No suspicious intracranial vascular hyperdensity. Skull: No  acute osseous abnormality identified. Sinuses/Orbits: Visualized paranasal sinuses and mastoids are stable and well pneumatized. Other: No acute orbit or scalp soft tissue findings. IMPRESSION: 1. Evolving Left MCA infarct (ASPECTS 6) likely from a Left MCA large vessel occlusion. 2. Possible petechial hemorrhage but no malignant hemorrhagic transformation. Mild intracranial mass effect including 2 mm of rightward midline shift. 3. Sequelae of right MCA white matter infarcts in July with right hemisphere encephalomalacia. Underlying advanced chronic white matter disease. Electronically Signed: By: Odessa Fleming M.D. On: 08/01/2017 14:31   Dg Chest Portable 1 View  Result Date: 08/01/2017 CLINICAL DATA:  Initial evaluation for acute cough, altered mental status. EXAM: PORTABLE CHEST 1 VIEW COMPARISON:  Prior radiograph from 07/16/2017. FINDINGS: Cardiac and mediastinal silhouettes are stable in size and contour, and remain within normal limits. Aortic atherosclerosis. Lungs are hypoinflated. Mild  patchy left basilar opacity, favored to reflect atelectasis, although early/developing infiltrate could also be considered. No other focal airspace disease. No pulmonary edema or pleural effusion. No pneumothorax. No acute osseous abnormality. IMPRESSION: 1. Shallow lung inflation with associated mild patchy left basilar opacity. Atelectasis is favored, although small infiltrate could be considered in the correct clinical setting. 2. No other active cardiopulmonary disease. 3. Aortic atherosclerosis. Electronically Signed   By: Rise Mu M.D.   On: 08/01/2017 15:21     ASSESSMENT AND PLAN   76 y.o. male with a past medical history diabetes, hypertension and hyperlipidemia, and right MCA  stroke on 03/27/2017 on undetermined etiology( ESUS)  presents with confusion, agitation and aphasia since around 1-2 pm yesterday.   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  Risk factors: DM, HTN, HLD Etiology: ESUS  Recommend # MRI of the brain without contrast #MRA Head and neck  #Transthoracic Echo  # HOLD ASA due to petechial hemorrhage, if stable can restart tomorrow  # continue Atorvastatin 80 mg/other high intensity statin # BP goal: permissive HTN upto 210 systolic, PRNs above 21 # HBAIC and Lipid profile # Telemetry monitoring # Frequent neuro checks # NPO until passes stroke swallow screen   Agitation Likely from gobal aphasia  Consider Seroquel or haldol PRN   Please page stroke NP  Or  PA  Or MD from 8am -4 pm  as this patient from this time will be  followed by the stroke.   You can look them up on [email or web address]  Password [email or web address]    [name] [facility] Pager Number [phone]

## 2017-08-02 ENCOUNTER — Other Ambulatory Visit: Payer: Self-pay

## 2017-08-02 ENCOUNTER — Inpatient Hospital Stay (HOSPITAL_COMMUNITY): Payer: Medicare Other

## 2017-08-02 ENCOUNTER — Other Ambulatory Visit (HOSPITAL_COMMUNITY): Payer: Medicare Other

## 2017-08-02 DIAGNOSIS — I10 Essential (primary) hypertension: Secondary | ICD-10-CM | POA: Diagnosis not present

## 2017-08-02 DIAGNOSIS — Z8673 Personal history of transient ischemic attack (TIA), and cerebral infarction without residual deficits: Secondary | ICD-10-CM

## 2017-08-02 DIAGNOSIS — I63512 Cerebral infarction due to unspecified occlusion or stenosis of left middle cerebral artery: Secondary | ICD-10-CM | POA: Diagnosis not present

## 2017-08-02 DIAGNOSIS — I63412 Cerebral infarction due to embolism of left middle cerebral artery: Secondary | ICD-10-CM | POA: Diagnosis not present

## 2017-08-02 DIAGNOSIS — R410 Disorientation, unspecified: Secondary | ICD-10-CM | POA: Diagnosis not present

## 2017-08-02 DIAGNOSIS — I63132 Cerebral infarction due to embolism of left carotid artery: Secondary | ICD-10-CM | POA: Diagnosis not present

## 2017-08-02 DIAGNOSIS — G936 Cerebral edema: Secondary | ICD-10-CM

## 2017-08-02 DIAGNOSIS — R451 Restlessness and agitation: Secondary | ICD-10-CM

## 2017-08-02 LAB — CBC
HEMATOCRIT: 35.4 % — AB (ref 39.0–52.0)
HEMOGLOBIN: 12.3 g/dL — AB (ref 13.0–17.0)
MCH: 31.1 pg (ref 26.0–34.0)
MCHC: 34.7 g/dL (ref 30.0–36.0)
MCV: 89.4 fL (ref 78.0–100.0)
Platelets: 137 10*3/uL — ABNORMAL LOW (ref 150–400)
RBC: 3.96 MIL/uL — AB (ref 4.22–5.81)
RDW: 13.2 % (ref 11.5–15.5)
WBC: 7.5 10*3/uL (ref 4.0–10.5)

## 2017-08-02 LAB — GLUCOSE, CAPILLARY
GLUCOSE-CAPILLARY: 130 mg/dL — AB (ref 65–99)
GLUCOSE-CAPILLARY: 90 mg/dL (ref 65–99)
GLUCOSE-CAPILLARY: 96 mg/dL (ref 65–99)
Glucose-Capillary: 138 mg/dL — ABNORMAL HIGH (ref 65–99)
Glucose-Capillary: 79 mg/dL (ref 65–99)

## 2017-08-02 LAB — BASIC METABOLIC PANEL
ANION GAP: 8 (ref 5–15)
BUN: 7 mg/dL (ref 6–20)
CALCIUM: 8.4 mg/dL — AB (ref 8.9–10.3)
CO2: 22 mmol/L (ref 22–32)
Chloride: 105 mmol/L (ref 101–111)
Creatinine, Ser: 0.75 mg/dL (ref 0.61–1.24)
GFR calc non Af Amer: >60 mL/min (ref >60)
Glucose, Bld: 83 mg/dL (ref 65–99)
POTASSIUM: 3.1 mmol/L — AB (ref 3.5–5.1)
Sodium: 135 mmol/L (ref 135–145)

## 2017-08-02 MED ORDER — IOPAMIDOL (ISOVUE-370) INJECTION 76%
INTRAVENOUS | Status: AC
  Filled 2017-08-02: qty 50

## 2017-08-02 MED ORDER — LORAZEPAM 2 MG/ML IJ SOLN
1.0000 mg | Freq: Once | INTRAMUSCULAR | Status: DC | PRN
  Filled 2017-08-02: qty 1

## 2017-08-02 MED ORDER — GLUCERNA SHAKE PO LIQD
237.0000 mL | Freq: Three times a day (TID) | ORAL | Status: DC
  Administered 2017-08-02 – 2017-08-04 (×3): 237 mL via ORAL

## 2017-08-02 MED ORDER — HALOPERIDOL LACTATE 5 MG/ML IJ SOLN
5.0000 mg | Freq: Once | INTRAMUSCULAR | Status: AC
  Administered 2017-08-02: 5 mg via INTRAMUSCULAR
  Filled 2017-08-02: qty 1

## 2017-08-02 MED ORDER — RESOURCE THICKENUP CLEAR PO POWD
ORAL | Status: DC | PRN
  Filled 2017-08-02: qty 125

## 2017-08-02 MED ORDER — QUETIAPINE FUMARATE 25 MG PO TABS
25.0000 mg | ORAL_TABLET | Freq: Two times a day (BID) | ORAL | Status: DC
  Administered 2017-08-03 – 2017-08-04 (×3): 25 mg via ORAL
  Filled 2017-08-02 (×3): qty 1

## 2017-08-02 MED ORDER — POTASSIUM CHLORIDE 10 MEQ/100ML IV SOLN
10.0000 meq | INTRAVENOUS | Status: AC
  Administered 2017-08-02 (×3): 10 meq via INTRAVENOUS
  Filled 2017-08-02 (×3): qty 100

## 2017-08-02 MED ORDER — HALOPERIDOL LACTATE 5 MG/ML IJ SOLN
2.0000 mg | Freq: Four times a day (QID) | INTRAMUSCULAR | Status: DC | PRN
  Administered 2017-08-02 – 2017-08-03 (×3): 2 mg via INTRAVENOUS
  Filled 2017-08-02 (×2): qty 1

## 2017-08-02 MED ORDER — ASPIRIN EC 325 MG PO TBEC
325.0000 mg | DELAYED_RELEASE_TABLET | Freq: Every day | ORAL | Status: DC
  Administered 2017-08-03: 325 mg via ORAL
  Filled 2017-08-02 (×2): qty 1

## 2017-08-02 MED ORDER — LORAZEPAM 2 MG/ML IJ SOLN: 0.5000 mg | INTRAMUSCULAR | Status: DC | PRN

## 2017-08-02 MED ORDER — HALOPERIDOL LACTATE 5 MG/ML IJ SOLN
INTRAMUSCULAR | Status: AC
  Filled 2017-08-02: qty 1
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
## 2017-08-02 NOTE — Evaluation (Signed)
Clinical/Bedside Swallow Evaluation Patient Details  Name: [name] MRN: [id number] Date of Birth: 03/02/1941  Today's Date: 08/02/2017 Time: SLP Start Time (ACUTE ONLY): 0820 SLP Stop Time (ACUTE ONLY): 0845 SLP Time Calculation (min) (ACUTE ONLY): 25 min  Past Medical History:  Past Medical History:  Diagnosis Date  . Diabetes (HCC)   . Dizziness   . Hypertension    Past Surgical History: History reviewed. No pertinent surgical history. HPI:  [name] a76 y.o.malewithpast medicalhistory relevant fordiabetes, hypertension, and prior right MCA stroke(03/2017)presents with altered mental status. History is taken from the daughter as the patient both does not speak this language but also is altered. The patient apparently was normal yesterday up until about 1 PM. At 2 PM he has been confused and been confused since. The patient has both been speaking nonsense and words they do not understand as well as not remembering stuff he normally would. He has also been urinating and defecating in different rooms that are not bathrooms. He had underwear on his head and was trying to go through a wall instead of a door. Evolving Left MCA infarct. Possible petechial hemorrhage but no malignant hemorrhagic transformation. Mild intracranial mass effect including 2 mm of rightward midline shift.   Assessment / Plan / Recommendation Clinical Impression  Pt demosntrates no immediately observeable neumuscular deficits, but coughing following all sips of thin liquids suggests possible pharyngeal neuromuscular deficit impacting airway protection or timing of swallow. Trials of puree and nectar thick liquids also elicited coughing on initial trials following water, but suspect this was due to prior aspiration of water. Further trials of nectar nad puree at end of session were tolerated without signs of aspiration. Attempted to discussed texture change with daughter at bedside but her capability  to speak and comprehend English appears limited and son is not available. The pt is clearly aphasic and does not follow verbal commands from daughter or speak clearly making use of video or phone interpreter with patient futile. [facility], pt may consume nectar thick liquids and dys 3 solids and take medications in puree in the meantime. Hopeful that pts son can attend [facility]BS.  SLP Visit Diagnosis: Dysphagia, oropharyngeal phase (R13.12)    Aspiration Risk  Moderate aspiration risk    Diet Recommendation Dysphagia 3 (Mech soft);Nectar-thick liquid   Liquid Administration via: Cup Medication Administration: Whole meds with puree Supervision: Patient able to self feed Compensations: Slow rate;Small sips/bites Postural Changes: Seated upright at 90 degrees    Other  Recommendations Other Recommendations: Order thickener from pharmacy   Follow up Recommendations Home health SLP      Frequency and Duration            Prognosis        Swallow Study   General HPI: [name] a76 y.o.malewithpast medicalhistory relevant fordiabetes, hypertension, and prior right MCA stroke(03/2017)presents with altered mental status. History is taken from the daughter as the patient both does not speak this language but also is altered. The patient apparently was normal yesterday up until about 1 PM. At 2 PM he has been confused and been confused since. The patient has both been speaking nonsense and words they do not understand as well as not remembering stuff he normally would. He has also been urinating and defecating in different rooms that are not bathrooms. He had underwear on his head and was trying to go through a wall instead of a door. Evolving Left MCA infarct. Possible petechial hemorrhage  but no malignant hemorrhagic transformation. Mild intracranial mass effect including 2 mm of rightward midline shift. Type of Study: Bedside Swallow  Evaluation Previous Swallow Assessment: none Diet Prior to this Study: NPO Temperature Spikes Noted: No Respiratory Status: Room air History of Recent Intubation: No Behavior/Cognition: Alert;Doesn't follow directions Oral Cavity Assessment: Within Functional Limits Oral Care Completed by SLP: No Oral Cavity - Dentition: Other (Comment)(will not allow oral exam) Vision: Functional for self-feeding Self-Feeding Abilities: Able to feed self Patient Positioning: Other (comment)(sitting edge of bed) Baseline Vocal Quality: Normal Volitional Cough: Cognitively unable to elicit Volitional Swallow: Unable to elicit    Oral/Motor/Sensory Function Overall Oral Motor/Sensory Function: Other (comment)(no focal weakness noted, will not follow oral motor commands)   Ice Chips Ice chips: Not tested   Thin Liquid Thin Liquid: Impaired Presentation: Cup;Self Fed Pharyngeal  Phase Impairments: Cough - Immediate    Nectar Thick Nectar Thick Liquid: Impaired Presentation: Cup Pharyngeal Phase Impairments: Cough - Delayed   Honey Thick Honey Thick Liquid: Not tested   Puree Puree: Impaired Presentation: Spoon Pharyngeal Phase Impairments: Cough - Immediate   Solid   GO   Solid: Within functional limits Presentation: Self Fed        Favor Simon, UnitedHealthBonnie Simon 08/02/2017,8:51 AM
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
## 2017-08-02 NOTE — Progress Notes (Signed)
Modified Barium Swallow Progress Note  Patient Details  Name: [name] MRN: [id number] Date of Birth: 07/13/1941  Today's Date: 08/02/2017  Modified Barium Swallow completed.  Full report located under Chart Review in the Imaging Section.  Brief recommendations include the following:  Clinical Impression  Objective assessment revealed mild pharyngeal dysphagia with primary problem being mild base of tongue and pharyngeal weakness resulting in mild to moderate vallecuar residuals. THis residue mixed with secretions causing trace coating on vocal folds and anterior trachea post swallow without sensation.  Despite consistent coughing with thin liquids at bedside pt did not aspirate thin liquids during the MBS. There is slightly delayed airway protection with swallow and suspect pt is likely to have some sensed aspiraiton events of thin liquids with daily intake. However, residuals with nectar thick liquids are also expected to cause trace penetration/aspiration events after the swallow. Given this finding, recommend initaiting a regular diet and thin liquids, allowing pt foods and liquids of choice without restriction with minimal risk. If pt can be trained to do a chin tuck against resistance or Shaker this could help improve swallow function. Will attempt therapy acutely, recommend home health at d/c.    Swallow Evaluation Recommendations       SLP Diet Recommendations: Regular solids;Thin liquid   Liquid Administration via: Cup   Medication Administration: Whole meds with puree   Supervision: Patient able to self feed;Intermittent supervision to cue for compensatory strategies   Compensations: Slow rate;Small sips/bites   Postural Changes: Remain semi-upright after after feeds/meals (Comment);Seated upright at 90 degrees   Oral Care Recommendations: Oral care BID   Other Recommendations: Order thickener from pharmacy   [facility] Long Island HospitalBonnie Jamey Harman, MA CCC-SLP [phone]  [name], [name]  [name] 08/02/2017,10:24 AM

## 2017-08-02 NOTE — Progress Notes (Signed)
STROKE TEAM PROGRESS NOTE   SUBJECTIVE (INTERVAL HISTORY) His two grandsons are at the bedside. Pt was confused, agitated, trying to get out of bed.  Received Haldol 2 mg IV and Ativan 1 mg IV, not effective.  Currently patient sitting on bed, global aphasia, however, no weakness observed.   OBJECTIVE Temp:  [98.5 F (36.9 C)-98.9 F (37.2 C)] 98.9 F (37.2 C) (11/09 0431) Pulse Rate:  [62-76] 74 (11/09 1036) Cardiac Rhythm: Normal sinus rhythm (11/09 0700) Resp:  [15-16] 16 (11/09 0431) BP: (139-170)/(79-125) 154/84 (11/09 1036) SpO2:  [97 %-100 %] 98 % (11/09 1036) Weight:  [144 lb 3.2 oz (65.4 kg)] 144 lb 3.2 oz (65.4 kg) (11/08 2046)  Recent Labs  Lab 08/01/17 1444 08/01/17 2225 08/02/17 0035 08/02/17 0429 08/02/17 1156  GLUCAP 119* 95 90 96 130*   Recent Labs  Lab 08/01/17 1440 08/02/17 0342  NA 133* 135  K 3.9 3.1*  CL 100* 105  CO2 22 22  GLUCOSE 114* 83  BUN 9 7  CREATININE 0.91 0.75  CALCIUM 8.7* 8.4*   Recent Labs  Lab 08/01/17 1440  AST 39  ALT 26  ALKPHOS 58  BILITOT 1.5*  PROT 7.4  ALBUMIN 4.0   Recent Labs  Lab 08/01/17 1440 08/02/17 0342  WBC 9.8 7.5  NEUTROABS 6.4  --   HGB 13.2 12.3*  HCT 37.1* 35.4*  MCV 90.0 89.4  PLT 161 137*   No results for input(s): CKTOTAL, CKMB, CKMBINDEX, TROPONINI in the last 168 hours. Recent Labs    08/01/17 1440  LABPROT 13.8  INR 1.06   No results for input(s): COLORURINE, LABSPEC, PHURINE, GLUCOSEU, HGBUR, BILIRUBINUR, KETONESUR, PROTEINUR, UROBILINOGEN, NITRITE, LEUKOCYTESUR in the last 72 hours.  Invalid input(s): APPERANCEUR     Component Value Date/Time   CHOL 187 03/27/2017 0355   TRIG 97 03/27/2017 0355   HDL 42 03/27/2017 0355   CHOLHDL 4.5 03/27/2017 0355   VLDL 19 03/27/2017 0355   LDLCALC 126 (H) 03/27/2017 0355   Lab Results  Component Value Date   HGBA1C 5.6 03/27/2017   No results found for: LABOPIA, COCAINSCRNUR, LABBENZ, AMPHETMU, THCU, LABBARB  No results for input(s):  ETH in the last 168 hours.  I have personally reviewed the radiological images below and agree with the radiology interpretations.  Ct Head Wo Contrast 08/01/2017 IMPRESSION: 1. Evolving Left MCA infarct (ASPECTS 6) likely from a Left MCA large vessel occlusion. 2. Possible petechial hemorrhage but no malignant hemorrhagic transformation. Mild intracranial mass effect including 2 mm of rightward midline shift. 3. Sequelae of right MCA white matter infarcts in July with right hemisphere encephalomalacia. Underlying advanced chronic white matter disease.  CTA head and neck pending  2D Echocardiogram  pending   PHYSICAL EXAM  Temp:  [98.5 F (36.9 C)-98.9 F (37.2 C)] 98.9 F (37.2 C) (11/09 0431) Pulse Rate:  [62-76] 74 (11/09 1036) Resp:  [15-16] 16 (11/09 0431) BP: (139-170)/(79-125) 154/84 (11/09 1036) SpO2:  [97 %-100 %] 98 % (11/09 1036) Weight:  [144 lb 3.2 oz (65.4 kg)] 144 lb 3.2 oz (65.4 kg) (11/08 2046)  General - Well nourished, well developed, agitated, confusion, global aphasia.  Ophthalmologic - fundi not visualized due to noncooperation.  Cardiovascular - Regular rate and rhythm with no murmur.  Neuro - awake, alert, non-English speaker. Noncooperation with exam. Grandson as interpreter.  Patient has language L2, however did not make sense as per grandson.  Not able to follow simple commands.  Not tracking objects, no blinking  to visual threat bilaterally. ? Blindness.  PERRL, EOMI to both directions, facial symmetrical, tongue midline.  Moving all extremities symmetrically. Sensation, coordination and gait not tested.   ASSESSMENT/PLAN [name] is a 76 y.o. male with history of diabetes, hypertension, history of stroke admitted for confusion, agitation and aphasia. No tPA given due to out of window.    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 cardioembolic source  Resultant confusion, agitation, global  aphasia  CT head left MCA territory moderate to large acute infarct.  Chronic right MCA patchy infarcts.  MRI not able to perform due to agitation  CTA head and neck pending  2D Echo  pending  EEG pending  LDL pending  HgbA1c penidng  TSH/B12 pending  SCDs for VTE prophylaxis  Diet heart healthy/carb modified Room service appropriate? Yes; Fluid consistency: Thin   clopidogrel 75 mg daily prior to admission, now on clopidogrel 75 mg daily. Recommend resume DAPT with ASA 325mg  and plavix 75mg  for stroke prevention  Ongoing aggressive stroke risk factor management  Therapy recommendations: Pending  Disposition: Pending  Agitation/confusion  Multifactorial including new left MCA stroke, history of stroke, baseline cognitive impairment, baseline poor vision  EEG pending to rule out seizure  Received Haldol 2 mg IV and Ativan 1 mg IV, not effective  Recommend further sedation with Haldol 5 mg IM and Ativan 2 m IV  Agree with Seroquel 25 twice daily  History of stroke  03/26/17 -MRI showed right MCA scattered infarcts, white matter infarcts and deep watershed pattern -MRI showed right M1 occlusion, 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  High-grade left MCA bifurcation and bilateral P2 segment stenosis.  Carotid Doppler unremarkable.  EF 55-60%.  LDL 126 and A1c 5.6.  He is stroke likely due to large vessel atherosclerosis, put on dual antiplatelet for 3 months along with Lipitor 80.  BP goal 130-150  Readmitted 04/02/17 - MRI showed further progression of acute right MCA territory infarct -also had UTI with encephalopathy -continued on dual antiplatelet as well as Lipitor -EEG showed right temporal slowing with diffuse slowing background.  Follow-up with Dr. Marjory LiesPenumalli on 05/29/17 - patient doing well, poor vision- continued above treatment plan   Diabetes  HgbA1c penidng goal < 7.0  Controlled  Home meds  including metformin and glipizide  CBG monitoring  SSI  Hypertension Stable Permissive hypertension (OK if <220/120) for 24-48 hours post stroke and then gradually normalized within 5-7 days.  Long term BP goal 130-150 due to severe intracranial occlusion/stenosis  Hyperlipidemia  Home meds: Lipitor 80  LDL pending, goal < 70  Lipitor 80 resumed  Continue statin at discharge  Other Stroke Risk Factors  Advanced age  Hx stroke/TIA  Other Active Problems  Hypokalemia  Hyponatremia  Language barrier  Poor vision  Hospital day # 1  I spent  35 minutes in total face-to-face time with the patient, more than 50% of which was spent in counseling and coordination of care, reviewing test results, images and medication, and discussing the diagnosis of new left hemispheric stroke, treatment plan and potential prognosis. This patient's care requiresreview of multiple databases, neurological assessment, discussion with grandsons and Dr. Allena KatzPatel, other specialists and medical decision making of high complexity.   Marvel PlanJindong Tandy Grawe, MD PhD Stroke Neurology 08/02/2017 4:50 PM    To contact Stroke Continuity provider, please refer to [email or web address]. After hours, contact General Neurology

## 2017-08-02 NOTE — Progress Notes (Signed)
Initial Nutrition Assessment  DOCUMENTATION CODES:   Not applicable  INTERVENTION:    Glucerna Shake po TID, each supplement provides 220 kcal and 10 grams of protein  NUTRITION DIAGNOSIS:   Inadequate oral intake related to lethargy/confusion as evidenced by meal completion < 25%.  GOAL:   Patient will meet greater than or equal to 90% of their needs  MONITOR:   PO intake, Supplement acceptance  REASON FOR ASSESSMENT:   Malnutrition Screening Tool    ASSESSMENT:   76 yo male with PMH of DM, HTN, HLD, R MCA stroke who was admitted on 11/8 with new acute L MCA infarction. Patient is from [place] and speaks [place].   Patient combative earlier in the day. Family had just left room and patient had calmed down when RD entered room. Sitter and OT also in room. Attempted communication via video interpreter on tablet, but patient did not respond to the interpreter. Unable to obtain any nutrition from patient.    Per chart review and MST score, patient was eating poorly PTA and had lost weight. Per review of weights, weight has been stable for the past four months.  S/P SLP evaluation this morning, SLP recommended regular solids with thin liquids s/p MBS. Offered patient a Glucerna Shake to drink, but he just waved his hands and shook his head no.  Labs reviewed. Potassium 3.1 (L) CBG's: 90-96-130 Medications reviewed.  Unable to complete full nutrition focused physical exam.   NUTRITION - FOCUSED PHYSICAL EXAM:    Most Recent Value  Orbital Region  Unable to assess  Upper Arm Region  Unable to assess  Thoracic and Lumbar Region  Unable to assess  Buccal Region  No depletion  Temple Region  Mild depletion  Clavicle Bone Region  No depletion  Clavicle and Acromion Bone Region  No depletion  Scapular Bone Region  Unable to assess  Dorsal Hand  Unable to assess  Patellar Region  Unable to assess  Anterior Thigh Region  Unable to assess  Posterior Calf Region  Unable to  assess  Edema (RD Assessment)  Unable to assess  Hair  Reviewed  Eyes  Unable to assess  Mouth  Unable to assess  Skin  Reviewed  Nails  Unable to assess       Diet Order:  Diet heart healthy/carb modified Room service appropriate? Yes; Fluid consistency: Thin  EDUCATION NEEDS:   No education needs have been identified at this time  Skin:  Skin Assessment: Reviewed RN Assessment  Last BM:  unknown  Height:   Ht Readings from Last 5 Encounters:  08/01/17 5\' 3"  (1.6 m)  05/29/17 5\' 7"  (1.702 m)  04/05/17 5\' 7"  (1.702 m)  04/02/17 5\' 7"  (1.702 m)  03/26/17 5\' 7"  (1.702 m)    Weight:   Wt Readings from Last 1 Encounters:  08/01/17 144 lb 3.2 oz (65.4 kg)    Ideal Body Weight:  67.3 kg (using previous ht of 5'7")  BMI:  Body mass index is 25.54 kg/m.  Estimated Nutritional Needs:   Kcal:  1650-1850  Protein:  75-85 gm  Fluid:  1.8 L   Joaquin CourtsKimberly Harris, RD, LDN, CNSC Pager [phone] After Hours Pager [phone]

## 2017-08-03 ENCOUNTER — Inpatient Hospital Stay (HOSPITAL_COMMUNITY): Payer: Medicare Other

## 2017-08-03 ENCOUNTER — Encounter (HOSPITAL_COMMUNITY): Payer: Self-pay | Admitting: Radiology

## 2017-08-03 DIAGNOSIS — I63412 Cerebral infarction due to embolism of left middle cerebral artery: Secondary | ICD-10-CM | POA: Diagnosis not present

## 2017-08-03 DIAGNOSIS — R4182 Altered mental status, unspecified: Secondary | ICD-10-CM | POA: Diagnosis not present

## 2017-08-03 DIAGNOSIS — I63512 Cerebral infarction due to unspecified occlusion or stenosis of left middle cerebral artery: Secondary | ICD-10-CM | POA: Diagnosis not present

## 2017-08-03 LAB — TSH: TSH: 6.379 u[IU]/mL — ABNORMAL HIGH (ref 0.350–4.500)

## 2017-08-03 LAB — BASIC METABOLIC PANEL
ANION GAP: 7 (ref 5–15)
BUN: 5 mg/dL — ABNORMAL LOW (ref 6–20)
CALCIUM: 8.9 mg/dL (ref 8.9–10.3)
CO2: 25 mmol/L (ref 22–32)
Chloride: 105 mmol/L (ref 101–111)
Creatinine, Ser: 0.81 mg/dL (ref 0.61–1.24)
GFR calc non Af Amer: >60 mL/min (ref >60)
Glucose, Bld: 98 mg/dL (ref 65–99)
Potassium: 3.7 mmol/L (ref 3.5–5.1)
Sodium: 137 mmol/L (ref 135–145)

## 2017-08-03 LAB — GLUCOSE, CAPILLARY
GLUCOSE-CAPILLARY: 107 mg/dL — AB (ref 65–99)
Glucose-Capillary: 108 mg/dL — ABNORMAL HIGH (ref 65–99)
Glucose-Capillary: 127 mg/dL — ABNORMAL HIGH (ref 65–99)

## 2017-08-03 LAB — CBC
HEMATOCRIT: 36.7 % — AB (ref 39.0–52.0)
HEMOGLOBIN: 12.8 g/dL — AB (ref 13.0–17.0)
MCH: 31.4 pg (ref 26.0–34.0)
MCHC: 34.9 g/dL (ref 30.0–36.0)
MCV: 90 fL (ref 78.0–100.0)
Platelets: 155 10*3/uL (ref 150–400)
RBC: 4.08 MIL/uL — ABNORMAL LOW (ref 4.22–5.81)
RDW: 13.6 % (ref 11.5–15.5)
WBC: 6.7 10*3/uL (ref 4.0–10.5)

## 2017-08-03 LAB — VITAMIN B12: VITAMIN B 12: 404 pg/mL (ref 180–914)

## 2017-08-03 LAB — LIPID PANEL
CHOL/HDL RATIO: 3.7 ratio
Cholesterol: 118 mg/dL (ref 0–200)
HDL: 32 mg/dL — AB (ref >40)
LDL CALC: 72 mg/dL (ref 0–99)
TRIGLYCERIDES: 70 mg/dL (ref <150)
VLDL: 14 mg/dL (ref 0–40)

## 2017-08-03 LAB — HEMOGLOBIN A1C
HEMOGLOBIN A1C: 5.6 % (ref 4.8–5.6)
Mean Plasma Glucose: 114.02 mg/dL

## 2017-08-03 MED ORDER — IOPAMIDOL (ISOVUE-370) INJECTION 76%
50.0000 mL | Freq: Once | INTRAVENOUS | Status: AC | PRN
  Administered 2017-08-03: 50 mL via INTRAVENOUS

## 2017-08-03 NOTE — Progress Notes (Signed)
Discussed with pts daughter to please inform nursing staff if/when the family must leave the room so we can provide extra safety staff for patient. Daughter agreed.

## 2017-08-03 NOTE — Progress Notes (Signed)
STROKE TEAM PROGRESS NOTE   SUBJECTIVE (INTERVAL HISTORY) His daughter is at the bedside. Pt was confused, agitated, trying to get out of bed.and has received sedation  Currently patient is sleeping and can barely be aroused  OBJECTIVE Temp:  [97.7 F (36.5 C)-99 F (37.2 C)] 99 F (37.2 C) (11/10 1330) Pulse Rate:  [70-85] 78 (11/10 1330) Cardiac Rhythm: Normal sinus rhythm (11/10 0700) Resp:  [16-20] 20 (11/10 1330) BP: (123-145)/(69-90) 140/90 (11/10 1330) SpO2:  [99 %-100 %] 100 % (11/10 1330) Weight:  [138 lb 1.6 oz (62.6 kg)] 138 lb 1.6 oz (62.6 kg) (11/10 0514)  Recent Labs  Lab 08/02/17 1156 08/02/17 1714 08/02/17 2118 08/03/17 0828 08/03/17 1142  GLUCAP 130* 138* 79 108* 127*   Recent Labs  Lab 08/01/17 1440 08/02/17 0342 08/03/17 0457  NA 133* 135 137  K 3.9 3.1* 3.7  CL 100* 105 105  CO2 22 22 25   GLUCOSE 114* 83 98  BUN 9 7 5*  CREATININE 0.91 0.75 0.81  CALCIUM 8.7* 8.4* 8.9   Recent Labs  Lab 08/01/17 1440  AST 39  ALT 26  ALKPHOS 58  BILITOT 1.5*  PROT 7.4  ALBUMIN 4.0   Recent Labs  Lab 08/01/17 1440 08/02/17 0342 08/03/17 0457  WBC 9.8 7.5 6.7  NEUTROABS 6.4  --   --   HGB 13.2 12.3* 12.8*  HCT 37.1* 35.4* 36.7*  MCV 90.0 89.4 90.0  PLT 161 137* 155   No results for input(s): CKTOTAL, CKMB, CKMBINDEX, TROPONINI in the last 168 hours. Recent Labs    08/01/17 1440  LABPROT 13.8  INR 1.06   No results for input(s): COLORURINE, LABSPEC, PHURINE, GLUCOSEU, HGBUR, BILIRUBINUR, KETONESUR, PROTEINUR, UROBILINOGEN, NITRITE, LEUKOCYTESUR in the last 72 hours.  Invalid input(s): APPERANCEUR     Component Value Date/Time   CHOL 118 08/03/2017 0457   TRIG 70 08/03/2017 0457   HDL 32 (L) 08/03/2017 0457   CHOLHDL 3.7 08/03/2017 0457   VLDL 14 08/03/2017 0457   LDLCALC 72 08/03/2017 0457   Lab Results  Component Value Date   HGBA1C 5.6 08/03/2017   No results found for: LABOPIA, COCAINSCRNUR, LABBENZ, AMPHETMU, THCU, LABBARB  No  results for input(s): ETH in the last 168 hours.  I have personally reviewed the radiological images below and agree with the radiology interpretations.  Ct Head Wo Contrast 08/01/2017 IMPRESSION: 1. Evolving Left MCA infarct (ASPECTS 6) likely from a Left MCA large vessel occlusion. 2. Possible petechial hemorrhage but no malignant hemorrhagic transformation. Mild intracranial mass effect including 2 mm of rightward midline shift. 3. Sequelae of right MCA white matter infarcts in July with right hemisphere encephalomalacia. Underlying advanced chronic white matter disease.  CTA head and neck No significant carotid or vertebral artery stenosis in the neck.Extensive intracranial atherosclerotic disease.Moderate stenosis right PCA and mild stenosis left PCA.Right M1 segment occluded.  Moderate stenosis right A1 segment.Moderate stenosis left supraclinoid internal carotid artery.Diffusely diseased left M1 segment with decreased flow in the inferior division left MCA due to acute infarct  2D Echocardiogram  pending   PHYSICAL EXAM  Temp:  [97.7 F (36.5 C)-99 F (37.2 C)] 99 F (37.2 C) (11/10 1330) Pulse Rate:  [70-85] 78 (11/10 1330) Resp:  [16-20] 20 (11/10 1330) BP: (123-145)/(69-90) 140/90 (11/10 1330) SpO2:  [99 %-100 %] 100 % (11/10 1330) Weight:  [138 lb 1.6 oz (62.6 kg)] 138 lb 1.6 oz (62.6 kg) (11/10 0514)  General - Well nourished, well developed, agitated, confusion, global aphasia.  Ophthalmologic - fundi not visualized due to noncooperation.  Cardiovascular - Regular rate and rhythm with no murmur.  Neuro - awake, alert, non-English speaker. Noncooperation with exam. Daughter acts as interpreter.   .  Not able to follow simple commands.  Not tracking objects, no blinking to visual threat bilaterally. ? Blindness.  PERRL, EOMI to both directions, facial symmetrical, tongue midline.  Moving all extremities symmetrically. Sensation, coordination and gait not  tested.   ASSESSMENT/PLAN [name] is a 76 y.o. male with history of diabetes, hypertension, history of stroke admitted for confusion, agitation and aphasia. No tPA given due to out of window.    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 cardioembolic source  Resultant confusion, agitation, global aphasia  CT head left MCA territory moderate to large acute infarct.  Chronic right MCA patchy infarcts.  MRI not able to perform due to agitation  CTA head and neck  As above  2D Echo  pending  EEG focal slowing on left but no seizures  LDL 72  HgbA1c 5.6  TSH 6.37  /B12 404  SCDs for VTE prophylaxis  Diet heart healthy/carb modified Room service appropriate? Yes; Fluid consistency: Thin   clopidogrel 75 mg daily prior to admission, now on clopidogrel 75 mg daily. Recommend resume DAPT with ASA 325mg  and plavix 75mg  for stroke prevention  Ongoing aggressive stroke risk factor management  Therapy recommendations: Pending  Disposition: Pending  Agitation/confusion  Multifactorial including new left MCA stroke, history of stroke, baseline cognitive impairment, baseline poor vision  EEG negative for seizures  Received Haldol 2 mg IV and Ativan 1 mg IV, not effective  Recommend further sedation with Haldol 5 mg IM and Ativan 2 m IV  Agree with Seroquel 25 twice daily  History of stroke  03/26/17 -MRI showed right MCA scattered infarcts, white matter infarcts and deep watershed pattern -MRI showed right M1 occlusion, 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  High-grade left MCA bifurcation and bilateral P2 segment stenosis.  Carotid Doppler unremarkable.  EF 55-60%.  LDL 126 and A1c 5.6.  He is stroke likely due to large vessel atherosclerosis, put on dual antiplatelet for 3 months along with Lipitor 80.  BP goal 130-150  Readmitted 04/02/17 - MRI showed  further progression of acute right MCA territory infarct -also had UTI with encephalopathy -continued on dual antiplatelet as well as Lipitor -EEG showed right temporal slowing with diffuse slowing background.  Follow-up with [name] on 05/29/17 - patient doing well, poor vision- continued above treatment plan   Diabetes  HgbA1c 5.6  goal < 7.0  Controlled  Home meds including metformin and glipizide  CBG monitoring  SSI  Hypertension Stable Permissive hypertension (OK if <220/120) for 24-48 hours post stroke and then gradually normalized within 5-7 days.  Long term BP goal 130-150 due to severe intracranial occlusion/stenosis  Hyperlipidemia  Home meds: Lipitor 80  LDL 72, goal < 70  Lipitor 80 resumed  Continue statin at discharge  Other Stroke Risk Factors  Advanced age  Hx stroke/TIA  Other Active Problems  Hypokalemia  Hyponatremia  Language barrier  Poor vision  Hospital day # 2  I spent  25 minutes in total face-to-face time with the patient, more than 50% of which was spent in counseling and coordination of care, reviewing test results, images and medication, and discussing the diagnosis of new left hemispheric stroke, treatment plan and  potential prognosis. This patient's care requiresreview of multiple databases, neurological assessment, discussion with his daughter and Dr. Allena KatzPatel, other specialists and medical decision making of high complexity.   Bradley HeadyPramod Sethi, MD Stroke Neurology 08/03/2017 2:00 PM    To contact Stroke Continuity provider, please refer to [email or web address]. After hours, contact General Neurology

## 2017-08-03 NOTE — Progress Notes (Signed)
Pt ambulating in room with family support. Attempted to communicate thru video assisted interpreter but pt remains confused and is unable to communicate clearly. Family assisting patient with safety. Pt has very poor po intake. Refusing most offers for food/drink. Will cont to monitor.

## 2017-08-03 NOTE — Progress Notes (Signed)
[facility] Progress Note  Patient: [name] ZOX:[id number]   PCP: Fleet ContrasAvbuere, [name], MD DOB: 05/30/1941   DOA: 08/01/2017   DOS: 08/03/2017   Date of Service: the patient was seen and examined on 08/03/2017  Subjective: Remains confused but less agitated.  Also has muffled speech, daughter at bedside who is helping interpret with the patient and sometimes she is unable to understand what the patient is seen as well.  Brief hospital course: Pt. with PMH of diabetes, HTN; admitted on 08/01/2017, presented with complaint of confusion and aphasia, was found to have left MCA infarct with mass-effect. Currently further plan is get CTA and further workup.  Assessment and Plan: 1.  Left acute MCA infarct. Petechial hemorrhage. Vasogenic edema with mass-effect with 2 mm midline shift. Neurology was consulted. Patient has prior history of right MCA infarct with encephalomalacia developing there. Last admission patient was on aspirin and Plavix. Patient was started on Plavix this morning per recommendation from neurology. We will get CTA head and neck to rule out any acute abnormality with mass-effect as well as hemorrhage. Hold off on heparin for DVT prophylaxis right now.  2.  Acute encephalopathy, likely multifactorial. Delirium and metabolic part cannot be ruled out. Global CNS depression can also be playing a part due to large CVA. As needed Ativan, as needed Haldol.  Scheduled Seroquel monitor. Patient unable to follow commands for examination. Will use Ativan for sedation for CT scan. Check EEG to rule out any seizure as well. Patient will continue to refuse to talk with interpreter on the phone, and on my exam on 08/02/2017 patient's speech was not clear enough for the interpreter to understand on the phone as well.  3.  Type 2 diabetes mellitus. On insulin sliding scale. On oral hypoglycemic agents. Patient has passed his swallow evaluation and is on carb modified diet.  4.   Hypokalemia. Replaced. Monitor.  Diet: Carb modified diet DVT Prophylaxis: mechanical compression device  Advance goals of care discussion: full code  Family Communication: family was present at bedside, at the time of interview. The pt provided permission to discuss medical plan with the family. Opportunity was given to ask question and all questions were answered satisfactorily.   Disposition:  Discharge to be determined.   Consultants: neurology Procedures: Echocardiogram   Antibiotics: Anti-infectives (From admission, onward)   None       Objective: Physical Exam: Vitals:   08/02/17 1036 08/02/17 2120 08/03/17 0514 08/03/17 0917  BP: (!) 154/84 (!) 145/69 (!) 144/79 123/86  Pulse: 74 70 77 85  Resp:  16 16 20   Temp:  97.7 F (36.5 C) 98.1 F (36.7 C) 98.6 F (37 C)  TempSrc:  Axillary Oral Axillary  SpO2: 98% 100% 99% 100%  Weight:   62.6 kg (138 lb 1.6 oz)   Height:        Intake/Output Summary (Last 24 hours) at 08/03/2017 1129 Last data filed at 08/03/2017 0703 Gross per 24 hour  Intake 60 ml  Output 400 ml  Net -340 ml   Filed Weights   08/01/17 2046 08/03/17 0514  Weight: 65.4 kg (144 lb 3.2 oz) 62.6 kg (138 lb 1.6 oz)   General: Alert, Awake and appears confused.  Not communicating with interpreter. Appear in marked distress, affect irritable Eyes: PERRL, Conjunctiva normal ENT: Oral Mucosa clear moist. Neck: difficult to assess JVD, no Abnormal Mass Or lumps Cardiovascular: S1 and S2 Present, no Murmur, Peripheral Pulses Present Respiratory: normal respiratory effort, Bilateral Air entry  equal and Decreased, no use of accessory muscle, Clear to Auscultation, no Crackles, no wheezes Abdomen: Bowel Sound present, Soft and no tenderness, no hernia Skin: no redness, no Rash, no induration Extremities: no Pedal edema, no calf tenderness Neurologic: Grossly no focal neuro deficit. Bilaterally Equal motor strength  Data Reviewed: CBC: Recent Labs    Lab 08/01/17 1440 08/02/17 0342 08/03/17 0457  WBC 9.8 7.5 6.7  NEUTROABS 6.4  --   --   HGB 13.2 12.3* 12.8*  HCT 37.1* 35.4* 36.7*  MCV 90.0 89.4 90.0  PLT 161 137* 155   Basic Metabolic Panel: Recent Labs  Lab 08/01/17 1440 08/02/17 0342 08/03/17 0457  NA 133* 135 137  K 3.9 3.1* 3.7  CL 100* 105 105  CO2 22 22 25   GLUCOSE 114* 83 98  BUN 9 7 5*  CREATININE 0.91 0.75 0.81  CALCIUM 8.7* 8.4* 8.9    Liver Function Tests: Recent Labs  Lab 08/01/17 1440  AST 39  ALT 26  ALKPHOS 58  BILITOT 1.5*  PROT 7.4  ALBUMIN 4.0   No results for input(s): LIPASE, AMYLASE in the last 168 hours. No results for input(s): AMMONIA in the last 168 hours. Coagulation Profile: Recent Labs  Lab 08/01/17 1440  INR 1.06   Cardiac Enzymes: No results for input(s): CKTOTAL, CKMB, CKMBINDEX, TROPONINI in the last 168 hours. BNP (last 3 results) No results for input(s): PROBNP in the last 8760 hours. CBG: Recent Labs  Lab 08/02/17 0429 08/02/17 1156 08/02/17 1714 08/02/17 2118 08/03/17 0828  GLUCAP 96 130* 138* 79 108*   Studies: No results found.  Scheduled Meds: . aspirin EC  325 mg Oral Daily  . atorvastatin  80 mg Oral q1800  . citalopram  10 mg Oral QHS  . clopidogrel  75 mg Oral Daily  . feeding supplement (GLUCERNA SHAKE)  237 mL Oral TID BM  . insulin aspart  0-9 Units Subcutaneous TID WC  . QUEtiapine  25 mg Oral BID  . senna  1 tablet Oral BID  . sodium chloride flush  3 mL Intravenous Q12H  . traZODone  50 mg Oral QHS   Continuous Infusions: . sodium chloride     PRN Meds: sodium chloride, acetaminophen **OR** acetaminophen, albuterol, haloperidol lactate, LORazepam, LORazepam, LORazepam, meclizine, ondansetron **OR** ondansetron (ZOFRAN) IV, polyethylene glycol, RESOURCE THICKENUP CLEAR, sodium chloride flush  Time spent: 35 minutes  Author: Lynden OxfordPranav Luisa Louk, MD [facility] Pager: [phone] 08/03/2017 11:29 AM  If 7PM-7AM, please contact  night-coverage at [email or web address], password [email or web address]

## 2017-08-03 NOTE — Progress Notes (Signed)
PT Cancellation Note  Patient Details Name: [name] Zacharia MRN: [id number] DOB: 11/29/1940   Cancelled Treatment:    Reason Eval/Treat Not Completed: Fatigue/lethargy limiting ability to participate.  Will try later as time and pt allow.   Ivar DrapeRuth E Berton Butrick 08/03/2017, 2:26 PM   Samul Dadauth Karsin Pesta, PT MS Acute Rehab Dept. Number: [facility] [id number] and [facility] [phone]

## 2017-08-03 NOTE — Progress Notes (Signed)
EEG Completed; Results Pending  

## 2017-08-03 NOTE — Procedures (Signed)
EEG Report  Clinical History:  Confusion, aphasia, recent Left MCA infarct with shift, older right MCA infarct.  Technical Summary:  A 19 channel digital EEG recording was performed using the 10-20 international system of electrode placement.  Bipolar and Referential montages were used.  The total recording time was approx 20 minutes.  Findings:  There is a posterior background frequency of 6-7 Hz.  Voltages are generally suppressed, with more prominent voltage suppression in the left temporal area.  No epileptiform discharges or electrographic seizures are present.    Impression:  This is an abnormal EEG.  There is evidence of mild to moderate generalized slowing of brain activity, more prominent in the left temporal lobe.  No evidence of seizure tendency and the patient is not in non-convulsive status epilepticus.   Weston SettleShervin Jaysin Gayler, MS, MD

## 2017-08-04 ENCOUNTER — Inpatient Hospital Stay (HOSPITAL_COMMUNITY): Payer: Medicare Other

## 2017-08-04 DIAGNOSIS — I63412 Cerebral infarction due to embolism of left middle cerebral artery: Secondary | ICD-10-CM | POA: Diagnosis not present

## 2017-08-04 DIAGNOSIS — I6300 Cerebral infarction due to thrombosis of unspecified precerebral artery: Secondary | ICD-10-CM

## 2017-08-04 LAB — BASIC METABOLIC PANEL
ANION GAP: 9 (ref 5–15)
Anion gap: 16 — ABNORMAL HIGH (ref 5–15)
BUN: 7 mg/dL (ref 6–20)
CALCIUM: 9.6 mg/dL (ref 8.9–10.3)
CALCIUM: 9 mg/dL (ref 8.9–10.3)
CHLORIDE: 106 mmol/L (ref 101–111)
CO2: 19 mmol/L — AB (ref 22–32)
CO2: 22 mmol/L (ref 22–32)
Chloride: 103 mmol/L (ref 101–111)
Creatinine, Ser: 1.01 mg/dL (ref 0.61–1.24)
Creatinine, Ser: 0.93 mg/dL (ref 0.61–1.24)
GFR calc Af Amer: >60 mL/min (ref >60)
GFR calc Af Amer: >60 mL/min (ref >60)
GFR calc non Af Amer: >60 mL/min (ref >60)
GLUCOSE: 118 mg/dL — AB (ref 65–99)
GLUCOSE: 139 mg/dL — AB (ref 65–99)
Potassium: 4.9 mmol/L (ref 3.5–5.1)
Potassium: 3.7 mmol/L (ref 3.5–5.1)
Sodium: 138 mmol/L (ref 135–145)
Sodium: 137 mmol/L (ref 135–145)

## 2017-08-04 LAB — CBC
HEMATOCRIT: 40.2 % (ref 39.0–52.0)
Hemoglobin: 14 g/dL (ref 13.0–17.0)
MCH: 31.7 pg (ref 26.0–34.0)
MCHC: 34.8 g/dL (ref 30.0–36.0)
MCV: 91.2 fL (ref 78.0–100.0)
PLATELETS: 170 10*3/uL (ref 150–400)
RBC: 4.41 MIL/uL (ref 4.22–5.81)
RDW: 13.5 % (ref 11.5–15.5)
WBC: 8.3 10*3/uL (ref 4.0–10.5)

## 2017-08-04 LAB — GLUCOSE, CAPILLARY
Glucose-Capillary: 100 mg/dL — ABNORMAL HIGH (ref 65–99)
Glucose-Capillary: 140 mg/dL — ABNORMAL HIGH (ref 65–99)

## 2017-08-04 MED ORDER — QUETIAPINE FUMARATE 25 MG PO TABS: 25.0000 mg | ORAL_TABLET | Freq: Every day | ORAL | 0 refills | Status: DC

## 2017-08-04 MED ORDER — RESOURCE THICKENUP CLEAR PO POWD: 1.0000 g | ORAL | 0 refills | Status: DC | PRN

## 2017-08-04 MED ORDER — LOSARTAN POTASSIUM 25 MG PO TABS: 25.0000 mg | ORAL_TABLET | Freq: Every day | ORAL | 0 refills | Status: DC

## 2017-08-04 MED ORDER — GLUCERNA SHAKE PO LIQD: 237.0000 mL | Freq: Three times a day (TID) | ORAL | 0 refills | Status: DC

## 2017-08-04 MED ORDER — ASPIRIN 325 MG PO TBEC: 325.0000 mg | DELAYED_RELEASE_TABLET | Freq: Every day | ORAL | 0 refills | Status: DC

## 2017-08-04 MED ORDER — SODIUM CHLORIDE 0.9 % IV BOLUS (SEPSIS)
1000.0000 mL | Freq: Once | INTRAVENOUS | Status: AC
  Administered 2017-08-04: 1000 mL via INTRAVENOUS

## 2017-08-04 MED ORDER — POLYETHYLENE GLYCOL 3350 17 G PO PACK: 17.0000 g | PACK | Freq: Every day | ORAL | 0 refills | Status: DC | PRN

## 2017-08-04 NOTE — Progress Notes (Signed)
STROKE TEAM PROGRESS NOTE   SUBJECTIVE (INTERVAL HISTORY) His daughter and grandson are  at the bedside. Pt  Is calm and sitting at bedside. He wants to go home OBJECTIVE Temp:  [97.7 F (36.5 C)-98.2 F (36.8 C)] 97.7 F (36.5 C) (11/11 0933) Pulse Rate:  [79-90] 90 (11/11 0933) Cardiac Rhythm: Normal sinus rhythm (11/11 0700) Resp:  [18-20] 18 (11/11 0933) BP: (136-156)/(94-96) 156/94 (11/11 0933) SpO2:  [97 %-100 %] 97 % (11/11 0933)  Recent Labs  Lab 08/02/17 2118 08/03/17 0828 08/03/17 1142 08/03/17 1645 08/04/17 0728  GLUCAP 79 108* 127* 107* 100*   Recent Labs  Lab 08/01/17 1440 08/02/17 0342 08/03/17 0457 08/04/17 1130  NA 133* 135 137 138  K 3.9 3.1* 3.7 4.9  CL 100* 105 105 103  CO2 22 22 25  19*  GLUCOSE 114* 83 98 118*  BUN 9 7 5* <5*  CREATININE 0.91 0.75 0.81 1.01  CALCIUM 8.7* 8.4* 8.9 9.6   Recent Labs  Lab 08/01/17 1440  AST 39  ALT 26  ALKPHOS 58  BILITOT 1.5*  PROT 7.4  ALBUMIN 4.0   Recent Labs  Lab 08/01/17 1440 08/02/17 0342 08/03/17 0457 08/04/17 1130  WBC 9.8 7.5 6.7 8.3  NEUTROABS 6.4  --   --   --   HGB 13.2 12.3* 12.8* 14.0  HCT 37.1* 35.4* 36.7* 40.2  MCV 90.0 89.4 90.0 91.2  PLT 161 137* 155 170   No results for input(s): CKTOTAL, CKMB, CKMBINDEX, TROPONINI in the last 168 hours. No results for input(s): LABPROT, INR in the last 72 hours. No results for input(s): COLORURINE, LABSPEC, PHURINE, GLUCOSEU, HGBUR, BILIRUBINUR, KETONESUR, PROTEINUR, UROBILINOGEN, NITRITE, LEUKOCYTESUR in the last 72 hours.  Invalid input(s): APPERANCEUR     Component Value Date/Time   CHOL 118 08/03/2017 0457   TRIG 70 08/03/2017 0457   HDL 32 (L) 08/03/2017 0457   CHOLHDL 3.7 08/03/2017 0457   VLDL 14 08/03/2017 0457   LDLCALC 72 08/03/2017 0457   Lab Results  Component Value Date   HGBA1C 5.6 08/03/2017   No results found for: LABOPIA, COCAINSCRNUR, LABBENZ, AMPHETMU, THCU, LABBARB  No results for input(s): ETH in the last 168  hours.  I have personally reviewed the radiological images below and agree with the radiology interpretations.  Ct Head Wo Contrast 08/01/2017 IMPRESSION: 1. Evolving Left MCA infarct (ASPECTS 6) likely from a Left MCA large vessel occlusion. 2. Possible petechial hemorrhage but no malignant hemorrhagic transformation. Mild intracranial mass effect including 2 mm of rightward midline shift. 3. Sequelae of right MCA white matter infarcts in July with right hemisphere encephalomalacia. Underlying advanced chronic white matter disease.  CTA head and neck No significant carotid or vertebral artery stenosis in the neck.Extensive intracranial atherosclerotic disease.Moderate stenosis right PCA and mild stenosis left PCA.Right M1 segment occluded.  Moderate stenosis right A1 segment.Moderate stenosis left supraclinoid internal carotid artery.Diffusely diseased left M1 segment with decreased flow in the inferior division left MCA due to acute infarct  2D Echocardiogram  pending   PHYSICAL EXAM  Temp:  [97.7 F (36.5 C)-98.2 F (36.8 C)] 97.7 F (36.5 C) (11/11 0933) Pulse Rate:  [79-90] 90 (11/11 0933) Resp:  [18-20] 18 (11/11 0933) BP: (136-156)/(94-96) 156/94 (11/11 0933) SpO2:  [97 %-100 %] 97 % (11/11 0933)  General - Well nourished, well developed, agitated, confusion, global aphasia.  Ophthalmologic - fundi not visualized due to noncooperation.  Cardiovascular - Regular rate and rhythm with no murmur.  Neuro - awake, alert,  non-English speaker. Noncooperation with exam. Daughter acts as interpreter.   .  Not able to follow simple commands.  Not tracking objects, no blinking to visual threat bilaterally. ? Blindness.  PERRL, EOMI to both directions, facial symmetrical, tongue midline.  Moving all extremities symmetrically. Sensation, coordination and gait not tested.   ASSESSMENT/PLAN [name] is a 76 y.o. male with history of diabetes, hypertension, history of stroke admitted  for confusion, agitation and aphasia. No tPA given due to out of window.    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 cardioembolic source  Resultant confusion, agitation, global aphasia  CT head left MCA territory moderate to large acute infarct.  Chronic right MCA patchy infarcts.  MRI not able to perform due to agitation  CTA head and neck  As above  2D Echo  pending  EEG focal slowing on left but no seizures  LDL 72  HgbA1c 5.6  TSH 6.37  /B12 404  SCDs for VTE prophylaxis  Diet heart healthy/carb modified Room service appropriate? Yes; Fluid consistency: Thin   clopidogrel 75 mg daily prior to admission, now on clopidogrel 75 mg daily. Recommend resume DAPT with ASA 325mg  and plavix 75mg  for stroke prevention  Ongoing aggressive stroke risk factor management  Therapy recommendations: Pending  Disposition: Pending  Agitation/confusion  Multifactorial including new left MCA stroke, history of stroke, baseline cognitive impairment, baseline poor vision  EEG negative for seizures  Received Haldol 2 mg IV and Ativan 1 mg IV, not effective  Recommend further sedation with Haldol 5 mg IM and Ativan 2 m IV  Agree with Seroquel 25 twice daily  History of stroke  03/26/17 -MRI showed right MCA scattered infarcts, white matter infarcts and deep watershed pattern -MRI showed right M1 occlusion, 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  High-grade left MCA bifurcation and bilateral P2 segment stenosis.  Carotid Doppler unremarkable.  EF 55-60%.  LDL 126 and A1c 5.6.  He is stroke likely due to large vessel atherosclerosis, put on dual antiplatelet for 3 months along with Lipitor 80.  BP goal 130-150  Readmitted 04/02/17 - MRI showed further progression of acute right MCA territory infarct -also had UTI with encephalopathy -continued on dual antiplatelet as well as  Lipitor -EEG showed right temporal slowing with diffuse slowing background.  Follow-up with Dr. Marjory Simon on 05/29/17 - patient doing well, poor vision- continued above treatment plan   Diabetes  HgbA1c 5.6  goal < 7.0  Controlled  Home meds including metformin and glipizide  CBG monitoring  SSI  Hypertension Stable Permissive hypertension (OK if <220/120) for 24-48 hours post stroke and then gradually normalized within 5-7 days.  Long term BP goal 130-150 due to severe intracranial occlusion/stenosis  Hyperlipidemia  Home meds: Lipitor 80  LDL 72, goal < 70  Lipitor 80 resumed  Continue statin at discharge  Other Stroke Risk Factors  Advanced age  Hx stroke/TIA  Other Active Problems  Hypokalemia  Hyponatremia  Language barrier  Poor vision  Hospital day # 3  DC patient home on aspirin and plavix. No need for repeat echo as he had one in July 2018. F/U in stroke clinic in 6 weeks.D/w patient, daughter and Dr Allena KatzPatel.Stroke team will sign off.   Delia HeadyPramod Chidi Shirer, MD Stroke Neurology 08/04/2017 2:56 PM    To contact Stroke Continuity provider, please refer to [email or web address]. After hours, contact General Neurology

## 2017-08-04 NOTE — Progress Notes (Signed)
PT Cancellation Note  Patient Details Name: [name] MRN: [id number] DOB: 11/11/1940   Cancelled Treatment:    Reason Eval/Treat Not Completed: Fatigue/lethargy limiting ability to participate. Pt continues to be unable to participate. PT will check back when pt is able.    Colin BroachSabra M. Abegail Kloeppel PT, DPT    08/04/2017, 11:14 AM

## 2017-08-06 NOTE — Discharge Summary (Signed)
[facility] Discharge Summary   Patient: [name] ZOX:[id number]   PCP: Fleet Contras, MD DOB: 09/20/1941   Date of admission: 08/01/2017   Date of discharge: 08/04/2017     Discharge Diagnoses:  Principal Problem:   Acute CVA / Lt MCA 08/01/2017, Rt MCA 04/02/2017 Active Problems:   HYPERTENSION, BENIGN ESSENTIAL   Acute ischemic left MCA stroke (HCC)   Cytotoxic cerebral edema (HCC)   History of stroke   Agitation   Admitted From: Home Disposition: Home with home health  Recommendations for Outpatient Follow-up:  1. Follow-up with PCP in 1 week. 2. Follow-up with neurology in 1 month.  Follow-up Information    Fleet Contras, MD. Schedule an appointment as soon as possible for a visit in 1 week(s).   Specialty:  Internal Medicine Contact information: [address] [place] [address] [phone]        [facility]. Schedule an appointment as soon as possible for a visit in 1 month(s).   Specialty:  Neurology Contact information: [address] [place] [address] [phone]         Diet recommendation: Dysphagia type II diet nectar thick  Activity: The patient is advised to gradually reintroduce usual activities.  Discharge Condition: good  Code Status: full code  History of present illness: As per the H and P dictated on admission, "[name]  is a 76 y.o. male with past medical history relevant for diabetes, hypertension, and prior right MCA stroke  (03/2017) presents with altered mental status. History is taken from the daughter as the patient both does not speak this language but also is altered. The patient apparently was normal yesterday up until about 1 PM. At 2 PM he has been confused and been confused since. The patient has both been speaking nonsense and words they do not understand as well as not remembering stuff he normally would. He has also been urinating and defecating in  different rooms that are not bathrooms. He had underwear on his head and was trying to go through a wall instead of a door. It was noted that he has a cough and was recently in the ED for the same. The cough is improving. No fevers.  Most of the above history obtained from ED provider and ED records as family not available at time of my evaluation, there is a one-to-one sitter at bedside  Patient is confused at times combative pulling out lines and monitor wires  Appears to be moving all extremities, globally confused and incomprehensible  Again history is limited due to confusion "  Hospital Course:  Summary of his active problems in the hospital is as following. 1.  Left acute MCA infarct. Petechial hemorrhage. Vasogenic edema with mass-effect with 2 mm midline shift. Neurology was consulted. Patient has prior history of right MCA infarct with encephalomalacia developing there. Last admission patient was on aspirin and Plavix. Neurology recommends to continue aspirin and Plavix right now as well. Repeat CTA head and neck was performed which shows stable midline shift without any worsening as well as no further worsening of the petechial hemorrhages. No significant stenosis amenable for intervention identified on CTA. PT OT evaluation was not completed due to patient's lack of participation in therapy. Although patient was seen walking around the hallway with a walker without any difficulty. Home health was arranged for home therapy including speech therapy. Speech therapy recommended dysphagia type II diet with nectar thick liquid. Echocardiogram was refused by the patient.  Discharged on full dose aspirin with Plavix.  2.  Acute encephalopathy, likely multifactorial. Delirium from acute CVA. Less likely metabolic.  No evidence of active infection. 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 Patientcontinue to refuse to talk with interpreter on the phone, and  on my exam on 08/02/2017 patient's speech was not clear enough for the interpreter to understand on the phone as well. Patient will be discharged with speech therapy consult as an outpatient. Agitation resolved on the day of the discharge with use of Seroquel will continue on discharge.  3.  Type 2 diabetes mellitus. Continuing home regimen.  4.  Hypokalemia. Replaced. Monitor.  All other chronic medical condition were stable during the hospitalization.  Home health was arranged by Child psychotherapist and case Production designer, theatre/television/film. On the day of the discharge the patient's vitals were stable, and no other acute medical condition were reported by patient. the patient was felt safe to be discharge at home with home health.  Procedures and Results:  eeg    Consultations:  Neurology  DISCHARGE MEDICATION: Discharge Medication List as of 08/04/2017  6:04 PM    START taking these medications   Details  aspirin EC 325 MG EC tablet Take 1 tablet (325 mg total) daily by mouth., Starting Mon 08/05/2017, Normal    feeding supplement, GLUCERNA SHAKE, (GLUCERNA SHAKE) LIQD Take 237 mLs 3 (three) times daily between meals by mouth., Starting Sun 08/04/2017, Normal    losartan (COZAAR) 25 MG tablet Take 1 tablet (25 mg total) daily by mouth., Starting Sun 08/04/2017, Until Mon 08/04/2018, Normal    Maltodextrin-Xanthan Gum (RESOURCE THICKENUP CLEAR) POWD Take 1 g as needed by mouth., Starting Sun 08/04/2017, Normal    polyethylene glycol (MIRALAX / GLYCOLAX) packet Take 17 g daily as needed by mouth for mild constipation., Starting Sun 08/04/2017, Normal    QUEtiapine (SEROQUEL) 25 MG tablet Take 1 tablet (25 mg total) at bedtime by mouth., Starting Sun 08/04/2017, Normal      CONTINUE these medications which have NOT CHANGED   Details  albuterol (PROVENTIL HFA;VENTOLIN HFA) 108 (90 Base) MCG/ACT inhaler Inhale 2 puffs into the lungs every 6 (six) hours as needed for wheezing or shortness of breath.,  Historical Med    atorvastatin (LIPITOR) 80 MG tablet Take 1 tablet (80 mg total) by mouth daily at 6 PM., Starting Thu 04/11/2017, Normal    benzonatate (TESSALON) 100 MG capsule Take 1 capsule (100 mg total) by mouth 3 (three) times daily as needed for cough., Starting Tue 07/16/2017, Print    citalopram (CELEXA) 10 MG tablet Take 1 tablet (10 mg total) by mouth at bedtime., Starting Thu 04/11/2017, Normal    clopidogrel (PLAVIX) 75 MG tablet Take 1 tablet (75 mg total) by mouth daily., Starting Thu 04/11/2017, Normal    glimepiride (AMARYL) 4 MG tablet Take 0.5 tablets (2 mg total) by mouth daily with breakfast., Starting Thu 04/11/2017, Normal    glucose blood test strip Use as instructed, Print    Lancets (ONETOUCH ULTRASOFT) lancets Check blood sugars twice a day, Print    loratadine (CLARITIN) 10 MG tablet Take 10 mg by mouth daily. , Starting Mon 12/10/2011, Historical Med    meclizine (ANTIVERT) 12.5 MG tablet Take 12.5 mg by mouth 3 (three) times daily as needed for dizziness., Historical Med    metFORMIN (GLUCOPHAGE) 500 MG tablet Take 500 mg by mouth 2 (two) times daily with a meal., Historical Med    traZODone (DESYREL) 50  MG tablet Take 50 mg by mouth at bedtime., Historical Med      STOP taking these medications     amoxicillin-clavulanate (AUGMENTIN) 875-125 MG tablet      losartan-hydrochlorothiazide (HYZAAR) 100-12.5 MG tablet        No Known Allergies  Discharge Exam: Filed Weights   08/01/17 2046 08/03/17 0514  Weight: 65.4 kg (144 lb 3.2 oz) 62.6 kg (138 lb 1.6 oz)   Vitals:   08/04/17 0933 08/04/17 1507  BP: (!) 156/94 (!) 144/91  Pulse: 90 89  Resp: 18 20  Temp: 97.7 F (36.5 C) 97.8 F (36.6 C)  SpO2: 97% 100%   General: Appear in no distress, no Rash; Oral Mucosa moist Cardiovascular: S1 and S2 Present, no Murmur, no JVD Respiratory: Bilateral Air entry present and Clear to Auscultation, no Crackles, no wheezes Abdomen: Bowel Sound present,  Soft and no tenderness Extremities: no Pedal edema, no calf tenderness Neurology: Still remains confused per family, bilateral equal strength.  The results of significant diagnostics from this hospitalization (including imaging, microbiology, ancillary and laboratory) are listed below for reference.    Significant Diagnostic Studies: Ct Angio Head W Or Wo Contrast  Result Date: 08/03/2017 CLINICAL DATA:  Stroke EXAM: CT ANGIOGRAPHY HEAD AND NECK TECHNIQUE: Multidetector CT imaging of the head and neck was performed using the standard protocol during bolus administration of intravenous contrast. Multiplanar CT image reconstructions and MIPs were obtained to evaluate the vascular anatomy. Carotid stenosis measurements (when applicable) are obtained utilizing NASCET criteria, using the distal internal carotid diameter as the denominator. CONTRAST:  50mL ISOVUE-370 IOPAMIDOL (ISOVUE-370) INJECTION 76% COMPARISON:  CT 08/01/2017 FINDINGS: CT HEAD FINDINGS Brain: Low-density edema in the left anterior temporal lobe, left lateral temporal lobe, and left posterior temporal lobe compatible with subacute infarct as noted on the prior study. No significant infarct extension. There appears to be a small amount of petechial hemorrhage in the infarct which has developed in the interval. Edema and mild midline shift to the right of approximately 2 mm is unchanged. Generalized moderate atrophy. Chronic ischemic changes in the right MCA territory in the deep white matter. Chronic microvascular ischemia in the white matter bilaterally. Vascular: Negative for hyperdense vessel. Diffuse atherosclerotic calcification. Skull: Negative Sinuses: Negative Orbits: Negative Review of the MIP images confirms the above findings CTA NECK FINDINGS Aortic arch: Minimal atherosclerotic disease aortic arch. Three vessel branching arch. Proximal great vessels widely patent without significant atherosclerotic disease. Right carotid system:  Cervical carotid widely patent without atherosclerotic disease or stenosis Left carotid system: Cervical carotid widely patent without atherosclerotic disease or stenosis Vertebral arteries: Left vertebral artery dominant. Both vertebral arteries widely patent to the basilar Skeleton: Cervical spine disc degeneration and spurring. No acute skeletal abnormality. Other neck: Negative Upper chest: Lung apices are clear. Review of the MIP images confirms the above findings CTA HEAD FINDINGS Anterior circulation: Mild atherosclerotic disease right cavernous carotid with mild stenosis of the supraclinoid internal carotid artery on the right. Moderate stenosis right A1 segment. Occlusion of the right M1 segment. Right middle cerebral artery branches are small and perfused via collateral flow. Atherosclerotic disease the supraclinoid internal carotid artery on the left with moderately severe stenosis. Left anterior cerebral artery patent. Small caliber diffusely diseased left M1 segment. Decreased flow in the inferior division left middle cerebral artery due to acute infarct. Superior division widely patent Posterior circulation: Both vertebral arteries are widely patent to the basilar. Basilar mildly disease without significant stenosis. PICA and superior cerebellar  artery patent bilaterally. Moderate stenosis right posterior cerebral artery and mild stenosis left posterior cerebral artery. Venous sinuses: Patent Anatomic variants: None Delayed phase: No abnormal enhancement in the infarct on delayed imaging Review of the MIP images confirms the above findings IMPRESSION: No significant carotid or vertebral artery stenosis in the neck Extensive intracranial atherosclerotic disease. Moderate stenosis right PCA and mild stenosis left PCA Right M1 segment occluded.  Moderate stenosis right A1 segment. Moderate stenosis left supraclinoid internal carotid artery. Diffusely diseased left M1 segment with decreased flow in the  inferior division left MCA due to acute infarct Left anterior, mid, and posterior temporal lobe infarct with diffuse edema 2 mm midline shift. Small amount of petechial hemorrhage in the infarct has developed since the prior study. Electronically Signed   By: Marlan Palauharles  Clark M.D.   On: 08/03/2017 11:42   Dg Chest 2 View  Result Date: 07/16/2017 CLINICAL DATA:  Chest pain and productive cough. EXAM: CHEST  2 VIEW COMPARISON:  04/02/2017 and 03/27/2017 FINDINGS: The heart size and mediastinal contours are within normal limits. New bilateral peribronchial thickening with slight accentuation of the interstitial markings at the left lung base. No consolidative infiltrate. No effusions. The visualized skeletal structures are unremarkable. IMPRESSION: New bronchitic changes. Electronically Signed   By: Francene BoyersJames  Maxwell M.D.   On: 07/16/2017 09:13   Ct Head Wo Contrast  Addendum Date: 08/01/2017   ADDENDUM REPORT: 08/01/2017 14:44 ADDENDUM: Study discussed by telephone with Dr. Tilden FossaELIZABETH REES on 08/01/2017 at 1435 hours. Electronically Signed   By: Odessa FlemingH  Hall M.D.   On: 08/01/2017 14:44   Result Date: 08/01/2017 CLINICAL DATA:  76 year old male with confusion and abnormal speech. Right MCA M1 occlusion in July. EXAM: CT HEAD WITHOUT CONTRAST TECHNIQUE: Contiguous axial images were obtained from the base of the skull through the vertex without intravenous contrast. COMPARISON:  Brain MRIs and head CTs in July this year, 04/02/2017 and earlier. FINDINGS: Brain: Hypodense cytotoxic edema in the left MCA territory affecting the insula, the majority of the anterior and superior left temporal lobe, and tracking into the right parietal lobe. ASPECTS 6 (abnormal insula, M2, M3, m6). Intracranial mass effect including about 2 mm of rightward midline shift. Partial effacement of the left lateral ventricle. Possible mild petechial hemorrhage. No malignant hemorrhagic transformation. Interval encephalomalacia in the right MCA  white matter. Underlying chronic bilateral confluent white matter hypodensity. No ventriculomegaly. No acute intracranial hemorrhage identified. Vascular: Calcified atherosclerosis at the skull base. No suspicious intracranial vascular hyperdensity. Skull: No acute osseous abnormality identified. Sinuses/Orbits: Visualized paranasal sinuses and mastoids are stable and well pneumatized. Other: No acute orbit or scalp soft tissue findings. IMPRESSION: 1. Evolving Left MCA infarct (ASPECTS 6) likely from a Left MCA large vessel occlusion. 2. Possible petechial hemorrhage but no malignant hemorrhagic transformation. Mild intracranial mass effect including 2 mm of rightward midline shift. 3. Sequelae of right MCA white matter infarcts in July with right hemisphere encephalomalacia. Underlying advanced chronic white matter disease. Electronically Signed: By: Odessa FlemingH  Hall M.D. On: 08/01/2017 14:31   Ct Angio Neck W Or Wo Contrast  Result Date: 08/03/2017 CLINICAL DATA:  Stroke EXAM: CT ANGIOGRAPHY HEAD AND NECK TECHNIQUE: Multidetector CT imaging of the head and neck was performed using the standard protocol during bolus administration of intravenous contrast. Multiplanar CT image reconstructions and MIPs were obtained to evaluate the vascular anatomy. Carotid stenosis measurements (when applicable) are obtained utilizing NASCET criteria, using the distal internal carotid diameter as the denominator. CONTRAST:  50mL  ISOVUE-370 IOPAMIDOL (ISOVUE-370) INJECTION 76% COMPARISON:  CT 08/01/2017 FINDINGS: CT HEAD FINDINGS Brain: Low-density edema in the left anterior temporal lobe, left lateral temporal lobe, and left posterior temporal lobe compatible with subacute infarct as noted on the prior study. No significant infarct extension. There appears to be a small amount of petechial hemorrhage in the infarct which has developed in the interval. Edema and mild midline shift to the right of approximately 2 mm is unchanged.  Generalized moderate atrophy. Chronic ischemic changes in the right MCA territory in the deep white matter. Chronic microvascular ischemia in the white matter bilaterally. Vascular: Negative for hyperdense vessel. Diffuse atherosclerotic calcification. Skull: Negative Sinuses: Negative Orbits: Negative Review of the MIP images confirms the above findings CTA NECK FINDINGS Aortic arch: Minimal atherosclerotic disease aortic arch. Three vessel branching arch. Proximal great vessels widely patent without significant atherosclerotic disease. Right carotid system: Cervical carotid widely patent without atherosclerotic disease or stenosis Left carotid system: Cervical carotid widely patent without atherosclerotic disease or stenosis Vertebral arteries: Left vertebral artery dominant. Both vertebral arteries widely patent to the basilar Skeleton: Cervical spine disc degeneration and spurring. No acute skeletal abnormality. Other neck: Negative Upper chest: Lung apices are clear. Review of the MIP images confirms the above findings CTA HEAD FINDINGS Anterior circulation: Mild atherosclerotic disease right cavernous carotid with mild stenosis of the supraclinoid internal carotid artery on the right. Moderate stenosis right A1 segment. Occlusion of the right M1 segment. Right middle cerebral artery branches are small and perfused via collateral flow. Atherosclerotic disease the supraclinoid internal carotid artery on the left with moderately severe stenosis. Left anterior cerebral artery patent. Small caliber diffusely diseased left M1 segment. Decreased flow in the inferior division left middle cerebral artery due to acute infarct. Superior division widely patent Posterior circulation: Both vertebral arteries are widely patent to the basilar. Basilar mildly disease without significant stenosis. PICA and superior cerebellar artery patent bilaterally. Moderate stenosis right posterior cerebral artery and mild stenosis left  posterior cerebral artery. Venous sinuses: Patent Anatomic variants: None Delayed phase: No abnormal enhancement in the infarct on delayed imaging Review of the MIP images confirms the above findings IMPRESSION: No significant carotid or vertebral artery stenosis in the neck Extensive intracranial atherosclerotic disease. Moderate stenosis right PCA and mild stenosis left PCA Right M1 segment occluded.  Moderate stenosis right A1 segment. Moderate stenosis left supraclinoid internal carotid artery. Diffusely diseased left M1 segment with decreased flow in the inferior division left MCA due to acute infarct Left anterior, mid, and posterior temporal lobe infarct with diffuse edema 2 mm midline shift. Small amount of petechial hemorrhage in the infarct has developed since the prior study. Electronically Signed   By: Marlan Palau M.D.   On: 08/03/2017 11:42   Dg Chest Portable 1 View  Result Date: 08/01/2017 CLINICAL DATA:  Initial evaluation for acute cough, altered mental status. EXAM: PORTABLE CHEST 1 VIEW COMPARISON:  Prior radiograph from 07/16/2017. FINDINGS: Cardiac and mediastinal silhouettes are stable in size and contour, and remain within normal limits. Aortic atherosclerosis. Lungs are hypoinflated. Mild patchy left basilar opacity, favored to reflect atelectasis, although early/developing infiltrate could also be considered. No other focal airspace disease. No pulmonary edema or pleural effusion. No pneumothorax. No acute osseous abnormality. IMPRESSION: 1. Shallow lung inflation with associated mild patchy left basilar opacity. Atelectasis is favored, although small infiltrate could be considered in the correct clinical setting. 2. No other active cardiopulmonary disease. 3. Aortic atherosclerosis. Electronically Signed   By: Sharlet Salina  Phill MyronMcClintock M.D.   On: 08/01/2017 15:21    Microbiology: No results found for this or any previous visit (from the past 240 hour(s)).   Labs: CBC: Recent Labs    Lab 08/01/17 1440 08/02/17 0342 08/03/17 0457 08/04/17 1130  WBC 9.8 7.5 6.7 8.3  NEUTROABS 6.4  --   --   --   HGB 13.2 12.3* 12.8* 14.0  HCT 37.1* 35.4* 36.7* 40.2  MCV 90.0 89.4 90.0 91.2  PLT 161 137* 155 170   Basic Metabolic Panel: Recent Labs  Lab 08/01/17 1440 08/02/17 0342 08/03/17 0457 08/04/17 1130 08/04/17 1645  NA 133* 135 137 138 137  K 3.9 3.1* 3.7 4.9 3.7  CL 100* 105 105 103 106  CO2 22 22 25  19* 22  GLUCOSE 114* 83 98 118* 139*  BUN 9 7 5* <5* 7  CREATININE 0.91 0.75 0.81 1.01 0.93  CALCIUM 8.7* 8.4* 8.9 9.6 9.0   Liver Function Tests: Recent Labs  Lab 08/01/17 1440  AST 39  ALT 26  ALKPHOS 58  BILITOT 1.5*  PROT 7.4  ALBUMIN 4.0   No results for input(s): LIPASE, AMYLASE in the last 168 hours. No results for input(s): AMMONIA in the last 168 hours. Cardiac Enzymes: No results for input(s): CKTOTAL, CKMB, CKMBINDEX, TROPONINI in the last 168 hours. BNP (last 3 results) No results for input(s): BNP in the last 8760 hours. CBG: Recent Labs  Lab 08/03/17 0828 08/03/17 1142 08/03/17 1645 08/04/17 0728 08/04/17 1655  GLUCAP 108* 127* 107* 100* 140*   Time spent: 35 minutes  Signed:  Kristoffer Bala  [facility] 08/04/2017   , 8:36 AM

## 2017-08-07 ENCOUNTER — Telehealth: Payer: Self-pay | Admitting: Diagnostic Neuroimaging

## 2017-08-07 NOTE — Telephone Encounter (Signed)
Pt's daughter called to schedule a f/u ED appt for another stroke. Pt should be seen somewhere around 12/11 per ED notes. There are no available times to schedule, please call to get this pt seen. Thank you!

## 2017-08-08 NOTE — Telephone Encounter (Signed)
Pt was discharged on Sunday and apparently no HH agency was set up.  [facility] will now seeing pt.  Spoke to [place] in new referrals.

## 2017-08-08 NOTE — Telephone Encounter (Addendum)
I called daughter and made appt for 08-26-17 at 230pm for f/u post stroke 08-01-17.  [facility] stated that was [facility] by SW/CM.  I called [facility] where daughter states they used last time from previous stroke.  Receptionist (alecia,) was going to f/u up on this and call me back.

## 2017-08-26 ENCOUNTER — Encounter: Payer: Self-pay | Admitting: Diagnostic Neuroimaging

## 2017-08-26 ENCOUNTER — Ambulatory Visit (INDEPENDENT_AMBULATORY_CARE_PROVIDER_SITE_OTHER): Payer: Medicare Other | Admitting: Diagnostic Neuroimaging

## 2017-08-26 VITALS — BP 152/88 | HR 79

## 2017-08-26 DIAGNOSIS — R413 Other amnesia: Secondary | ICD-10-CM | POA: Diagnosis not present

## 2017-08-26 DIAGNOSIS — R269 Unspecified abnormalities of gait and mobility: Secondary | ICD-10-CM | POA: Diagnosis not present

## 2017-08-26 DIAGNOSIS — I6389 Other cerebral infarction: Secondary | ICD-10-CM

## 2017-08-26 DIAGNOSIS — I6330 Cerebral infarction due to thrombosis of unspecified cerebral artery: Secondary | ICD-10-CM

## 2017-08-26 NOTE — Progress Notes (Signed)
[facility]  PATIENT: [name] DOB: Sep 08, 1941  REFERRING CLINICIAN: Sylvie Farrier / stroke hospital team HISTORY FROM: patient, via daughter, and chart review REASON FOR VISIT: new consult / existing patient   HISTORICAL  CHIEF COMPLAINT:  Chief Complaint  Patient presents with  . Cerebrovascular Accident    rm 6, dgtr- Sita, interpreter- Bishau Paudal, "second stroke in Nov, very confused since then; lives with dgtr"  . Follow-up    hospital FU    HISTORY OF PRESENT ILLNESS:   UPDATE (08/26/17, VRP): Since last visit, was in hospital for another stroke (large left MCA stroke in Nov 2018) due to M1/M2 stenosis vs cardioembolic source.  Since that time patient has had significant cognitive deficits, sleep difficulty, language deficits, and needs 24-hour supervision.  This is caused significant stress for the family.  They are unable to find or afford an outside caregiver so that they are able to leave patient at home while they go to work.  Patient has not been cooperative with home health agency physical therapy.  Sometimes he does not want to take his medications.  PRIOR HPI (05/29/17): 76 year old male with diabetes, hypertension, here for evaluation of stroke hospital discharge follow-up. 03/24/17 patient had onset of dizziness and balance difficulty. Symptoms continued for several days. He was brought to hospital on 03/26/17. He reported left-sided weakness, was admitted for stroke workup. Initially patient was noted to have hyperdense left MCA changes on CT but this was felt to be related to atherosclerosis. On MRI patient had multiple right MCA watershed ischemic infarcts. Stroke workup was completed. He was discharged on dual antiplatelet therapy 03/28/17. Patient returned on spot 04/02/17 due to inability to speak, coughing and difficulty swallowing. He was admitted for further workup. MRI showed extension of right frontoparietal ischemic infarcts. In addition he was  diagnosed with dehydration and UTI. Since discharge, overall stable. Using a wheelchair. Vision is poor. Needs help with bathing, dressing.    REVIEW OF SYSTEMS: Full 14 system review of systems performed and negative with exception of: Memory loss dizziness headache behavior problem decreased concentration hallucination walking difficulty insomnia loss of vision.  ALLERGIES: No Known Allergies  HOME MEDICATIONS: Outpatient Medications Prior to Visit  Medication Sig Dispense Refill  . albuterol (PROVENTIL HFA;VENTOLIN HFA) 108 (90 Base) MCG/ACT inhaler Inhale 2 puffs into the lungs every 6 (six) hours as needed for wheezing or shortness of breath.    Marland Kitchen aspirin EC 325 MG EC tablet Take 1 tablet (325 mg total) daily by mouth. 30 tablet 0  . atorvastatin (LIPITOR) 80 MG tablet Take 1 tablet (80 mg total) by mouth daily at 6 PM. 30 tablet 0  . citalopram (CELEXA) 10 MG tablet Take 1 tablet (10 mg total) by mouth at bedtime. 30 tablet 0  . clopidogrel (PLAVIX) 75 MG tablet Take 1 tablet (75 mg total) by mouth daily. 30 tablet 0  . feeding supplement, GLUCERNA SHAKE, (GLUCERNA SHAKE) LIQD Take 237 mLs 3 (three) times daily between meals by mouth. 30 Can 0  . glimepiride (AMARYL) 4 MG tablet Take 0.5 tablets (2 mg total) by mouth daily with breakfast. 30 tablet 0  . glucose blood test strip Use as instructed 100 each 12  . Lancets (ONETOUCH ULTRASOFT) lancets Check blood sugars twice a day 60 each 5  . LINZESS 145 MCG CAPS capsule     . loratadine (CLARITIN) 10 MG tablet Take 10 mg by mouth daily.     Marland Kitchen losartan-hydrochlorothiazide (HYZAAR) 50-12.5  MG tablet     . Maltodextrin-Xanthan Gum (RESOURCE THICKENUP CLEAR) POWD Take 1 g as needed by mouth. 125 g 0  . meclizine (ANTIVERT) 12.5 MG tablet Take 12.5 mg by mouth 3 (three) times daily as needed for dizziness.    . metFORMIN (GLUCOPHAGE) 500 MG tablet Take 500 mg by mouth 2 (two) times daily with a meal.    . polyethylene glycol (MIRALAX /  GLYCOLAX) packet Take 17 g daily as needed by mouth for mild constipation. 14 each 0  . QUEtiapine (SEROQUEL) 25 MG tablet Take 1 tablet (25 mg total) at bedtime by mouth. 30 tablet 0  . traZODone (DESYREL) 50 MG tablet Take 50 mg by mouth at bedtime.    Marland Kitchen losartan (COZAAR) 25 MG tablet Take 1 tablet (25 mg total) daily by mouth. 30 tablet 0  . benzonatate (TESSALON) 100 MG capsule Take 1 capsule (100 mg total) by mouth 3 (three) times daily as needed for cough. (Patient not taking: Reported on 08/01/2017) 21 capsule 0   No facility-administered medications prior to visit.     PAST MEDICAL HISTORY: Past Medical History:  Diagnosis Date  . Diabetes (HCC)   . Dizziness   . Hypertension   . Stroke [facility])    x 2, last Nov 2018    PAST SURGICAL HISTORY: Past Surgical History:  Procedure Laterality Date  . LAPAROSCOPIC APPENDECTOMY  02/21/2012   Procedure: APPENDECTOMY LAPAROSCOPIC;  Surgeon: Cherylynn Ridges, MD;  Location: [facility] OR;  Service: General;  Laterality: N/A;    FAMILY HISTORY: Family History  Problem Relation Age of Onset  . Diabetes Neg Hx   . Stroke Neg Hx   . Cancer Neg Hx     SOCIAL HISTORY:  Social History   Socioeconomic History  . Marital status: Widowed    Spouse name: Not on file  . Number of children: Not on file  . Years of education: Not on file  . Highest education level: Not on file  Social Needs  . Financial resource strain: Not on file  . Food insecurity - worry: Not on file  . Food insecurity - inability: Not on file  . Transportation needs - medical: Not on file  . Transportation needs - non-medical: Not on file  Occupational History  . Not on file  Tobacco Use  . Smoking status: Never Smoker  . Smokeless tobacco: Never Used  Substance and Sexual Activity  . Alcohol use: No  . Drug use: No  . Sexual activity: Not on file  Other Topics Concern  . Not on file  Social History Narrative   Lives in apt with 2 daughters who care for him.  He  drinks tea in am.       PHYSICAL EXAM  GENERAL EXAM/CONSTITUTIONAL: Vitals:  Vitals:   08/26/17 1501  BP: (!) 152/88  Pulse: 79   There is no height or weight on file to calculate BMI. No exam data present  Patient is in no distress; well developed, nourished and groomed; neck is supple  CARDIOVASCULAR:  Examination of carotid arteries is normal; no carotid bruits  Regular rate and rhythm, no murmurs  Examination of peripheral vascular system by observation and palpation is normal  EYES:  Ophthalmoscopic exam of optic discs and posterior segments is normal; no papilledema or hemorrhages  MUSCULOSKELETAL:  Gait, strength, tone, movements noted in Neurologic exam below  NEUROLOGIC: MENTAL STATUS:  No flowsheet data found.  awake, alert, oriented to person; DOES NOT ANSWER  QUESTIONS APPROPRIATELY; SAYS "LET'S GO; COME ON LET'S GO" IN HIS NATIVE LANGUAGE VIA INTERPRETER; HE FOLLOWS SOME SIMPLE COMMANDS VIA IMITATION  DECR MEMORY  DECR attention and concentration  DECR FLUENCY; comprehension intact FOR SIMPLE COMMANDS; CANNOT NAME  fund of knowledge appropriate  CRANIAL NERVE:   2nd, 3rd, 4th, 6th - pupils equal and reactive to light, visual fields --> DECR ATTENTION THROUGHOUT; NOT FOLLOWING COMMANDS; BLINKS TO THREAT SYMM; extraocular muscles intact, no nystagmus  5th - facial sensation symmetric  7th - facial strength symmetric  8th - hearing intact  9th - palate elevates symmetrically, uvula midline  11th - shoulder shrug symmetric  12th - tongue protrusion midline  MOTOR:   normal bulk and tone  NO PRONATOR DRIFT  BUE 4  BLE 3-4  SENSORY:   normal and symmetric to light touch  COORDINATION:   finger-nose-finger, fine finger movements SLOW  REFLEXES:   deep tendon reflexes TRACE and symmetric  GAIT/STATION:   WALKS WITH CANE    DIAGNOSTIC DATA (LABS, IMAGING, TESTING) - I reviewed patient records, labs, notes, testing and  imaging myself where available.  Lab Results  Component Value Date   WBC 8.3 08/04/2017   HGB 14.0 08/04/2017   HCT 40.2 08/04/2017   MCV 91.2 08/04/2017   PLT 170 08/04/2017      Component Value Date/Time   NA 137 08/04/2017 1645   K 3.7 08/04/2017 1645   CL 106 08/04/2017 1645   CO2 22 08/04/2017 1645   GLUCOSE 139 (H) 08/04/2017 1645   BUN 7 08/04/2017 1645   CREATININE 0.93 08/04/2017 1645   CALCIUM 9.0 08/04/2017 1645   PROT 7.4 08/01/2017 1440   ALBUMIN 4.0 08/01/2017 1440   AST 39 08/01/2017 1440   ALT 26 08/01/2017 1440   ALKPHOS 58 08/01/2017 1440   BILITOT 1.5 (H) 08/01/2017 1440   GFRNONAA >60 08/04/2017 1645   GFRAA >60 08/04/2017 1645   Lab Results  Component Value Date   CHOL 118 08/03/2017   HDL 32 (L) 08/03/2017   LDLCALC 72 08/03/2017   TRIG 70 08/03/2017   CHOLHDL 3.7 08/03/2017   Lab Results  Component Value Date   HGBA1C 5.6 08/03/2017   Lab Results  Component Value Date   VITAMINB12 404 08/03/2017   Lab Results  Component Value Date   TSH 6.379 (H) 08/03/2017    03/26/17 CT head [I reviewed images myself and agree with interpretation. -VRP]  - No intracranial hemorrhage. - Slightly hyperdense left middle cerebral artery M2 branch possibly related to acute thrombosis versus atherosclerotic changes. No other CT evidence of large acute infarct. -  Prominent chronic microvascular changes. This limits evaluation for detection of small or moderate-size infarct. - Global atrophy.  03/27/17 MRI brain / MRA head [I reviewed images myself and agree with interpretation. -VRP]  1. Right M1 occlusion with comparatively mild downstream white matter infarcts, deep watershed pattern. 2. Advanced intracranial atherosclerosis with moderate bilateral ICA and proximal basilar stenosis. High-grade left MCA bifurcation and bilateral P2 segment stenoses. 3. Advanced chronic small vessel ischemia in the cerebral white matter.  04/02/17 MRI brain [I reviewed  images myself and agree with interpretation. -VRP]  - Limited single sequence MRI of the head: Further propagation of acute RIGHT frontoparietal/MCA territory infarct.  03/27/17 carotid u/s - The vertebral arteries appear patent with antegrade flow. - Findings consistent with a 1- 39 percent stenosis involving the right internal carotid artery and the left internal carotid artery.  03/27/17 TTE  -  Normal LV systolic function; mild diastolic dysfunction; mild LVH; mildly dilated aortic root (4.1 cm); sclerotic aortic valve with mild AI.  04/03/17 EEG 1. Structural or physiologic abnormality in the right temporal region. 2. Diffuse cerebral dysfunction that is non-specific in etiology and can be seen with hypoxic/ischemic injury, toxic/metabolic encephalopathies, neurodegenerative disorders, or medication effect.  -  The absence of epileptiform discharges does not rule out a clinical diagnosis of epilepsy.  Clinical correlation is advised.  08/03/17 CTA head / neck [I reviewed images myself and agree with interpretation. -VRP]  - No significant carotid or vertebral artery stenosis in the neck - Extensive intracranial atherosclerotic disease. - Moderate stenosis right PCA and mild stenosis left PCA - Right M1 segment occluded.  Moderate stenosis right A1 segment. - Moderate stenosis left supraclinoid internal carotid artery. Diffusely diseased left M1 segment with decreased flow in the inferior division left MCA due to acute infarct. - Left anterior, mid, and posterior temporal lobe infarct with diffuse edema 2 mm midline shift. Small amount of petechial hemorrhage in the infarct has developed since the prior study.     ASSESSMENT AND PLAN  76 y.o. year old male here with stroke due to intracranial atherosclerosis, hypertension, diabetes.  Unfortunately has had right MCA stroke in July 2018 and left MCA stroke in November 2018.  Significant cognitive, behavioral and physical  deficits.   Dx:  1. Cerebrovascular accident (CVA) due to other mechanism   2. Memory loss   3. Gait difficulty      PLAN:  STROKE PREVENTION - continue aspirin 325mg , plavix 75mg , diabetes control - relaxed BP control (SBP 130-150 ok) - continue atorvastatin - recommend supportive care and palliative approach - may try increased seroquel at bedtime to help with agitation and insomnia  Return if symptoms worsen or fail to improve, for return to PCP.  I reviewed images, labs, notes, records myself. I summarized findings and reviewed with patient and daughter, for this high risk condition (stroke) requiring high complexity decision making.     Suanne MarkerVIKRAM R. Mellie Buccellato, MD 08/26/2017, 3:10 PM Certified in Neurology, Neurophysiology and Neuroimaging  [facility] [address], [address] [place], [place] [address] [phone]

## 2017-08-26 NOTE — Patient Instructions (Signed)
-   continue current medications  - increase quetiapine to 50mg  at bedtime to help with sleep

## 2017-12-23 ENCOUNTER — Encounter: Payer: Self-pay | Admitting: Physical Medicine & Rehabilitation

## 2017-12-23 ENCOUNTER — Encounter: Payer: Medicare Other | Attending: Physical Medicine & Rehabilitation | Admitting: Physical Medicine & Rehabilitation

## 2017-12-23 VITALS — BP 127/79 | HR 70 | Ht 64.0 in | Wt 122.0 lb

## 2017-12-23 DIAGNOSIS — E119 Type 2 diabetes mellitus without complications: Secondary | ICD-10-CM | POA: Insufficient documentation

## 2017-12-23 DIAGNOSIS — G8194 Hemiplegia, unspecified affecting left nondominant side: Secondary | ICD-10-CM | POA: Diagnosis not present

## 2017-12-23 DIAGNOSIS — F329 Major depressive disorder, single episode, unspecified: Secondary | ICD-10-CM | POA: Insufficient documentation

## 2017-12-23 DIAGNOSIS — I69320 Aphasia following cerebral infarction: Secondary | ICD-10-CM | POA: Insufficient documentation

## 2017-12-23 DIAGNOSIS — I63512 Cerebral infarction due to unspecified occlusion or stenosis of left middle cerebral artery: Secondary | ICD-10-CM

## 2017-12-23 DIAGNOSIS — I10 Essential (primary) hypertension: Secondary | ICD-10-CM | POA: Insufficient documentation

## 2017-12-23 DIAGNOSIS — Z8673 Personal history of transient ischemic attack (TIA), and cerebral infarction without residual deficits: Secondary | ICD-10-CM | POA: Diagnosis present

## 2017-12-23 MED ORDER — CITALOPRAM HYDROBROMIDE 20 MG PO TABS: 20.0000 mg | ORAL_TABLET | Freq: Every day | ORAL | 6 refills | Status: DC

## 2017-12-23 NOTE — Progress Notes (Signed)
Subjective:    Patient ID: [name], male    DOB: 09/03/1941, 77 y.o.   MRN: [id number]  HPI   [name] is here in follow up of his CVA's. He suffered another stroke (left MCA infarct) in November, not too long after I saw him. Since the stroke, the family has noticed a substantial change in his language and cognition. He is intermittently incontinent of bowels and bladder. His appetite is inconsistent and sometimes is a cognitive/behavioral issue.   He is sleeping at night with his seroquel and trazodone. He remains on celexa for mood but family reports that he's depressed and often agitated.  Family states that they try to work on language with him but that he becomes frustrated and belligerent with them.  They are providing 24-hour supervision for him at home.      Pain Inventory Average Pain 0 Pain Right Now 0 My pain is na  In the last 24 hours, has pain interfered with the following? General activity 0 Relation with others 0 Enjoyment of life 0 What TIME of day is your pain at its worst? na Sleep (in general) Fair  Pain is worse with: na Pain improves with: na Relief from Meds: na  Mobility walk with assistance ability to climb steps?  no do you drive?  no use a wheelchair needs help with transfers  Function not employed: date last employed . I need assistance with the following:  feeding, dressing, bathing, toileting, meal prep, household duties and shopping  Neuro/Psych trouble walking  Prior Studies x-rays CT/MRI  Physicians involved in your care Psychiatrist ?   Family History  Problem Relation Age of Onset  . Diabetes Neg Hx   . Stroke Neg Hx   . Cancer Neg Hx    Social History   Socioeconomic History  . Marital status: Widowed    Spouse name: Not on file  . Number of children: Not on file  . Years of education: Not on file  . Highest education level: Not on file  Occupational History  . Not on file  Social Needs  . Financial  resource strain: Not on file  . Food insecurity:    Worry: Not on file    Inability: Not on file  . Transportation needs:    Medical: Not on file    Non-medical: Not on file  Tobacco Use  . Smoking status: Never Smoker  . Smokeless tobacco: Never Used  Substance and Sexual Activity  . Alcohol use: No  . Drug use: No  . Sexual activity: Not on file  Lifestyle  . Physical activity:    Days per week: Not on file    Minutes per session: Not on file  . Stress: Not on file  Relationships  . Social connections:    Talks on phone: Not on file    Gets together: Not on file    Attends religious service: Not on file    Active member of club or organization: Not on file    Attends meetings of clubs or organizations: Not on file    Relationship status: Not on file  Other Topics Concern  . Not on file  Social History Narrative   Lives in apt with 2 daughters who care for him.  He drinks tea in am.     Past Surgical History:  Procedure Laterality Date  . LAPAROSCOPIC APPENDECTOMY  02/21/2012   Procedure: APPENDECTOMY LAPAROSCOPIC;  Surgeon: Cherylynn RidgesJames O Wyatt, MD;  Location: [facility] OR;  Service: General;  Laterality: N/A;   Past Medical History:  Diagnosis Date  . Diabetes (HCC)   . Dizziness   . Hypertension   . Stroke (HCC)    x 2, last Nov 2018   There were no vitals taken for this visit.  Opioid Risk Score:   Fall Risk Score:  `1  Depression screen PHQ 2/9  Depression screen PHQ 2/9 04/16/2017  Decreased Interest 0  Down, Depressed, Hopeless 0  PHQ - 2 Score 0     Review of Systems  Constitutional: Positive for appetite change and unexpected weight change.  HENT: Negative.   Eyes: Negative.   Respiratory: Negative.   Cardiovascular: Negative.   Gastrointestinal: Positive for constipation.  Endocrine: Negative.   Genitourinary: Negative.   Musculoskeletal: Positive for gait problem.  Skin: Negative.   Allergic/Immunologic: Negative.   Hematological: Negative.     Psychiatric/Behavioral: Negative.   All other systems reviewed and are negative.      Objective:   Physical Exam  General: No acute distress HEENT: EOMI, oral membranes moist Cards: reg rate  Chest: normal effort Abdomen: Soft, NT, ND Skin: dry, intact Extremities: no edema Musculoskeletal: He exhibits no edemaor tenderness.  Neurological: He is alert.  Flat affect. with interpreter pt struggles to follow simple commands. Was able to do one or simple movements with numerous cues.  Moves all 4's fairly selectively.  Senses pain equally right to left. has caen in hand. In w/c today.  Skin: Skin is warmand dry.  Psychiatric: flat, withdrawn.     Assessment & Plan:  1. 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  Suffered an additional left MCA infarct in November 2019 with ongoing language and cognitive deficits -cane for balance with supervision.  -family providing much of care. Needs 24 hr supervision           -Expressed the importance of ongoing language activities with the patient, even if he is resistant to doing so. 2. Depression: celexa---increase to 20mg  at bedtime 3. Visual acuity---stable 4. DM: amaryl . -Management per primary 15 minutes of face to face patient care time were spent during this visit. All questions were encouraged and answered. Interpreter present to help with communication with patient and family.    Follow up with me in 6 months.

## 2017-12-23 NOTE — Patient Instructions (Signed)
CONTINUE TO WORK ON WORDS AND CONVERSATIONS. HE MAY BECOME ANGRY OR FRUSTRATION, BUT THIS IS THE ONLY WAY HIS LANGUAGE WILL IMPROVE.    PLEASE FEEL FREE TO CALL OUR OFFICE WITH ANY PROBLEMS OR QUESTIONS [phone])

## 2018-04-28 ENCOUNTER — Telehealth: Payer: Self-pay | Admitting: Physical Medicine & Rehabilitation

## 2018-04-28 NOTE — Telephone Encounter (Signed)
[phone] ext [phone] called request another wheel chair med necess stmmt - she refaxed today - I put in AK box for signature

## 2018-05-19 ENCOUNTER — Emergency Department (HOSPITAL_COMMUNITY): Payer: Medicare Other

## 2018-05-19 ENCOUNTER — Inpatient Hospital Stay (HOSPITAL_COMMUNITY)
Admission: EM | Admit: 2018-05-19 | Discharge: 2018-05-22 | DRG: 071 | Disposition: A | Discharge status: Home Health | Payer: Medicare Other | Attending: Internal Medicine | Admitting: Internal Medicine

## 2018-05-19 ENCOUNTER — Encounter (HOSPITAL_COMMUNITY): Payer: Self-pay | Admitting: Emergency Medicine

## 2018-05-19 DIAGNOSIS — I10 Essential (primary) hypertension: Secondary | ICD-10-CM | POA: Diagnosis present

## 2018-05-19 DIAGNOSIS — R627 Adult failure to thrive: Secondary | ICD-10-CM | POA: Diagnosis present

## 2018-05-19 DIAGNOSIS — Z9049 Acquired absence of other specified parts of digestive tract: Secondary | ICD-10-CM | POA: Diagnosis not present

## 2018-05-19 DIAGNOSIS — Z7984 Long term (current) use of oral hypoglycemic drugs: Secondary | ICD-10-CM | POA: Diagnosis not present

## 2018-05-19 DIAGNOSIS — Z79899 Other long term (current) drug therapy: Secondary | ICD-10-CM | POA: Diagnosis not present

## 2018-05-19 DIAGNOSIS — R4182 Altered mental status, unspecified: Secondary | ICD-10-CM | POA: Diagnosis not present

## 2018-05-19 DIAGNOSIS — R4701 Aphasia: Secondary | ICD-10-CM | POA: Diagnosis not present

## 2018-05-19 DIAGNOSIS — F329 Major depressive disorder, single episode, unspecified: Secondary | ICD-10-CM | POA: Diagnosis present

## 2018-05-19 DIAGNOSIS — I69311 Memory deficit following cerebral infarction: Secondary | ICD-10-CM

## 2018-05-19 DIAGNOSIS — E119 Type 2 diabetes mellitus without complications: Secondary | ICD-10-CM

## 2018-05-19 DIAGNOSIS — I69320 Aphasia following cerebral infarction: Secondary | ICD-10-CM | POA: Diagnosis not present

## 2018-05-19 DIAGNOSIS — Z7982 Long term (current) use of aspirin: Secondary | ICD-10-CM | POA: Diagnosis not present

## 2018-05-19 DIAGNOSIS — F0151 Vascular dementia with behavioral disturbance: Secondary | ICD-10-CM | POA: Diagnosis present

## 2018-05-19 DIAGNOSIS — R1310 Dysphagia, unspecified: Secondary | ICD-10-CM | POA: Diagnosis present

## 2018-05-19 DIAGNOSIS — I6300 Cerebral infarction due to thrombosis of unspecified precerebral artery: Secondary | ICD-10-CM

## 2018-05-19 DIAGNOSIS — E1149 Type 2 diabetes mellitus with other diabetic neurological complication: Secondary | ICD-10-CM

## 2018-05-19 DIAGNOSIS — G9341 Metabolic encephalopathy: Principal | ICD-10-CM | POA: Diagnosis present

## 2018-05-19 DIAGNOSIS — E1151 Type 2 diabetes mellitus with diabetic peripheral angiopathy without gangrene: Secondary | ICD-10-CM | POA: Diagnosis present

## 2018-05-19 DIAGNOSIS — R630 Anorexia: Secondary | ICD-10-CM | POA: Diagnosis present

## 2018-05-19 DIAGNOSIS — Z7902 Long term (current) use of antithrombotics/antiplatelets: Secondary | ICD-10-CM

## 2018-05-19 DIAGNOSIS — R451 Restlessness and agitation: Secondary | ICD-10-CM

## 2018-05-19 DIAGNOSIS — I639 Cerebral infarction, unspecified: Secondary | ICD-10-CM | POA: Diagnosis present

## 2018-05-19 DIAGNOSIS — F05 Delirium due to known physiological condition: Secondary | ICD-10-CM | POA: Diagnosis present

## 2018-05-19 LAB — CBC WITH DIFFERENTIAL/PLATELET
Abs Immature Granulocytes: 0.1 10*3/uL (ref 0.0–0.1)
BASOS ABS: 0 10*3/uL (ref 0.0–0.1)
Basophils Relative: 0 %
EOS ABS: 0.2 10*3/uL (ref 0.0–0.7)
EOS PCT: 2 %
HCT: 35.6 % — ABNORMAL LOW (ref 39.0–52.0)
HEMOGLOBIN: 11.9 g/dL — AB (ref 13.0–17.0)
Immature Granulocytes: 1 %
LYMPHS PCT: 13 %
Lymphs Abs: 1.3 10*3/uL (ref 0.7–4.0)
MCH: 31.2 pg (ref 26.0–34.0)
MCHC: 33.4 g/dL (ref 30.0–36.0)
MCV: 93.4 fL (ref 78.0–100.0)
Monocytes Absolute: 1.2 10*3/uL — ABNORMAL HIGH (ref 0.1–1.0)
Monocytes Relative: 12 %
Neutro Abs: 7.5 10*3/uL (ref 1.7–7.7)
Neutrophils Relative %: 72 %
Platelets: 144 10*3/uL — ABNORMAL LOW (ref 150–400)
RBC: 3.81 MIL/uL — ABNORMAL LOW (ref 4.22–5.81)
RDW: 13.5 % (ref 11.5–15.5)
WBC: 10.3 10*3/uL (ref 4.0–10.5)

## 2018-05-19 LAB — COMPREHENSIVE METABOLIC PANEL
ALBUMIN: 4 g/dL (ref 3.5–5.0)
ALK PHOS: 63 U/L (ref 38–126)
ALT: 18 U/L (ref 0–44)
AST: 29 U/L (ref 15–41)
Anion gap: 14 (ref 5–15)
BILIRUBIN TOTAL: 1.4 mg/dL — AB (ref 0.3–1.2)
BUN: 13 mg/dL (ref 8–23)
CO2: 25 mmol/L (ref 22–32)
CREATININE: 1.16 mg/dL (ref 0.61–1.24)
Calcium: 9.6 mg/dL (ref 8.9–10.3)
Chloride: 100 mmol/L (ref 98–111)
GFR calc Af Amer: >60 mL/min (ref >60)
GFR, EST NON AFRICAN AMERICAN: 59 mL/min — AB (ref >60)
GLUCOSE: 133 mg/dL — AB (ref 70–99)
POTASSIUM: 4 mmol/L (ref 3.5–5.1)
Sodium: 139 mmol/L (ref 135–145)
TOTAL PROTEIN: 7.1 g/dL (ref 6.5–8.1)

## 2018-05-19 LAB — URINALYSIS, ROUTINE W REFLEX MICROSCOPIC
BILIRUBIN URINE: NEGATIVE
Glucose, UA: NEGATIVE mg/dL
Hgb urine dipstick: NEGATIVE
Ketones, ur: NEGATIVE mg/dL
Leukocytes, UA: NEGATIVE
Nitrite: NEGATIVE
Protein, ur: NEGATIVE mg/dL
SPECIFIC GRAVITY, URINE: 1.014 (ref 1.005–1.030)
pH: 5 (ref 5.0–8.0)

## 2018-05-19 LAB — I-STAT CG4 LACTIC ACID, ED
LACTIC ACID, VENOUS: 3.14 mmol/L — AB (ref 0.5–1.9)
Lactic Acid, Venous: 1.37 mmol/L (ref 0.5–1.9)

## 2018-05-19 LAB — PROTIME-INR
INR: 1.02
Prothrombin Time: 13.3 seconds (ref 11.4–15.2)

## 2018-05-19 LAB — AMMONIA: Ammonia: 23 umol/L (ref 9–35)

## 2018-05-19 MED ORDER — ENOXAPARIN SODIUM 40 MG/0.4ML ~~LOC~~ SOLN
40.0000 mg | SUBCUTANEOUS | Status: DC
  Administered 2018-05-19 – 2018-05-20 (×2): 40 mg via SUBCUTANEOUS
  Filled 2018-05-19 (×3): qty 0.4

## 2018-05-19 MED ORDER — STROKE: EARLY STAGES OF RECOVERY BOOK
Freq: Once | Status: AC
  Administered 2018-05-19: 22:00:00

## 2018-05-19 MED ORDER — ALBUTEROL SULFATE (2.5 MG/3ML) 0.083% IN NEBU: 3.0000 mL | INHALATION_SOLUTION | Freq: Four times a day (QID) | RESPIRATORY_TRACT | Status: DC | PRN

## 2018-05-19 MED ORDER — ASPIRIN 300 MG RE SUPP: 300.0000 mg | Freq: Every day | RECTAL | Status: DC

## 2018-05-19 MED ORDER — ACETAMINOPHEN 325 MG PO TABS: 650.0000 mg | ORAL_TABLET | ORAL | Status: DC | PRN

## 2018-05-19 MED ORDER — THIAMINE HCL 100 MG/ML IJ SOLN
500.0000 mg | Freq: Every day | INTRAVENOUS | Status: AC
  Administered 2018-05-19 – 2018-05-21 (×3): 500 mg via INTRAVENOUS
  Filled 2018-05-19 (×3): qty 5

## 2018-05-19 MED ORDER — SENNOSIDES-DOCUSATE SODIUM 8.6-50 MG PO TABS: 1.0000 | ORAL_TABLET | Freq: Every evening | ORAL | Status: DC | PRN

## 2018-05-19 MED ORDER — LORAZEPAM 2 MG/ML IJ SOLN
0.5000 mg | Freq: Once | INTRAMUSCULAR | Status: AC
  Administered 2018-05-19: 0.5 mg via INTRAVENOUS
  Filled 2018-05-19: qty 1

## 2018-05-19 MED ORDER — LORAZEPAM 1 MG PO TABS
1.0000 mg | ORAL_TABLET | Freq: Once | ORAL | Status: DC
  Filled 2018-05-19: qty 1

## 2018-05-19 MED ORDER — ASPIRIN 325 MG PO TABS
325.0000 mg | ORAL_TABLET | Freq: Every day | ORAL | Status: DC
  Administered 2018-05-20 – 2018-05-21 (×2): 325 mg via ORAL
  Filled 2018-05-19 (×3): qty 1

## 2018-05-19 MED ORDER — LORAZEPAM 0.5 MG PO TABS: 0.5000 mg | ORAL_TABLET | Freq: Three times a day (TID) | ORAL | Status: DC | PRN

## 2018-05-19 MED ORDER — LORATADINE 10 MG PO TABS
10.0000 mg | ORAL_TABLET | Freq: Every day | ORAL | Status: DC
  Administered 2018-05-20 – 2018-05-21 (×2): 10 mg via ORAL
  Filled 2018-05-19 (×3): qty 1

## 2018-05-19 MED ORDER — FOLIC ACID 1 MG PO TABS
1.0000 mg | ORAL_TABLET | Freq: Every day | ORAL | Status: DC
  Administered 2018-05-20 – 2018-05-21 (×2): 1 mg via ORAL
  Filled 2018-05-19 (×3): qty 1

## 2018-05-19 MED ORDER — SERTRALINE HCL 100 MG PO TABS
100.0000 mg | ORAL_TABLET | Freq: Every day | ORAL | Status: DC
  Administered 2018-05-20 – 2018-05-21 (×2): 100 mg via ORAL
  Filled 2018-05-19 (×3): qty 1

## 2018-05-19 MED ORDER — ACETAMINOPHEN 160 MG/5ML PO SOLN: 650.0000 mg | ORAL | Status: DC | PRN

## 2018-05-19 MED ORDER — QUETIAPINE FUMARATE 50 MG PO TABS: 50.0000 mg | ORAL_TABLET | Freq: Every day | ORAL | Status: DC

## 2018-05-19 MED ORDER — LORAZEPAM 2 MG/ML IJ SOLN
1.0000 mg | Freq: Once | INTRAMUSCULAR | Status: AC
  Administered 2018-05-19: 1 mg via INTRAVENOUS
  Filled 2018-05-19: qty 1

## 2018-05-19 MED ORDER — CLOPIDOGREL BISULFATE 75 MG PO TABS
75.0000 mg | ORAL_TABLET | Freq: Every day | ORAL | Status: DC
  Administered 2018-05-20 – 2018-05-21 (×2): 75 mg via ORAL
  Filled 2018-05-19 (×3): qty 1

## 2018-05-19 MED ORDER — ACETAMINOPHEN 650 MG RE SUPP: 650.0000 mg | RECTAL | Status: DC | PRN

## 2018-05-19 MED ORDER — TRAZODONE HCL 100 MG PO TABS
100.0000 mg | ORAL_TABLET | Freq: Every day | ORAL | Status: DC
  Administered 2018-05-20: 100 mg via ORAL
  Filled 2018-05-19 (×2): qty 1

## 2018-05-19 MED ORDER — ATORVASTATIN CALCIUM 80 MG PO TABS
80.0000 mg | ORAL_TABLET | Freq: Every day | ORAL | Status: DC
  Administered 2018-05-20 – 2018-05-21 (×2): 80 mg via ORAL
  Filled 2018-05-19 (×3): qty 1

## 2018-05-19 MED ORDER — SODIUM CHLORIDE 0.9 % IV BOLUS
1000.0000 mL | Freq: Once | INTRAVENOUS | Status: AC
  Administered 2018-05-19: 1000 mL via INTRAVENOUS

## 2018-05-19 MED ORDER — SODIUM CHLORIDE 0.9 % IV SOLN
INTRAVENOUS | Status: DC
  Administered 2018-05-19: 1000 mL via INTRAVENOUS

## 2018-05-19 NOTE — ED Notes (Signed)
Patient transported to CT 

## 2018-05-19 NOTE — Consult Note (Addendum)
Neurology Consultation  Reason for Consult: Increased confusion Referring Physician: TEGELER    History is obtained from: Family who is at bedside however there still is some language barrier  HPI: [name] is a 77 y.o. male with history of diabetes hypertension and stroke with his last stroke being in November 2018.  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  Since Thursday they have noted that patient has become very agitated and confused.  They have tried to make him eat however he will just take the food and throat on the floor.  There is also times where he will become incontinent and just urinated on the floor.  It seems as though that he has become more a phasic than usual.  This is the reason that patient was brought to the hospital.  No changes in the medications has been noted.   LKW: 01/13/2018 with unknown exact time tpa given?: no, very much out of the window Premorbid modified Rankin scale (mRS): 3    ZOX:WRUEAV to obtain due to altered mental status.   Past Medical History:  Diagnosis Date  . Diabetes (HCC)   . Dizziness   . Hypertension   . Stroke [facility])    x 2, last Nov 2018    Family History  Problem Relation Age of Onset  . Diabetes Neg Hx   . Stroke Neg Hx   . Cancer Neg Hx      Social History:   reports that he has never smoked. He has never used smokeless tobacco. He reports that he does not drink alcohol or use drugs.  Medications No current facility-administered medications for this encounter.   Current Outpatient Medications:  .  albuterol (PROVENTIL HFA;VENTOLIN HFA) 108 (90 Base) MCG/ACT inhaler, Inhale 2 puffs into the lungs every 6 (six) hours as needed for wheezing or shortness of breath., Disp: , Rfl:  .  aspirin EC 325 MG EC tablet, Take 1 tablet (325 mg total) daily by mouth., Disp: 30 tablet, Rfl: 0 .   atorvastatin (LIPITOR) 80 MG tablet, Take 1 tablet (80 mg total) by mouth daily at 6 PM., Disp: 30 tablet, Rfl: 0 .  Cholecalciferol (VITAMIN D3) 50000 units CAPS, Take 1 capsule by mouth once a week., Disp: , Rfl:  .  clopidogrel (PLAVIX) 75 MG tablet, Take 1 tablet (75 mg total) by mouth daily., Disp: 30 tablet, Rfl: 0 .  folic acid (FOLVITE) 1 MG tablet, Take 1 mg by mouth daily., Disp: , Rfl: 2 .  LINZESS 145 MCG CAPS capsule, Take 145 mcg by mouth daily before breakfast. , Disp: , Rfl:  .  loratadine (CLARITIN) 10 MG tablet, Take 10 mg by mouth daily. , Disp: , Rfl:  .  losartan-hydrochlorothiazide (HYZAAR) 50-12.5 MG tablet, Take 1 tablet by mouth daily. , Disp: , Rfl:  .  meclizine (ANTIVERT) 12.5 MG tablet, Take 12.5 mg by mouth 3 (three) times daily as needed for dizziness., Disp: , Rfl:  .  metFORMIN (GLUCOPHAGE) 500 MG tablet, Take 500 mg by mouth 2 (two) times daily with a meal., Disp: , Rfl:  .  QUEtiapine (SEROQUEL) 25 MG tablet, Take 1 tablet (25 mg total) at bedtime by mouth. (Patient taking differently: Take 50 mg by mouth at bedtime. 1900), Disp: 30 tablet, Rfl: 0 .  sertraline (ZOLOFT) 100 MG tablet, Take 100 mg by mouth daily., Disp: , Rfl:  2 .  traZODone (DESYREL) 100 MG tablet, Take 100 mg by mouth at bedtime. , Disp: , Rfl:  .  citalopram (CELEXA) 20 MG tablet, Take 1 tablet (20 mg total) by mouth at bedtime. (Patient not taking: Reported on 05/19/2018), Disp: 30 tablet, Rfl: 6 .  glucose blood test strip, Use as instructed, Disp: 100 each, Rfl: 12 .  Lancets (ONETOUCH ULTRASOFT) lancets, Check blood sugars twice a day, Disp: 60 each, Rfl: 5   Exam: Current vital signs: BP 108/67   Pulse 60   Temp 97.6 F (36.4 C) (Axillary)   Resp 20   SpO2 99%  Vital signs in last 24 hours: Temp:  [97.6 F (36.4 C)] 97.6 F (36.4 C) (08/26 0840) Pulse Rate:  [58-72] 60 (08/26 1600) Resp:  [18-25] 20 (08/26 1600) BP: (106-134)/(66-94) 108/67 (08/26 1600) SpO2:  [97 %-100 %] 99 %  (08/26 1600)  GENERAL: Awake, alert in NAD HEENT: - Normocephalic and atraumatic, Ext: warm, well perfused, intact peripheral pulses, no edema  NEURO:  Mental Status: Patient is alert at current time there is a significant language barrier thus he does not follow commands. Cranial Nerves: PERRL 2 mm/brisk. EOMI, blinks to threat bilaterally, no facial asymmetry, facial sensation intact, hearing intact, at this point secondary to leakage.  I was unable to finish the cranial nerve exam as he would not follow commands  motor: Moving all extremities 5 out of 5 Tone: Show some cogwheeling of the upper extremities however did not notice any cogwheeling of the wrists. Sensation- Intact to noxious stimuli Coordination: Unable to get to follow commands Gait-unable to examine     Labs I have reviewed labs in epic and the results pertinent to this consultation are:   CBC    Component Value Date/Time   WBC 10.3 05/19/2018 0854   RBC 3.81 (L) 05/19/2018 0854   HGB 11.9 (L) 05/19/2018 0854   HCT 35.6 (L) 05/19/2018 0854   PLT 144 (L) 05/19/2018 0854   MCV 93.4 05/19/2018 0854   MCH 31.2 05/19/2018 0854   MCHC 33.4 05/19/2018 0854   RDW 13.5 05/19/2018 0854   LYMPHSABS 1.3 05/19/2018 0854   MONOABS 1.2 (H) 05/19/2018 0854   EOSABS 0.2 05/19/2018 0854   BASOSABS 0.0 05/19/2018 0854    CMP     Component Value Date/Time   NA 139 05/19/2018 0854   K 4.0 05/19/2018 0854   CL 100 05/19/2018 0854   CO2 25 05/19/2018 0854   GLUCOSE 133 (H) 05/19/2018 0854   BUN 13 05/19/2018 0854   CREATININE 1.16 05/19/2018 0854   CALCIUM 9.6 05/19/2018 0854   PROT 7.1 05/19/2018 0854   ALBUMIN 4.0 05/19/2018 0854   AST 29 05/19/2018 0854   ALT 18 05/19/2018 0854   ALKPHOS 63 05/19/2018 0854   BILITOT 1.4 (H) 05/19/2018 0854   GFRNONAA 59 (L) 05/19/2018 0854   GFRAA >60 05/19/2018 0854    Lipid Panel     Component Value Date/Time   CHOL 118 08/03/2017 0457   TRIG 70 08/03/2017 0457   HDL  32 (L) 08/03/2017 0457   CHOLHDL 3.7 08/03/2017 0457   VLDL 14 08/03/2017 0457   LDLCALC 72 08/03/2017 0457   Ammonia within normal limits Urinalysis within normal limits CBC does not show any elevated leukocytosis  Imaging I have reviewed the images obtained:  CT-scan of the brain-shows old left MCA territory infarct with atrophy and encephalomalacia.  Advanced chronic small vessel ischemic changes throughout the hemisphere.  MRI  examination of the brain--tempting at this time  Assessment: New onset increased confusion along with behavioral issues.  No significant localizing or lateralizing abnormalities.  Given his multiple strokes in the past I believe that MRI brain would be very beneficial.     Recommendations: #Agree with MRI brain    Attending addendum Patient seen and examined. Agree with the history and physical documented above. 77 year old with last stroke in November 2018, large left MCA, since then he has residual expressive and receptive aphasia with some mild right-sided weakness. Brought into the hospital because of increasing altered mental status, inability to follow simple commands that he was in the past. He has at times been incontinent and urinated on the floor and also had bowel movements on the floor.  No witnessed seizure activity. No recent changes in the medication Has been having difficult time with sleeping. No preceding illnesses.  No cough, fevers, chills, diarrhea. Been exhibiting violent behavior towards family members.  Has a small child at home-grandson, to whom he has been somewhat hostile lately.  Lives with daughter and son-in-law and their 76-year-old child.  Review of systems as above  On my examination Elderly man, laying restlessly in bed No obvious seizure activity Regular rate rhythm Chest clear to auscultation Neurological exam Next line does not follow any commands. Upon attempting to examine his eyes, closes his eyes  forcefully. He has paratonia in all 4 extremities. He is able to move all 4 extremities, unable to reliably test but are at least antigravity. Withdraws to noxious edematous in all fours Did not follow commands to check gait or coordination  Labs reviewed Urinalysis unremarkable Ammonia within normal level CBC within normal levels Chest x-ray reported as no active cardia pulmonary disease TSH from November 2018 6.37, B12 from November 2018 was 404.  I have independently reviewed imaging.  MRI of the brain-truncated with no new stroke.  Assessment 77 year old man with past medical history of a large left MCA stroke, with residual expressive and receptive aphasia, brought into the hospital for increasing altered mental status and agitation, noticed more so since Thursday of last week. MRI imaging-although truncated exam, negative for any acute stroke. No history of preceding illnesses sicknesses. Labs and chest x-ray unrevealing till now. Patient has had poor p.o. Intake.  Differentials at this time include: Wernicke's given poor p.o. intake, worsening of underlying dementia, dementia with behavioral disturbances, evaluate for underlying seizures and possible polypharmacy.  Recommendations: -EEG in the morning to rule out underlying seizure activity - Continue aspirin and Plavix for stroke prevention -Patient is currently getting Seroquel 50 mg at bedtime, Zoloft 100 daily, trazodone 100 at bedtime, Celexa 20 mg has been prescribed but not taking\.  I would recommend optimizing his sedating medications at this time.  I would recommend continuing Seroquel and using Ativan in low doses as needed. - I do think that he would benefit from an outpatient psychiatry evaluation but an inpatient work-up for reversible causes should first be pursued prior to getting a psychiatric consultation. -Check TSH, B12, RPR and HIV. - High-dose thiamine given poor p.o. intake  Neurology service will follow  with you  -- Milon DikesAshish Brinklee Cisse, MD [facility] Pager: [phone] If 7pm to 7am, please call on call as listed on AMION.
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
## 2018-05-19 NOTE — ED Notes (Signed)
Patient crawling out of bed and not following directions from staff or daughter. MD notified.

## 2018-05-19 NOTE — ED Notes (Signed)
Patient transported to X-ray 

## 2018-05-19 NOTE — ED Notes (Signed)
Patient transported to MRI 

## 2018-05-19 NOTE — ED Notes (Signed)
2nd attempt at report 

## 2018-05-19 NOTE — ED Notes (Signed)
Family at bedside. MD notified.

## 2018-05-19 NOTE — H&P (Signed)
History and Physical    [name] ZOX:[id number] DOB: October 13, 1940 DOA: 05/19/2018  Referring MD/NP/PA: EDP PCP:  Patient coming from: home  Chief Complaint: Not acting right  HPI: [name] is a 77 y.o. 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 He would also urinate and defecate on himself. Daughter denies any changes in medications, no fevers or chills.  ED Course: Labs and vital signs are unremarkable in the emergency room he was confused and agitated, urinalysis was unremarkable as well, CT brain showed atrophy and chronic small vessel disease and sequelae of prior strokes  Review of Systems: As per HPI otherwise 14 point review of systems negative.   Past Medical History:  Diagnosis Date  . Diabetes (HCC)   . Dizziness   . Hypertension   . Stroke [facility])    x 2, last Nov 2018    Past Surgical History:  Procedure Laterality Date  . LAPAROSCOPIC APPENDECTOMY  02/21/2012   Procedure: APPENDECTOMY LAPAROSCOPIC;  Surgeon: Cherylynn Ridges, MD;  Location: [facility] OR;  Service: General;  Laterality: N/A;     reports that he has never smoked. He has never used smokeless tobacco. He reports that he does not drink alcohol or use drugs.  No Known Allergies  Family History  Problem Relation Age of Onset  . Diabetes Neg Hx   . Stroke Neg Hx   . Cancer Neg Hx      Prior to Admission  medications   Medication Sig Start Date End Date Taking? Authorizing Provider  albuterol (PROVENTIL HFA;VENTOLIN HFA) 108 (90 Base) MCG/ACT inhaler Inhale 2 puffs into the lungs every 6 (six) hours as needed for wheezing or shortness of breath.   Yes [provider]  aspirin EC 325 MG EC tablet Take 1 tablet (325 mg total) daily by mouth. 08/05/17  Yes Rolly Salter, MD  atorvastatin (LIPITOR) 80 MG tablet Take 1 tablet (80 mg total) by mouth daily at 6 PM. 04/11/17  Yes Love, Evlyn Kanner, PA-C  Cholecalciferol (VITAMIN D3) 50000 units CAPS Take 1 capsule by mouth once a week.   Yes [provider]  clopidogrel (PLAVIX) 75 MG tablet Take 1 tablet (75 mg total) by mouth daily. 04/11/17  Yes Love, Evlyn Kanner, PA-C  folic acid (FOLVITE) 1 MG tablet Take 1 mg by mouth daily. 03/31/18  Yes [provider]  LINZESS 145 MCG CAPS capsule Take 145 mcg by mouth daily before breakfast.  08/25/17  Yes [provider]  loratadine (CLARITIN) 10 MG tablet Take 10 mg by mouth daily.  12/10/11  Yes [provider]  losartan-hydrochlorothiazide (HYZAAR) 50-12.5 MG tablet Take 1 tablet by mouth daily.  08/25/17  Yes [provider]  meclizine (ANTIVERT) 12.5 MG tablet Take 12.5 mg by mouth 3 (three) times daily as needed for dizziness.   Yes [provider]  metFORMIN (GLUCOPHAGE) 500 MG tablet Take 500 mg by mouth 2 (two) times  daily with a meal.   Yes [provider]  QUEtiapine (SEROQUEL) 25 MG tablet Take 1 tablet (25 mg total) at bedtime by mouth. Patient taking differently: Take 50 mg by mouth at bedtime. 1900 08/04/17  Yes Rolly Salter, MD  sertraline (ZOLOFT) 100 MG tablet Take 100 mg by mouth daily. 02/28/18  Yes [provider]  traZODone (DESYREL) 100 MG tablet Take 100 mg by mouth at bedtime.    Yes [provider]  citalopram (CELEXA) 20 MG tablet Take 1 tablet (20 mg total) by mouth at bedtime. Patient not taking: Reported  on 05/19/2018 12/23/17   Faith Rogue T, MD  glucose blood test strip Use as instructed 04/16/17   Ranelle Oyster, MD  Lancets [facility] ULTRASOFT) lancets Check blood sugars twice a day 04/16/17   Ranelle Oyster, MD    Physical Exam: Vitals:   05/19/18 1430 05/19/18 1500 05/19/18 1530 05/19/18 1600  BP: 116/74 116/68 106/70 108/67  Pulse: 66 63  60  Resp: 20 20  20   Temp:      TempSrc:      SpO2: 100% 99%  99%      Constitutional: cachectic thinly built Nepali male, laying on a stretcher Vitals:   05/19/18 1430 05/19/18 1500 05/19/18 1530 05/19/18 1600  BP: 116/74 116/68 106/70 108/67  Pulse: 66 63  60  Resp: 20 20  20   Temp:      TempSrc:      SpO2: 100% 99%  99%   ENMT: Mucous membranes are dry.  Neck: normal, supple Respiratory: poor air movement, clear bilaterally Cardiovascular: Regular rate and rhythm, no murmurs / rubs / gallops Abdomen: soft, non tender, Bowel sounds positive.  Ext: no edema Skin: no rashes, lesions, ulcers.  Neurologic: unable to follow commands, neuro exam was significantly limited, cranial nerves appear intact, tone increased in both lower extremities, unable to assess sensations, plantars were mute Psychiatric: unable to assess  Labs on Admission: I have personally reviewed following labs and imaging studies  CBC: Recent Labs  Lab 05/19/18 0854  WBC 10.3  NEUTROABS 7.5  HGB 11.9*  HCT 35.6*  MCV 93.4  PLT 144*   Basic Metabolic Panel: Recent Labs  Lab 05/19/18 0854  NA 139  K 4.0  CL 100  CO2 25  GLUCOSE 133*  BUN 13  CREATININE 1.16  CALCIUM 9.6   GFR: CrCl cannot be calculated (Unknown ideal weight.). Liver Function Tests: Recent Labs  Lab 05/19/18 0854  AST 29  ALT 18  ALKPHOS 63  BILITOT 1.4*  PROT 7.1  ALBUMIN 4.0   No results for input(s): LIPASE, AMYLASE in the last 168 hours. Recent Labs  Lab 05/19/18 0905  AMMONIA 23   Coagulation Profile: Recent Labs  Lab 05/19/18 0854  INR 1.02    Cardiac Enzymes: No results for input(s): CKTOTAL, CKMB, CKMBINDEX, TROPONINI in the last 168 hours. BNP (last 3 results) No results for input(s): PROBNP in the last 8760 hours. HbA1C: No results for input(s): HGBA1C in the last 72 hours. CBG: No results for input(s): GLUCAP in the last 168 hours. Lipid Profile: No results for input(s): CHOL, HDL, LDLCALC, TRIG, CHOLHDL, LDLDIRECT in the last 72 hours. Thyroid Function Tests: No results for input(s): TSH, T4TOTAL, FREET4, T3FREE, THYROIDAB in the last 72 hours. Anemia Panel: No results for input(s): VITAMINB12, FOLATE, FERRITIN, TIBC, IRON, RETICCTPCT in the last 72 hours. Urine analysis:    Component Value Date/Time   COLORURINE YELLOW 05/19/2018 0854  APPEARANCEUR CLEAR 05/19/2018 0854   LABSPEC 1.014 05/19/2018 0854   PHURINE 5.0 05/19/2018 0854   GLUCOSEU NEGATIVE 05/19/2018 0854   HGBUR NEGATIVE 05/19/2018 0854   HGBUR negative 08/28/2010 0959   BILIRUBINUR NEGATIVE 05/19/2018 0854   KETONESUR NEGATIVE 05/19/2018 0854   PROTEINUR NEGATIVE 05/19/2018 0854   UROBILINOGEN 1.0 02/20/2012 1944   NITRITE NEGATIVE 05/19/2018 0854   LEUKOCYTESUR NEGATIVE 05/19/2018 0854   Sepsis Labs: @LABRCNTIP (procalcitonin:4,lacticidven:4) )No results found for this or any previous visit (from the past 240 hour(s)).   Radiological Exams on Admission: Dg Chest 2 View  Result Date: 05/19/2018 CLINICAL DATA:  Altered mental status EXAM: CHEST - 2 VIEW COMPARISON:  08/01/2017 FINDINGS: The heart size and mediastinal contours are within normal limits. Both lungs are clear. The visualized skeletal structures are unremarkable. IMPRESSION: No active cardiopulmonary disease. Electronically Signed   By: Alcide CleverMark  Lukens M.D.   On: 05/19/2018 09:51   Ct Head Wo Contrast  Result Date: 05/19/2018 CLINICAL DATA:  Previous stroke.  Mental status changes. EXAM: CT HEAD WITHOUT CONTRAST TECHNIQUE: Contiguous axial images were obtained from the base of the  skull through the vertex without intravenous contrast. COMPARISON:  08/03/2017 FINDINGS: Brain: Generalized atrophy. Extensive chronic small-vessel ischemic changes throughout the cerebral hemispheric white matter. Old left MCA territory stroke affecting the temporal lobe, insula and frontoparietal brain. This has progressed to atrophy and encephalomalacia. No CT evidence of acute infarction, mass lesion, hemorrhage, hydrocephalus or extra-axial collection. Vascular: There is atherosclerotic calcification of the major vessels at the base of the brain. Skull: Negative Sinuses/Orbits: Clear/normal Other: None IMPRESSION: Old left MCA territory infarction with atrophy and encephalomalacia. Advanced chronic small-vessel ischemic change throughout the hemispheric white matter. No sign of acute or subacute lesion. These results were communicated to Dr. Laurence SlateAroor At 10:13 amon 8/26/2019by text page via the [facility] LPMION messaging system. Electronically Signed   By: Paulina FusiMark  Shogry M.D.   On: 05/19/2018 10:14    EKG:I have independently reviewed his EKG, and this shows normal sinus rhythm, right axis deviation, no acute ST-T wave changes  Assessment/Plan Principal Problem:  Acute onset worsening expressive aphasia/confusion -Unclear if this could be from another stroke versus worsening behavioral problems as a result of vascular dementia -Labs and vital signs do not indicate overt infectious or metabolic etiology for symptoms -Urinalysis is unremarkable -Attempt MRI brain with Ativan for sedation -Will need stroke workup if MRI is positive -Continue home regimen of bedtime Seroquel and trazodone daily at bedtime -Advised family to try to be with the patient during hospitalization due to unfamiliarity with place, language and situation -PT/OT/SLP evaluation  Hypertension -BP is stable, hold ARB/diuretic at this time  Diabetes mellitus -Hold metformin, sliding scale insulin -Follow-up hemoglobin A1c  History of left  MCA stroke in 07/2017 and right MCA stroke in 03/2017 -Continue aspirin, Plavix and statin    Confusion/cognitive decline -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

## 2018-05-19 NOTE — ED Triage Notes (Signed)
Per [facility] pt coming from home where he lives with daughter, states pt has hx of stroke in 2018 and has been "different" ever since. Pt was combative with EMS on scene and given 5mg  versed and 2.5 mg haldol IM prior to transport. Family had difficulty communicating with staff on scene due to language. Pt on Plavix and depression meds. States pt has malodorous urine.

## 2018-05-19 NOTE — ED Notes (Signed)
In and out patient for urine sample patient is now resting with call bell in reach

## 2018-05-19 NOTE — ED Notes (Signed)
Neurologist at bedside. 

## 2018-05-20 DIAGNOSIS — I10 Essential (primary) hypertension: Secondary | ICD-10-CM | POA: Diagnosis not present

## 2018-05-20 DIAGNOSIS — R4182 Altered mental status, unspecified: Secondary | ICD-10-CM | POA: Diagnosis not present

## 2018-05-20 DIAGNOSIS — G4700 Insomnia, unspecified: Secondary | ICD-10-CM | POA: Diagnosis not present

## 2018-05-20 DIAGNOSIS — E1149 Type 2 diabetes mellitus with other diabetic neurological complication: Secondary | ICD-10-CM | POA: Diagnosis not present

## 2018-05-20 LAB — LIPID PANEL
Cholesterol: 102 mg/dL (ref 0–200)
HDL: 46 mg/dL (ref >40)
LDL CALC: 42 mg/dL (ref 0–99)
TRIGLYCERIDES: 68 mg/dL (ref <150)
Total CHOL/HDL Ratio: 2.2 RATIO
VLDL: 14 mg/dL (ref 0–40)

## 2018-05-20 LAB — GLUCOSE, CAPILLARY
GLUCOSE-CAPILLARY: 84 mg/dL (ref 70–99)
GLUCOSE-CAPILLARY: 105 mg/dL — AB (ref 70–99)
Glucose-Capillary: 91 mg/dL (ref 70–99)

## 2018-05-20 LAB — URINE CULTURE: Culture: NO GROWTH

## 2018-05-20 LAB — VITAMIN B12: VITAMIN B 12: 354 pg/mL (ref 180–914)

## 2018-05-20 LAB — HEMOGLOBIN A1C
HEMOGLOBIN A1C: 5.3 % (ref 4.8–5.6)
Mean Plasma Glucose: 105.41 mg/dL

## 2018-05-20 LAB — TSH: TSH: 6.624 u[IU]/mL — AB (ref 0.350–4.500)

## 2018-05-20 MED ORDER — OLANZAPINE 5 MG PO TBDP
2.5000 mg | ORAL_TABLET | Freq: Every day | ORAL | Status: DC | PRN
  Administered 2018-05-20: 2.5 mg via ORAL
  Filled 2018-05-20 (×3): qty 0.5

## 2018-05-20 MED ORDER — OLANZAPINE 5 MG PO TBDP
2.5000 mg | ORAL_TABLET | Freq: Every day | ORAL | Status: DC
  Administered 2018-05-20: 2.5 mg via ORAL
  Filled 2018-05-20 (×2): qty 0.5

## 2018-05-20 MED ORDER — INSULIN ASPART 100 UNIT/ML ~~LOC~~ SOLN: 0.0000 [IU] | Freq: Three times a day (TID) | SUBCUTANEOUS | Status: DC

## 2018-05-20 MED ORDER — VITAMIN B-1 100 MG PO TABS
100.0000 mg | ORAL_TABLET | Freq: Every day | ORAL | Status: DC
  Filled 2018-05-20: qty 1

## 2018-05-20 NOTE — Progress Notes (Addendum)
PROGRESS NOTE        PATIENT DETAILS Name: [name] Age: 77 y.o. Sex: male Date of Birth: 1941/08/05 Admit Date: 05/19/2018 Admitting Physician Zannie Cove, MD WUJ:[id number], Dorma Russell, MD  Brief Narrative: Patient is a 77 y.o. 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  Subsequently admitted to the hospitalist service for further evaluation and treatment.  Subjective: Sitting up in chair at bedside-this MD speaks the patient's language-it is very hard to discern what he says.  He seems to speak very softly-and mostly mumbles-Per daughter-his speech has slowly declined over the past few months.  Per daughter-since this past Thursday-patient has been uncooperative, has been urinating and defecating everywhere, has stopped eating, and at times has developed aggressive behavior.  No history of nausea, vomiting, diarrhea, fever.  (Spoke with patient's daughter over the phone)  Assessment/Plan: Worsening mental status: Suspect delirium in the setting of underlying dementia (probably multiinfarct).  MRI of the brain did not show any acute CVA.  No recent change in medications.  No evidence of infection.  Await EEG, HIV, RPR, vitamin B12 and TSH.  Remains on high-dose thiamine given poor oral intake, he does have a remote history of alcohol use as well.  Continue with trazodone, Seroquel-appreciate neurology input, will go ahead and consult psychiatry to see if we can optimize this patient's medications.  Spoke at length with patient's daughter-she does want to take him back home with maximal home health services.  She already has an aide that comes out for 6 hours daily, and while she is working her sister  takes care of this patient.  Will await further input from rehab services as well.  Hypertension: Blood pressure relatively well controlled-currently off all antihypertensives.  DM-2: Start SSI-continue to hold metformin.  History of multiple CVA's: Clinical features more consistent with delirium-MRI brain negative for acute CVA.  Continue dual antiplatelet agents, statin.  DVT Prophylaxis: Prophylactic Lovenox   Code Status: Full code  Family Communication: Daughter over the phone  Disposition Plan: Remain inpatient-but will plan on Home health once mental status is somewhat better and close to usual baseline  Antimicrobial agents: Anti-infectives (From admission, onward)   None      Procedures: None  CONSULTS:  neurology and psychiatry  Time spent: 25  minutes-Greater than 50% of this time was spent in counseling, explanation of diagnosis, planning of further management, and coordination of care.  MEDICATIONS: Scheduled Meds: . aspirin  300 mg Rectal Daily   Or  . aspirin  325 mg Oral Daily  . atorvastatin  80 mg Oral q1800  . clopidogrel  75 mg Oral Daily  . enoxaparin (LOVENOX) injection  40 mg Subcutaneous Q24H  . folic acid  1 mg Oral Daily  . loratadine  10 mg Oral Daily  . QUEtiapine  50 mg Oral QHS  . sertraline  100 mg Oral Daily  . [START ON 05/22/2018] thiamine  100 mg Oral Daily  . traZODone  100 mg Oral QHS   Continuous Infusions: . thiamine injection 500 mg (05/19/18 2209)   PRN Meds:.acetaminophen **OR** acetaminophen (TYLENOL) oral liquid 160 mg/5 mL **OR** acetaminophen, albuterol, LORazepam, senna-docusate  PHYSICAL EXAM: Vital signs: Vitals:   05/20/18 0041 05/20/18 0200 05/20/18 0403 05/20/18 0854  BP: (!) 128/109 140/90 135/89 (!) 145/91  Pulse: 65 63 72 78  Resp: 18  18 17   Temp: 97.9 F (36.6 C) 97.8 F (36.6 C) 97.8 F (36.6 C)   TempSrc: Oral Oral Oral   SpO2: 99% 100% 100% 100%   There were no vitals filed for this  visit. There is no height or weight on file to calculate BMI.   General appearance :Awake, confused-mumbles.  Does not follow any commands. Eyes:Pink conjunctiva HEENT: Atraumatic and Normocephalic Neck:No thyromegaly Resp:Good air entry bilaterally, no added sounds  CVS: S1 S2 regular, no murmurs.  GI: Bowel sounds present, Non tender and not distended with no gaurding, rigidity or rebound.No organomegaly Extremities: B/L Lower Ext shows no edema, both legs are warm to touch Neurology: Very difficult exam-but seems to be moving all 4 extremities.   Musculoskeletal:No digital cyanosis Skin:No Rash, warm and dry Wounds:N/A  I have personally reviewed following labs and imaging studies  LABORATORY DATA: CBC: Recent Labs  Lab 05/19/18 0854  WBC 10.3  NEUTROABS 7.5  HGB 11.9*  HCT 35.6*  MCV 93.4  PLT 144*    Basic Metabolic Panel: Recent Labs  Lab 05/19/18 0854  NA 139  K 4.0  CL 100  CO2 25  GLUCOSE 133*  BUN 13  CREATININE 1.16  CALCIUM 9.6    GFR: CrCl cannot be calculated (Unknown ideal weight.).  Liver Function Tests: Recent Labs  Lab 05/19/18 0854  AST 29  ALT 18  ALKPHOS 63  BILITOT 1.4*  PROT 7.1  ALBUMIN 4.0   No results for input(s): LIPASE, AMYLASE in the last 168 hours. Recent Labs  Lab 05/19/18 0905  AMMONIA 23    Coagulation Profile: Recent Labs  Lab 05/19/18 0854  INR 1.02    Cardiac Enzymes: No results for input(s): CKTOTAL, CKMB, CKMBINDEX, TROPONINI in the last 168 hours.  BNP (last 3 results) No results for input(s): PROBNP in the last 8760 hours.  HbA1C: Recent Labs    05/20/18 0529  HGBA1C 5.3    CBG: No results for input(s): GLUCAP in the last 168 hours.  Lipid Profile: Recent Labs    05/20/18 0529  CHOL 102  HDL 46  LDLCALC 42  TRIG 68  CHOLHDL 2.2    Thyroid Function Tests: No results for input(s): TSH, T4TOTAL, FREET4, T3FREE, THYROIDAB in the last 72 hours.  Anemia Panel: No results for  input(s): VITAMINB12, FOLATE, FERRITIN, TIBC, IRON, RETICCTPCT in the last 72 hours.  Urine analysis:    Component Value Date/Time   COLORURINE YELLOW 05/19/2018 0854   APPEARANCEUR CLEAR 05/19/2018 0854   LABSPEC 1.014 05/19/2018 0854   PHURINE 5.0 05/19/2018 0854   GLUCOSEU NEGATIVE 05/19/2018 0854   HGBUR NEGATIVE 05/19/2018 0854   HGBUR negative 08/28/2010 0959   BILIRUBINUR NEGATIVE 05/19/2018 0854   KETONESUR NEGATIVE 05/19/2018 0854   PROTEINUR NEGATIVE 05/19/2018 0854   UROBILINOGEN 1.0 02/20/2012 1944   NITRITE NEGATIVE 05/19/2018 0854   LEUKOCYTESUR NEGATIVE 05/19/2018 0854    Sepsis Labs: Lactic Acid, Venous    Component Value Date/Time   LATICACIDVEN 1.37 05/19/2018 1106    MICROBIOLOGY: Recent Results (from the past 240 hour(s))  Urine culture     Status: None   Collection Time: 05/19/18  8:56 AM  Result Value Ref Range Status   Specimen Description URINE, CATHETERIZED  Final   Special Requests NONE  Final  Culture   Final    NO GROWTH Performed at [facility], [address] [place], [place] [address]    Report Status 05/20/2018 FINAL  Final    RADIOLOGY STUDIES/RESULTS: Dg Chest 2 View  Result Date: 05/19/2018 CLINICAL DATA:  Altered mental status EXAM: CHEST - 2 VIEW COMPARISON:  08/01/2017 FINDINGS: The heart size and mediastinal contours are within normal limits. Both lungs are clear. The visualized skeletal structures are unremarkable. IMPRESSION: No active cardiopulmonary disease. Electronically Signed   By: Alcide CleverMark  Lukens M.D.   On: 05/19/2018 09:51   Ct Head Wo Contrast  Result Date: 05/19/2018 CLINICAL DATA:  Previous stroke.  Mental status changes. EXAM: CT HEAD WITHOUT CONTRAST TECHNIQUE: Contiguous axial images were obtained from the base of the skull through the vertex without intravenous contrast. COMPARISON:  08/03/2017 FINDINGS: Brain: Generalized atrophy. Extensive chronic small-vessel ischemic changes throughout the cerebral  hemispheric white matter. Old left MCA territory stroke affecting the temporal lobe, insula and frontoparietal brain. This has progressed to atrophy and encephalomalacia. No CT evidence of acute infarction, mass lesion, hemorrhage, hydrocephalus or extra-axial collection. Vascular: There is atherosclerotic calcification of the major vessels at the base of the brain. Skull: Negative Sinuses/Orbits: Clear/normal Other: None IMPRESSION: Old left MCA territory infarction with atrophy and encephalomalacia. Advanced chronic small-vessel ischemic change throughout the hemispheric white matter. No sign of acute or subacute lesion. These results were communicated to Dr. Laurence SlateAroor At 10:13 amon 8/26/2019by text page via the [facility] messaging system. Electronically Signed   By: Paulina FusiMark  Shogry M.D.   On: 05/19/2018 10:14   Mr Brain Wo Contrast  Result Date: 05/19/2018 CLINICAL DATA:  Altered mental status EXAM: MRI HEAD WITHOUT CONTRAST TECHNIQUE: Multiplanar, multiecho pulse sequences of the brain and surrounding structures were obtained without intravenous contrast. COMPARISON:  Head CT 05/19/2018 FINDINGS: The examination had to be discontinued prior to completion due to patient altered mental status and inability to follow the technologist's instructions. Axial diffusion-weighted imaging, T2-weighted imaging and FLAIR sequences were obtained. There is no abnormal diffusion restriction to indicate acute infarct. There is no midline shift or other mass effect. There is generalized volume loss and diffuse confluent white matter hyperintense T2-weighted signal. There is encephalomalacia in the posterior left MCA distribution at the site prior infarct. Paranasal sinuses are clear. Normal orbits. IMPRESSION: 1. Truncated examination without acute abnormality. 2. Old left MCA territory infarct with advanced chronic ischemic microangiopathy and parenchymal atrophy. Electronically Signed   By: Deatra RobinsonKevin  Herman M.D.   On: 05/19/2018 17:56       LOS: 1 day   Jeoffrey MassedShanker Ghimire, MD  [facility]  If 7PM-7AM, please contact night-coverage  Please page via [email or web address] [email or web address]-click on MD name and type text message  05/20/2018, 11:05 AM

## 2018-05-20 NOTE — Evaluation (Signed)
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 With max HOH pt able to complete ~30 seconds of oral care with toothbrush. 2/2 cognitive deficits pt requires 24 hr (S)/A and would benefit from SNF placement to ensure safety.     Follow Up Recommendations  SNF;Supervision/Assistance - 24 hour    Equipment Recommendations  Other (comment)(defer to next venue)    Recommendations for Other Services PT consult;Speech consult     Precautions / Restrictions Precautions Precautions: Fall Restrictions Weight Bearing Restrictions: No      Mobility Bed Mobility               General bed mobility comments: pt in recliner upon entry  Transfers                      Balance                                           ADL either performed or assessed with clinical judgement   ADL Overall ADL's : Needs assistance/impaired     Grooming: Oral care;Maximal assistance;Cueing for sequencing Grooming Details (indicate cue type and reason): max HOH                             Functional mobility during ADLs: (Pt made attempt to stand with mod A and then sat back down) General ADL Comments: unable to fully assess 2/2 cognition     Vision   Additional Comments: Unable to assess 2/2 cognition     Perception     Praxis      Pertinent Vitals/Pain Pain Assessment: No/denies pain Faces Pain Scale: No hurt     Hand Dominance Left   Extremity/Trunk  Assessment Upper Extremity Assessment Upper Extremity Assessment: Difficult to assess due to impaired cognition   Lower Extremity Assessment Lower Extremity Assessment: Defer to PT evaluation   Cervical / Trunk Assessment Cervical / Trunk Assessment: Kyphotic   Communication Communication Communication: Prefers language other than English;Expressive difficulties;Other (comment)   Cognition Arousal/Alertness: Awake/alert Behavior During Therapy: Restless Overall Cognitive Status: History of cognitive impairments - at baseline                                 General Comments: Unable to consistently follow directions with demo   General Comments       Exercises     Shoulder Instructions      Home Living Family/patient expects to be discharged to:: Private residence Living Arrangements: Children;Other relatives Available Help at Discharge: Family;Available 24 hours/day Type of Home: House Home Access: Stairs to enter     Home Layout: Two level Alternate Level Stairs-Number of Steps: 12 Alternate Level Stairs-Rails: Left Bathroom Shower/Tub: Chief Strategy Officer: Standard     Home Equipment: Cane - single point;Bedside commode;Shower seat;Wheelchair - [employer] -  2 wheels   Additional Comments: all information obtained from previous admission chart review  Lives With: Family    Prior Functioning/Environment Level of Independence: Independent with assistive device(s)        Comments: patient information obtained from previous admission         OT Problem List: Decreased cognition;Decreased coordination;Impaired balance (sitting and/or standing);Decreased activity tolerance;Decreased safety awareness      OT Treatment/Interventions: Self-care/ADL training;Therapeutic exercise;DME and/or AE instruction;Therapeutic activities;Cognitive remediation/compensation;Patient/family education;Balance training    OT Goals(Current goals can be  found in the care plan section) Acute Rehab OT Goals Patient Stated Goal: none stated OT Goal Formulation: Patient unable to participate in goal setting Potential to Achieve Goals: Poor  OT Frequency: Min 2X/week   Barriers to D/C:            Co-evaluation              AM-PAC PT "6 Clicks" Daily Activity     Outcome Measure Help from another person eating meals?: A Lot Help from another person taking care of personal grooming?: A Lot Help from another person toileting, which includes using toliet, bedpan, or urinal?: A Lot Help from another person bathing (including washing, rinsing, drying)?: A Lot Help from another person to put on and taking off regular upper body clothing?: A Lot Help from another person to put on and taking off regular lower body clothing?: A Lot 6 Click Score: 12   End of Session    Activity Tolerance: Other (comment)(pt limited by cognition) Patient left: in chair;with call bell/phone within reach;with chair alarm set  OT Visit Diagnosis: Adult, failure to thrive (R62.7);Cognitive communication deficit (R41.841);Muscle weakness (generalized) (M62.81)                Time: [id number] OT Time Calculation (min): 20 min Charges:  OT General Charges $OT Visit: 1 Visit OT Evaluation $OT Eval Moderate Complexity: 1 Mod  Crissie ReeseSandra H Mandy Peeks OTR/L 05/20/2018, 2:32 PM

## 2018-05-20 NOTE — Consult Note (Signed)
[facility] Face-to-Face Psychiatry Consult   Reason for Consult: Delirium  Referring Physician:  Dr. Sloan Leiter Patient Identification: [name] MRN:  [id number] Principal Diagnosis: Altered mental state Diagnosis:   Patient Active Problem List   Diagnosis Date Noted  . Aphasia [R47.01] 05/19/2018  . Aphasia as late effect of stroke [I69.320] 12/23/2017  . Cytotoxic cerebral edema ([place]) [G93.6]   . History of stroke [Z86.73]   . Agitation [R45.1]   . Blurred vision, bilateral [H53.8] 06/24/2017  . Dyslipidemia [E78.5] 04/10/2017  . 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 Acute ischemic left MCA stroke ([place]) [I63.512] 04/05/2017  . Left hemiparesis ([place]) [G81.94]   . Oropharyngeal dysphagia [R13.12]   . Confusion [R41.0]   . Altered mental state [R41.82]   . Acute CVA / Lt MCA 08/01/2017, Rt MCA 04/02/2017 [I63.9] 04/02/2017  . AKI (acute kidney injury) ([place]) [N17.9] 04/02/2017  . DM (diabetes mellitus) ([place]) [E11.9] 04/02/2017  . Elevated transaminase level [R74.0] 04/02/2017  . Cerebral thrombosis with cerebral infarction [I63.30] 03/28/2017  . Diabetic complication ([place]) [K18.4] 03/26/2017  . Dizziness [R42] 03/26/2017  . Postop check [Z09] 03/11/2012  . HYDROCELE, RIGHT [N43.3] 08/28/2010  . NOCTURIA [R35.1] 08/28/2010  . OTH&UNSPEC NONINFECTIOUS GASTROENTERITIS&COLITIS [K52.89] 06/29/2010  . ABDOMINAL PAIN-RLQ [R10.31] 06/29/2010  . ABNORMAL FINDINGS GI TRACT [R93.3] 06/29/2010  . HYPERTENSION, BENIGN ESSENTIAL [I10] 05/02/2010  . LEUKOCYTOSIS [D72.829] 04/20/2010  . COLITIS, HX OF [Z87.19] 04/20/2010    Total Time spent with patient: 1 hour  Subjective:   [name] is a 77 y.o. [place] male patient admitted with altered mental status.  HPI:   Per chart review, patient presented with worsening mental status from baseline with aggressive behavior, failure to thrive symptoms and mumbling speech for the past 4-5 days. Per daughter, his speech has slowly declined over the  past few months. He has been urinating and defecating everywhere. He is not eating. His daughter would like to take him home with maximal home health services. He has an aide 6 hours daily while she is working. He has a history of large MCA stroke with residual expressive aphasia and receptive aphasia. Home medications include Seroquel 50 mg qhs, Zoloft 100 mg daily and Trazodone 100 mg qhs.   On interview, Mr. Bartnick is incomprehensible and mumbles a few words with his eyes closed. Interpretor services are not used since patient speaks a dialect that is not available through [employer].  His daughter was at bedside and was able to provide his history.  She reports that he had his first stroke in July 2018.  He recovered well after completing rehab.  He had a second stroke in November 2018.  She reports that he was confused and had urinary and fecal incontinence.  She reports that he got better with in home therapy.  He was walking and able to eat on his own.  She reports that since Thursday he has acutely deteriorated.  He became agitated and confused.  He has been throwing food on the floor and refusing to be assisted with eating.  He has also been threatening his aide and grandson with his cane.  She reports that he needs assistance with his ADLs at baseline.  He has dysphagia and coughs when swallowing liquids.  She reports that he has been depressed.  His medications are prescribed by his PCP.  He has never intentionally harmed himself and does not have a prior history of violence.  He has not been responding to internal stimuli.  She reports  that his memory has been poor since his stroke.  He also has poor sleep and poor appetite since Friday.  Past Psychiatric History: Depression   Risk to Self:  None per daughter.  Risk to Others:  Daughter reports that he has been agitated.  Prior Inpatient Therapy:  Daughter denies.  Prior Outpatient Therapy:  His medications are managed by his PCP.    Past Medical History:  Past Medical History:  Diagnosis Date  . Diabetes ([place])   . Dizziness   . Hypertension   . Stroke [facility])    x 2, last Nov 2018    Past Surgical History:  Procedure Laterality Date  . LAPAROSCOPIC APPENDECTOMY  02/21/2012   Procedure: APPENDECTOMY LAPAROSCOPIC;  Surgeon: Gwenyth Ober, MD;  Location: [facility] OR;  Service: General;  Laterality: N/A;   Family History:  Family History  Problem Relation Age of Onset  . Diabetes Neg Hx   . Stroke Neg Hx   . Cancer Neg Hx    Family Psychiatric  History: None per daughter.  Social History:  Social History   Substance and Sexual Activity  Alcohol Use No     Social History   Substance and Sexual Activity  Drug Use No    Social History   Socioeconomic History  . Marital status: Widowed    Spouse name: Not on file  . Number of children: Not on file  . Years of education: Not on file  . Highest education level: Not on file  Occupational History  . Not on file  Social Needs  . Financial resource strain: Not on file  . Food insecurity:    Worry: Not on file    Inability: Not on file  . Transportation needs:    Medical: Not on file    Non-medical: Not on file  Tobacco Use  . Smoking status: Never Smoker  . Smokeless tobacco: Never Used  Substance and Sexual Activity  . Alcohol use: No  . Drug use: No  . Sexual activity: Not on file  Lifestyle  . Physical activity:    Days per week: Not on file    Minutes per session: Not on file  . Stress: Not on file  Relationships  . Social connections:    Talks on phone: Not on file    Gets together: Not on file    Attends religious service: Not on file    Active member of club or organization: Not on file    Attends meetings of clubs or organizations: Not on file    Relationship status: Not on file  Other Topics Concern  . Not on file  Social History Narrative   Lives in apt with 2 daughters who care for him.  He drinks tea in am.     Additional  Social History: He moved to the [place] in 2011 from [place]. He lives at home with his daughter and 63 y/o grandson. He has 4 adult daughters (one daughter is deceased). He is unemployed. He does not use alcohol or illicit drugs. 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     Allergies:  No Known Allergies  Labs:  Results for orders placed or performed during the hospital encounter of 05/19/18 (from the past 48 hour(s))  Comprehensive metabolic panel     Status: Abnormal   Collection Time: 05/19/18  8:54 AM  Result Value Ref Range   Sodium 139 135 - 145 mmol/L   Potassium  4.0 3.5 - 5.1 mmol/L   Chloride 100 98 - 111 mmol/L   CO2 25 22 - 32 mmol/L   Glucose, Bld 133 (H) 70 - 99 mg/dL   BUN 13 8 - 23 mg/dL   Creatinine, Ser 1.16 0.61 - 1.24 mg/dL   Calcium 9.6 8.9 - 10.3 mg/dL   Total Protein 7.1 6.5 - 8.1 g/dL   Albumin 4.0 3.5 - 5.0 g/dL   AST 29 15 - 41 U/L   ALT 18 0 - 44 U/L   Alkaline Phosphatase 63 38 - 126 U/L   Total Bilirubin 1.4 (H) 0.3 - 1.2 mg/dL   GFR calc non Af Amer 59 (L) >60 mL/min   GFR calc Af Amer >60 >60 mL/min    Comment: (NOTE) The eGFR has been calculated using the CKD EPI equation. This calculation has not been validated in all clinical situations. eGFR's persistently <60 mL/min signify possible Chronic Kidney Disease.    Anion gap 14 5 - 15    Comment: Performed at [facility] [address], [place], [place] [address]  CBC with Differential     Status: Abnormal   Collection Time: 05/19/18  8:54 AM  Result Value Ref Range   WBC 10.3 4.0 - 10.5 K/uL   RBC 3.81 (L) 4.22 - 5.81 MIL/uL   Hemoglobin 11.9 (L) 13.0 - 17.0 g/dL   HCT 35.6 (L) 39.0 - 52.0 %   MCV 93.4 78.0 - 100.0 fL   MCH 31.2 26.0 - 34.0 pg   MCHC 33.4 30.0 - 36.0 g/dL   RDW 13.5 11.5 - 15.5 %   Platelets 144 (L) 150 - 400 K/uL   Neutrophils Relative % 72 %   Neutro Abs 7.5 1.7 - 7.7 K/uL   Lymphocytes Relative 13 %   Lymphs Abs 1.3 0.7 - 4.0 K/uL    Monocytes Relative 12 %   Monocytes Absolute 1.2 (H) 0.1 - 1.0 K/uL   Eosinophils Relative 2 %   Eosinophils Absolute 0.2 0.0 - 0.7 K/uL   Basophils Relative 0 %   Basophils Absolute 0.0 0.0 - 0.1 K/uL   Immature Granulocytes 1 %   Abs Immature Granulocytes 0.1 0.0 - 0.1 K/uL    Comment: Performed at [facility], [place] [address] [place], [place] [address]  Urinalysis, Routine w reflex microscopic     Status: None   Collection Time: 05/19/18  8:54 AM  Result Value Ref Range   Color, Urine YELLOW YELLOW   APPearance CLEAR CLEAR   Specific Gravity, Urine 1.014 1.005 - 1.030   pH 5.0 5.0 - 8.0   Glucose, UA NEGATIVE NEGATIVE mg/dL   Hgb urine dipstick NEGATIVE NEGATIVE   Bilirubin Urine NEGATIVE NEGATIVE   Ketones, ur NEGATIVE NEGATIVE mg/dL   Protein, ur NEGATIVE NEGATIVE mg/dL   Nitrite NEGATIVE NEGATIVE   Leukocytes, UA NEGATIVE NEGATIVE    Comment: Performed at [facility] [address] [place], [place] [address]  Protime-INR     Status: None   Collection Time: 05/19/18  8:54 AM  Result Value Ref Range   Prothrombin Time 13.3 11.4 - 15.2 seconds   INR 1.02     Comment: Performed at [facility] [address] [place], [place] [address]  Urine culture     Status: None   Collection Time: 05/19/18  8:56 AM  Result Value Ref Range   Specimen Description URINE, CATHETERIZED    Special Requests NONE  Culture      NO GROWTH Performed at [facility], [place] [address], [place], [place] [address]    Report Status 05/20/2018 FINAL   I-Stat CG4 Lactic Acid, ED     Status: Abnormal   Collection Time: 05/19/18  9:02 AM  Result Value Ref Range   Lactic Acid, Venous 3.14 (HH) 0.5 - 1.9 mmol/L   Comment NOTIFIED PHYSICIAN   Ammonia     Status: None   Collection Time: 05/19/18  9:05 AM  Result Value Ref Range   Ammonia 23 9 - 35 umol/L    Comment: Performed at [facility], [place] [address], [place], [place] [address]  I-Stat CG4 Lactic Acid, ED      Status: None   Collection Time: 05/19/18 11:06 AM  Result Value Ref Range   Lactic Acid, Venous 1.37 0.5 - 1.9 mmol/L  Hemoglobin A1c     Status: None   Collection Time: 05/20/18  5:29 AM  Result Value Ref Range   Hgb A1c MFr Bld 5.3 4.8 - 5.6 %    Comment: (NOTE) Pre diabetes:          5.7%-6.4% Diabetes:              >6.4% Glycemic control for   <7.0% adults with diabetes    Mean Plasma Glucose 105.41 mg/dL    Comment: Performed at [facility] [address], [place], [place] [address]  Lipid panel     Status: None   Collection Time: 05/20/18  5:29 AM  Result Value Ref Range   Cholesterol 102 0 - 200 mg/dL   Triglycerides 68 <150 mg/dL   HDL 46 >40 mg/dL   Total CHOL/HDL Ratio 2.2 RATIO   VLDL 14 0 - 40 mg/dL   LDL Cholesterol 42 0 - 99 mg/dL    Comment:        Total Cholesterol/HDL:CHD Risk Coronary Heart Disease Risk Table                     Men   Women  1/2 Average Risk   3.4   3.3  Average Risk       5.0   4.4  2 X Average Risk   9.6   7.1  3 X Average Risk  23.4   11.0        Use the calculated Patient Ratio above and the CHD Risk Table to determine the patient's CHD Risk.        ATP III CLASSIFICATION (LDL):  <100     mg/dL   Optimal  100-129  mg/dL   Near or Above                    Optimal  130-159  mg/dL   Borderline  160-189  mg/dL   High  >190     mg/dL   Very High Performed at [facility] [address], [place], [place] [address]   TSH     Status: Abnormal   Collection Time: 05/20/18 10:05 AM  Result Value Ref Range   TSH 6.624 (H) 0.350 - 4.500 uIU/mL    Comment: Performed by a 3rd Generation assay with a functional sensitivity of <=0.01 uIU/mL. Performed at [facility], [place] [address], [place], [place] [address]   Vitamin B12     Status: None   Collection Time: 05/20/18 10:05 AM  Result Value Ref Range  Vitamin B-12 354 180 - 914 pg/mL    Comment: (NOTE) This assay is not validated for testing neonatal  or myeloproliferative syndrome specimens for Vitamin B12 levels. Performed at [facility], [place] [address] [place], [place] [address]   Glucose, capillary     Status: None   Collection Time: 05/20/18 11:38 AM  Result Value Ref Range   Glucose-Capillary 91 70 - 99 mg/dL   Comment 1 Notify RN    Comment 2 Document in Chart     Current Facility-Administered Medications  Medication Dose Route Frequency Provider Last Rate Last Dose  . acetaminophen (TYLENOL) tablet 650 mg  650 mg Oral Q4H PRN Domenic Polite, MD       Or  . acetaminophen (TYLENOL) solution 650 mg  650 mg Per Tube Q4H PRN Domenic Polite, MD       Or  . acetaminophen (TYLENOL) suppository 650 mg  650 mg Rectal Q4H PRN Domenic Polite, MD      . albuterol (PROVENTIL) (2.5 MG/3ML) 0.083% nebulizer solution 3 mL  3 mL Inhalation Q6H PRN Domenic Polite, MD      . aspirin suppository 300 mg  300 mg Rectal Daily Domenic Polite, MD       Or  . aspirin tablet 325 mg  325 mg Oral Daily Domenic Polite, MD      . atorvastatin (LIPITOR) tablet 80 mg  80 mg Oral q1800 Domenic Polite, MD      . clopidogrel (PLAVIX) tablet 75 mg  75 mg Oral Daily Domenic Polite, MD      . enoxaparin (LOVENOX) injection 40 mg  40 mg Subcutaneous Q24H Domenic Polite, MD   40 mg at 05/19/18 2256  . folic acid (FOLVITE) tablet 1 mg  1 mg Oral Daily Domenic Polite, MD      . insulin aspart (novoLOG) injection 0-9 Units  0-9 Units Subcutaneous TID WC Ghimire, Henreitta Leber, MD      . loratadine (CLARITIN) tablet 10 mg  10 mg Oral Daily Domenic Polite, MD      . LORazepam (ATIVAN) tablet 0.5 mg  0.5 mg Oral Q8H PRN Amie Portland, MD      . QUEtiapine (SEROQUEL) tablet 50 mg  50 mg Oral QHS Domenic Polite, MD      . senna-docusate (Senokot-S) tablet 1 tablet  1 tablet Oral QHS PRN Domenic Polite, MD      . sertraline (ZOLOFT) tablet 100 mg  100 mg Oral Daily Domenic Polite, MD      . Derrill Memo ON 05/22/2018] thiamine (VITAMIN B-1) tablet 100 mg  100  mg Oral Daily Marliss Coots, PA-C      . thiamine 550m in normal saline (511m IVPB  500 mg Intravenous Daily ArAmie PortlandMD 100 mL/hr at 05/19/18 2209 500 mg at 05/19/18 2209  . traZODone (DESYREL) tablet 100 mg  100 mg Oral QHS JoDomenic PoliteMD        Musculoskeletal: Strength & Muscle Tone: decreased due to physical deconditioning. Gait & Station: UTA since patient is lying in bed. Patient leans: N/A  Psychiatric Specialty Exam: Physical Exam  Nursing note and vitals reviewed. Constitutional: He appears well-developed and well-nourished.  HENT:  Head: Normocephalic and atraumatic.  Neck: Normal range of motion.  Respiratory: Effort normal.  Musculoskeletal: Normal range of motion.  Neurological: He is alert.  Skin: No rash noted.  Psychiatric: His speech is normal and behavior is normal. Judgment and thought content normal. His affect is blunt. Cognition  and memory are impaired.    Review of Systems  Unable to perform ROS: Mental status change    Blood pressure (!) 155/71, pulse 88, temperature (!) 97.4 F (36.3 C), temperature source Axillary, resp. rate 14, SpO2 100 %.There is no height or weight on file to calculate BMI.  General Appearance: Fairly Groomed, elderly, [place] male, wearing a hospital gown and lying in bed. NAD.   Eye Contact:  Poor since eyes closed.  Speech:  Garbled  Volume:  Decreased  Mood:  UTA due to AMS.  Affect:  Dysphoric  Thought Process:  UTA due to AMS and language barrier.  Orientation:  UTA due to AMS and language barrier.  Thought Content:  UTA due to AMS and language barrier.  Suicidal Thoughts:  UTA due to AMS and language barrier.  Homicidal Thoughts:  UTA due to AMS and language barrier.  Memory:  UTA due to AMS and language barrier.  Judgement:  UTA due to AMS and language barrier.  Insight:  UTA due to AMS and language barrier.  Psychomotor Activity:  Decreased  Concentration:  UTA due to AMS and language barrier.   Recall:  UTA due to AMS and language barrier.  Fund of Knowledge:  UTA due to AMS and language barrier.  Language:  UTA due to AMS and language barrier.  Akathisia:  NA  Handed:  Right  AIMS (if indicated):   N/A  Assets:  Housing Social Support  ADL's:  Impaired  Cognition:  Impaired due to AMS.  Sleep:   Poor per daughter.    Assessment:  Arvell Pulsifer is a 77 y.o. 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 History was obtained from his daughter due to language barrier. He has been agitated and confused since Thursday. She also reports poor sleep and poor appetite. Recommend low dose Zyprexa for symptom management and discontinue Seroquel.    Treatment Plan Summary: -Start Zyprexa 2.5 mg qhs and 2.5 mg daily PRN for insomnia, poor appetite and agitation.  -Discontinue Seroquel. -Continue Trazodone 100 mg qhs for insomnia. Can switch to PRN use if patient is oversedated after starting Zyprexa.  -Continue Zoloft 100 mg daily.  -EKG reviewed and QTc 462 on 8/26. Please closely monitor when starting or increasing QTc prolonging agents.  -Psychiatry will sign off on patient at this time. Please consult psychiatry again as needed.   Disposition: Patient does not meet criteria for psychiatric inpatient admission.  Faythe Dingwall, DO 05/20/2018 2:19 PM

## 2018-05-20 NOTE — Evaluation (Signed)
Speech Language Pathology Evaluation Patient Details Name: [name] MRN: [id number] DOB: 1941/08/15 Today's Date: 05/20/2018 Time: 4098-1191 SLP Time Calculation (min) (ACUTE ONLY): 11 min  Problem List:  Patient Active Problem List   Diagnosis Date Noted  . Aphasia 05/19/2018  . Aphasia as late effect of stroke 12/23/2017  . Cytotoxic cerebral edema (HCC)   . History of stroke   . Agitation   . Blurred vision, bilateral 06/24/2017  . Dyslipidemia 04/10/2017  . 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 Acute ischemic left MCA stroke (HCC) 04/05/2017  . Left hemiparesis (HCC)   . Oropharyngeal dysphagia   . Confusion   . Altered mental state   . Acute CVA / Lt MCA 08/01/2017, Rt MCA 04/02/2017 04/02/2017  . AKI (acute kidney injury) (HCC) 04/02/2017  . DM (diabetes mellitus) (HCC) 04/02/2017  . Elevated transaminase level 04/02/2017  . 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 Diabetic complication (HCC) 03/26/2017  . Dizziness 03/26/2017  . Postop check 03/11/2012  . HYDROCELE, RIGHT 08/28/2010  . NOCTURIA 08/28/2010  . OTH&UNSPEC NONINFECTIOUS GASTROENTERITIS&COLITIS 06/29/2010  . ABDOMINAL PAIN-RLQ 06/29/2010  . ABNORMAL FINDINGS GI TRACT 06/29/2010  . HYPERTENSION, BENIGN ESSENTIAL 05/02/2010  . LEUKOCYTOSIS 04/20/2010  . COLITIS, HX OF 04/20/2010   Past Medical History:  Past Medical History:  Diagnosis Date  . Diabetes (HCC)   . Dizziness   . Hypertension   . Stroke [facility])    x 2, last Nov 2018   Past Surgical History:  Past Surgical History:  Procedure Laterality Date  . LAPAROSCOPIC APPENDECTOMY  02/21/2012   Procedure: APPENDECTOMY LAPAROSCOPIC;  Surgeon: Cherylynn Ridges, MD;  Location: [facility] OR;  Service: General;  Laterality: N/A;   HPI:  Patient is a 77 y.o. 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  MRI negative for acute changes.   Assessment / Plan / Recommendation Clinical Impression   Pt presents with severe cognitive-linguistic deficits which appear worsened in comparison to previous hospital admissions.  Pt was unable to follow commands or answer yes/no questions, even in a basic, familiar context.; however, following hand over hand assist and task set up, he could complete simple tasks in a routine and automatic fashion.  Attention to tasks was fleeting, pt was restless, and he had several instances of inappropriate behaviors where he grasped at objects nonpurposefully or used them incorrectly.   Pt verbalized only 3-4 times during evaluation, verbalizations were incomprehensible (per telephone interpreter) due to rapid rushes of speech and low vocal intensity; therefore it is unclear to what extent language is impaired.  Recommending SNF if family is not able to provide 24/7 supervision at discharge.      SLP Assessment  SLP Recommendation/Assessment: Patient needs continued Speech Lanaguage Pathology Services SLP Visit Diagnosis: Cognitive communication deficit (R41.841)    Follow Up Recommendations  Skilled Nursing facility;24 hour supervision/assistance    Frequency and Duration min 1 x/week         SLP Evaluation Cognition  Overall Cognitive Status: History of cognitive impairments - at baseline Arousal/Alertness: Awake/alert Orientation Level: Oriented to person Attention: Focused Focused Attention: Impaired Focused Attention Impairment: Verbal basic;Functional basic Awareness: Impaired Awareness Impairment: Intellectual impairment Problem Solving: Impaired Problem Solving Impairment: Functional basic;Verbal basic Executive Function: (all impaired due to lower level deficits )  Behaviors: Restless Safety/Judgment: Impaired        Comprehension  Auditory Comprehension Overall Auditory Comprehension: Impaired at baseline Yes/No Questions: Impaired Basic Biographical Questions: 0-25% accurate Commands: Impaired One Step Basic Commands: 0-24% accurate Interfering Components: Attention EffectiveTechniques: Repetition;Visual/Gestural cues    Expression Expression Primary Mode of Expression: Verbal Verbal Expression Overall Verbal Expression: Impaired at baseline Initiation: Impaired Level of Generative/Spontaneous Verbalization: Phrase Pragmatics: Impairment Impairments: Eye contact;Abnormal affect Interfering Components: Attention;Premorbid deficit Other Verbal Expression Comments: pt only initiated verbalizations 3-4 times during eval, output was incomprehensible due to low vocal intensity and rapid rushes of speech   Oral / Motor  Oral Motor/Sensory Function Overall Oral Motor/Sensory Function: Within functional limits Motor Speech Overall Motor Speech: Impaired(minimally verbal, output is incomprehensible)   GO                    Donel Osowski, Melanee SpryNicole L 05/20/2018, 2:20 PM

## 2018-05-20 NOTE — Evaluation (Signed)
Clinical/Bedside Swallow Evaluation Patient Details  Name: [name] MRN: [id number] Date of Birth: 05/28/1941  Today's Date: 05/20/2018 Time: SLP Start Time (ACUTE ONLY): 1328 SLP Stop Time (ACUTE ONLY): 1339 SLP Time Calculation (min) (ACUTE ONLY): 11 min  Past Medical History:  Past Medical History:  Diagnosis Date  . Diabetes (HCC)   . Dizziness   . Hypertension   . Stroke [facility](HCC)    x 2, last Nov 2018   Past Surgical History:  Past Surgical History:  Procedure Laterality Date  . LAPAROSCOPIC APPENDECTOMY  02/21/2012   Procedure: APPENDECTOMY LAPAROSCOPIC;  Surgeon: Cherylynn RidgesJames O Wyatt, MD;  [facility];  Service: General;  Laterality: N/A;   HPI:  Patient is a 77 y.o. 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  MRI negative for acute changes.   Assessment / Plan / Recommendation Clinical Impression   Pt presents with a cognitively based dysphagia.  Pt needed max to total cues for initiation and sequencing of self feeding due to restless behaviors and fleeting attention to task.  Once task automaticity set in, pt was able to feed himself with intermittent cues for correction of inappropriate behaviors (ie eating napkin).  Oral phase was functional and timely when consuming liquids, solids, and purees.  No overt s/s of aspiration were evident with solids or liquids.  No family was present to verify baseline diet; however, previous ST notes indicate that pt's daughter was preparing soft foods for him at home, specifically puddings and rice.  As a result, recommend a dys 3 diet and thin liquids with full supervision for use of swallowing precautions.  Cognitive-linguistic evaluation was also completed.  Please see report  to follow.   SLP Visit Diagnosis: Dysphagia, unspecified (R13.10)    Aspiration Risk  Mild aspiration risk;Moderate aspiration risk    Diet Recommendation Dysphagia 3 (Mech soft);Thin liquid   Liquid Administration via: Cup;Straw Medication Administration: Crushed with puree Supervision: Full supervision/cueing for compensatory strategies Compensations: Minimize environmental distractions;Slow rate;Small sips/bites Postural Changes: Seated upright at 90 degrees;Remain upright for at least 30 minutes after po intake    Other  Recommendations Oral Care Recommendations: Oral care BID   Follow up Recommendations Skilled Nursing facility;24 hour supervision/assistance      Frequency and Duration min 1 x/week          Prognosis Prognosis for Safe Diet Advancement: Good Barriers to Reach Goals: Cognitive deficits      Swallow Study   General HPI: Patient is a 77 y.o. 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  MRI negative for acute changes. Type of Study: Bedside Swallow Evaluation Previous Swallow Assessment: MBS 07/2017  Diet Prior to this Study: NPO Temperature Spikes Noted: No Respiratory Status: Room air History of Recent Intubation: No Behavior/Cognition: Alert;Confused;Distractible;Doesn't follow directions Oral Cavity Assessment: Other (comment)(difficult to assess as pt does not follow commands) Oral Care Completed by SLP: No Oral Cavity - Dentition: Other (Comment)(difficult to assess as pt does not follow commands) Vision: Functional for self-feeding Self-Feeding Abilities: Able to feed self;Needs assist Patient Positioning: Upright in chair Baseline  Vocal Quality: Low vocal intensity Volitional  Cough: Cognitively unable to elicit Volitional Swallow: Unable to elicit    Oral/Motor/Sensory Function Overall Oral Motor/Sensory Function: Within functional limits   Ice Chips     Thin Liquid Thin Liquid: Within functional limits    Nectar Thick     Honey Thick     Puree     Solid     Solid: Within functional limits      Jackalyn Lombard L 05/20/2018,2:08 PM

## 2018-05-20 NOTE — Progress Notes (Signed)
Went to pt's room to do EEG, pt appeared to be anxious and would not respond or look at tech - tech spoke with RN, RN said it would be fine to wait until speech clears pt so that he may have something to help him relax. Gave RN phone number to call when pt is able to have EEG. Will attempt again as schedule permits.

## 2018-05-20 NOTE — Progress Notes (Addendum)
Subjective: Upon entering the room patient was restfully sleeping.  Upon waking he became very startled.  Unfortunately I do not speak his language and he was not able to follow commands.  He is moving all extremities which appears equal.  Exam: Vitals:   05/20/18 0403 05/20/18 0854  BP: 135/89 (!) 145/91  Pulse: 72 78  Resp: 18 17  Temp: 97.8 F (36.6 C)   SpO2: 100% 100%    Physical Exam   HEENT-  Normocephalic, no lesions, without obvious abnormality.  Normal external eye and conjunctiva.   Extremities- Warm, dry and intact Musculoskeletal-no joint tenderness, deformity or swelling Skin-warm and dry, no hyperpigmentation, vitiligo, or suspicious lesions    Neuro:  Mental Status: Alert, initially was startled upon waking.  On attempts to have follow commands he does not follow commands.  On attempt to show him to follow commands he still does not follow commands.  He appears to be disgruntled and not want to take part in anything. Cranial Nerves: II: Attempted to assess visual exam however patient held eyes extremely tight and I could not open his eyelids III,IV, VI: ptosis not present, extra-ocular motions intact bilaterally pupils equal, round, reactive to light and accommodation V,VII: Face appears symmetric and he does wince bilaterally to pain VIII: As noted he does wake with verbal stimuli  Motor: Motor exam today was extremely difficult as patient seemed disgruntled and did not want to take part in any portion of the exam he did withdraw from pain in the upper extremities with good 5 out of 5 strength bilaterally in the lower extremities he did withdraw from pain with 4/5 strength. Sensory: Withdraws from noxious stimuli in all 4 extremities Deep Tendon Reflexes: Attempted to get deep tendon reflexes at that point patient started to try to punch examiner thus this was unable to obtain Plantars: Right: downgoing   Left: downgoing   Medications:  Scheduled: . aspirin   300 mg Rectal Daily   Or  . aspirin  325 mg Oral Daily  . atorvastatin  80 mg Oral q1800  . clopidogrel  75 mg Oral Daily  . enoxaparin (LOVENOX) injection  40 mg Subcutaneous Q24H  . folic acid  1 mg Oral Daily  . loratadine  10 mg Oral Daily  . QUEtiapine  50 mg Oral QHS  . sertraline  100 mg Oral Daily  . [START ON 05/22/2018] thiamine  100 mg Oral Daily  . traZODone  100 mg Oral QHS   Continuous: . sodium chloride 1,000 mL (05/19/18 2130)  . thiamine injection 500 mg (05/19/18 2209)   UXL:[id number]:acetaminophen **OR** acetaminophen (TYLENOL) oral liquid 160 mg/5 mL **OR** acetaminophen, albuterol, LORazepam, senna-docusate  Pertinent Labs/Diagnostics: Awaiting EEG Urinalysis unremarkable Ammonia level within normal limits CBC within normal limit chest x-ray reported no active cardiopulmonary disease Thiamine level was not obtained as he has been receiving 500 mg of thiamine daily for the past 3 days TSH, B12, RPR, HIV have been ordered today   Dg Chest 2 View  Result Date: 05/19/2018 CLINICAL DATA:  Altered mental status EXAM: CHEST - 2 VIEW COMPARISON:  08/01/2017 FINDINGS: The heart size and mediastinal contours are within normal limits. Both lungs are clear. The visualized skeletal structures are unremarkable. IMPRESSION: No active cardiopulmonary disease. Electronically Signed   By: Alcide CleverMark  Lukens M.D.   On: 05/19/2018 09:51   Ct Head Wo Contrast  Result Date: 05/19/2018 CLINICAL DATA:  Previous stroke.  Mental status changes. EXAM: CT HEAD WITHOUT CONTRAST TECHNIQUE:  Contiguous axial images were obtained from the base of the skull through the vertex without intravenous contrast. COMPARISON:  08/03/2017 FINDINGS: Brain: Generalized atrophy. Extensive chronic small-vessel ischemic changes throughout the cerebral hemispheric white matter. Old left MCA territory stroke affecting the temporal lobe, insula and frontoparietal brain. This has progressed to atrophy and encephalomalacia. No CT  evidence of acute infarction, mass lesion, hemorrhage, hydrocephalus or extra-axial collection. Vascular: There is atherosclerotic calcification of the major vessels at the base of the brain. Skull: Negative Sinuses/Orbits: Clear/normal Other: None IMPRESSION: Old left MCA territory infarction with atrophy and encephalomalacia. Advanced chronic small-vessel ischemic change throughout the hemispheric white matter. No sign of acute or subacute lesion. These results were communicated to Dr. Laurence Slate At 10:13 amon 8/26/2019by text page via the [facility] messaging system. Electronically Signed   By: Paulina Fusi M.D.   On: 05/19/2018 10:14   Mr Brain Wo Contrast  Result Date: 05/19/2018 CLINICAL DATA:  Altered mental status EXAM: MRI HEAD WITHOUT CONTRAST TECHNIQUE: Multiplanar, multiecho pulse sequences of the brain and surrounding structures were obtained without intravenous contrast. COMPARISON:  Head CT 05/19/2018 FINDINGS: The examination had to be discontinued prior to completion due to patient altered mental status and inability to follow the technologist's instructions. Axial diffusion-weighted imaging, T2-weighted imaging and FLAIR sequences were obtained. There is no abnormal diffusion restriction to indicate acute infarct. There is no midline shift or other mass effect. There is generalized volume loss and diffuse confluent white matter hyperintense T2-weighted signal. There is encephalomalacia in the posterior left MCA distribution at the site prior infarct. Paranasal sinuses are clear. Normal orbits. IMPRESSION: 1. Truncated examination without acute abnormality. 2. Old left MCA territory infarct with advanced chronic ischemic microangiopathy and parenchymal atrophy. Electronically Signed   By: Deatra Robinson M.D.   On: 05/19/2018 17:56     Felicie Morn PA-C [facility] [phone]   05/20/2018, 9:15 AM   Assessment: As noted prior this is a 77 year old male with past medical history of  large MCA stroke containing residual expressive aphasia and receptive aphasia who was brought to the hospital secondary to mental status and agitation since Thursday.  Thus far, work-up has been negative.  MRI brain shows no new stroke. EEG performed - read pending.   Metabolic Encephalopathy  Recommendations: #EEG #To evaluate for lab work-up as noted TSH, B12, RPR, HIV #Continue thiamine with 100 mg daily starting on 8/28/2019n #Likely would benefit from outpatient psychiatry to evaluate and possibly change medications to help patient with any occasions which may help with agitation. #Continue Seroquel for agitation. Avoid benzos.    NEUROHOSPITALIST ADDENDUM Performed face to face diagnostic evaluation.  I have reviewed the contents of history and physical exam as documented by PA/ARNP/Resident and agree with above documentation.  I have discussed and formulated the above plan as documented. Edits to the note have been made as needed.   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 EEG ordered to r/o subclinical seizures- however less likely to be so.  Sitting in chair today, mumbling. Unable to understand due to language barrier.  Agree with Thiamine, metabolic workup.      Georgiana Spinner Brown Dunlap MD [facility] [phone]   If 7pm to 7am, please call on call as listed on AMION.

## 2018-05-20 NOTE — Evaluation (Signed)
Physical Therapy Evaluation Patient Details Name: [name] MRN: [id number] DOB: 10/22/1940 Today's Date: 05/20/2018   History of Present Illness  77 year old male with past medical history of large MCA stroke containing residual expressive aphasia and receptive aphasia who was brought to the hospital secondary to mental status and agitation  Clinical Impression  Orders received for PT evaluation. Patient demonstrates deficits in functional mobility as indicated below. Will benefit from continued skilled PT to address deficits and maximize function. Will see as indicated and progress as tolerated.  Difficult assessment, unable to utilize tele interpreter and no family present to determine baseline level of function or provide instructions to patient.     Follow Up Recommendations Home health PT;Supervision/Assistance - 24 hour    Equipment Recommendations  None recommended by PT    Recommendations for Other Services       Precautions / Restrictions Precautions Precautions: Fall      Mobility  Bed Mobility Overal bed mobility: Needs Assistance Bed Mobility: Supine to Sit     Supine to sit: Min assist     General bed mobility comments: min assist to initiate movement to eob and elevate trunk as a tactile cue due to language barrier  Transfers Overall transfer level: Needs assistance Equipment used: 1 person hand held assist Transfers: Sit to/from Stand Sit to Stand: Min assist         General transfer comment: min assist for stability when coming to upright, tactile cues to initiate   Ambulation/Gait Ambulation/Gait assistance: Min assist Gait Distance (Feet): 40 Feet Assistive device: 2 person hand held assist Gait Pattern/deviations: Step-through pattern;Decreased stride length;Shuffle;Drifts right/left;Narrow base of support Gait velocity: decreased Gait velocity interpretation: <1.8 ft/sec, indicate of risk for recurrent falls General Gait Details: patient  with some instability during ambulation, HHA for support/stability and tactile cues  Stairs            Wheelchair Mobility    Modified Rankin (Stroke Patients Only)       Balance Overall balance assessment: Needs assistance   Sitting balance-Leahy Scale: Fair     Standing balance support: Single extremity supported;During functional activity Standing balance-Leahy Scale: Poor Standing balance comment: HHA required to maintain stability in standing                             Pertinent Vitals/Pain Pain Assessment: Faces Faces Pain Scale: Hurts a little bit Pain Location: abdominal guarding Pain Descriptors / Indicators: Grimacing Pain Intervention(s): Monitored during session    Home Living Family/patient expects to be discharged to:: Private residence Living Arrangements: Children;Other relatives Available Help at Discharge: Family;Available 24 hours/day Type of Home: House Home Access: Stairs to enter     Home Layout: Two level Home Equipment: Cane - single point;Bedside commode;Shower seat;Wheelchair - [employer] Corporationmanual;Walker - 2 wheels Additional Comments: all information obtained from previous admission chart review    Prior Function Level of Independence: Independent with assistive device(s)         Comments: patient information obtained from previous admission      Hand Dominance   Dominant Hand: Left    Extremity/Trunk Assessment   Upper Extremity Assessment Upper Extremity Assessment: (moving all extremities )    Lower Extremity Assessment Lower Extremity Assessment: Generalized weakness(moving all extremities symmetrically)    Cervical / Trunk Assessment Cervical / Trunk Assessment: Kyphotic  Communication   Communication: Prefers language other than English;Expressive difficulties;Other (comment)(family memebers no present unable to utilize tele interpreter)  Cognition Arousal/Alertness: Awake/alert   Overall Cognitive Status:  Difficult to assess                                        General Comments      Exercises     Assessment/Plan    PT Assessment Patient needs continued PT services  PT Problem List Decreased activity tolerance;Decreased balance;Decreased mobility;Decreased cognition;Decreased safety awareness       PT Treatment Interventions DME instruction;Gait training;Functional mobility training;Therapeutic activities;Stair training;Therapeutic exercise;Balance training;Neuromuscular re-education;Cognitive remediation;Patient/family education    PT Goals (Current goals can be found in the Care Plan section)  Acute Rehab PT Goals Patient Stated Goal: none stated PT Goal Formulation: Patient unable to participate in goal setting Time For Goal Achievement: 06/03/18 Potential to Achieve Goals: Fair    Frequency Min 3X/week   Barriers to discharge        Co-evaluation               AM-PAC PT "6 Clicks" Daily Activity  Outcome Measure Difficulty turning over in bed (including adjusting bedclothes, sheets and blankets)?: Unable Difficulty moving from lying on back to sitting on the side of the bed? : Unable Difficulty sitting down on and standing up from a chair with arms (e.g., wheelchair, bedside commode, etc,.)?: Unable Help needed moving to and from a bed to chair (including a wheelchair)?: A Little Help needed walking in hospital room?: A Little Help needed climbing 3-5 steps with a railing? : A Lot 6 Click Score: 11    End of Session   Activity Tolerance: Patient tolerated treatment well(restlessness) Patient left: in chair;with call bell/phone within reach;with chair alarm set Nurse Communication: Mobility status PT Visit Diagnosis: Unsteadiness on feet (R26.81);Muscle weakness (generalized) (M62.81);Other symptoms and signs involving the nervous system (R29.898)    Time: 6045-4098 PT Time Calculation (min) (ACUTE ONLY): 21 min   Charges:   PT  Evaluation $PT Eval Moderate Complexity: 1 Mod          Charlotte Crumb, PT DPT  Board Certified Neurologic Specialist [phone]   Fabio Asa 05/20/2018, 10:52 AM

## 2018-05-21 ENCOUNTER — Inpatient Hospital Stay (HOSPITAL_COMMUNITY): Payer: Medicare Other

## 2018-05-21 ENCOUNTER — Other Ambulatory Visit: Payer: Self-pay

## 2018-05-21 DIAGNOSIS — R4182 Altered mental status, unspecified: Secondary | ICD-10-CM | POA: Diagnosis not present

## 2018-05-21 DIAGNOSIS — E1149 Type 2 diabetes mellitus with other diabetic neurological complication: Secondary | ICD-10-CM | POA: Diagnosis not present

## 2018-05-21 DIAGNOSIS — G9341 Metabolic encephalopathy: Secondary | ICD-10-CM | POA: Diagnosis not present

## 2018-05-21 DIAGNOSIS — I10 Essential (primary) hypertension: Secondary | ICD-10-CM | POA: Diagnosis not present

## 2018-05-21 LAB — GLUCOSE, CAPILLARY
GLUCOSE-CAPILLARY: 88 mg/dL (ref 70–99)
GLUCOSE-CAPILLARY: 135 mg/dL — AB (ref 70–99)
Glucose-Capillary: 85 mg/dL (ref 70–99)

## 2018-05-21 LAB — HIV ANTIBODY (ROUTINE TESTING W REFLEX): HIV SCREEN 4TH GENERATION: NONREACTIVE

## 2018-05-21 LAB — RPR: RPR Ser Ql: NONREACTIVE

## 2018-05-21 MED ORDER — VITAMIN B-12 100 MCG PO TABS
100.0000 ug | ORAL_TABLET | Freq: Every day | ORAL | Status: DC
  Administered 2018-05-21: 100 ug via ORAL
  Filled 2018-05-21 (×2): qty 1

## 2018-05-21 MED ORDER — CYANOCOBALAMIN 1000 MCG/ML IJ SOLN: 1000.0000 ug | Freq: Once | INTRAMUSCULAR | 0 refills | Status: DC

## 2018-05-21 MED ORDER — CYANOCOBALAMIN 1000 MCG/ML IJ SOLN
1000.0000 ug | Freq: Every day | INTRAMUSCULAR | Status: DC
  Administered 2018-05-21 – 2018-05-22 (×2): 1000 ug via INTRAMUSCULAR
  Filled 2018-05-21 (×3): qty 1

## 2018-05-21 NOTE — Progress Notes (Signed)
EEG Completed; Results Pending  

## 2018-05-21 NOTE — Procedures (Signed)
ELECTROENCEPHALOGRAM REPORT   Patient: [name]       Room #: [id number] EEG No. ID: [id number] Age: 77 y.o.        Sex: male Referring Physician: Ghimire Report Date:  05/21/2018        Interpreting Physician: Thana FarrEYNOLDS, Ellwood Steidle  History: [name] is an 77 y.o. male with history of dementia and altered mental status  Medications:  ASA, Lipitor, Plavix, B12, Folvite, Insulin, Claritin, Zyprexa, Zoloft, Thiamine, Desyrel  Conditions of Recording:  This is a 21 channel routine scalp EEG performed with bipolar and monopolar montages arranged in accordance to the international 10/20 system of electrode placement. One channel was dedicated to EKG recording.  The patient is in the awake and confused state.  Description:  The background activity is slow and poorly organized.  The predominant rhythm is a polymorphic delta rhythm although on infrequent occasions some intermixed low frequency theta activity is noted as well.  This activity is continuous and diffusely distributed.  There is no change in this activity with eye opening and closure.  No epileptiform activity is noted.   Hyperventilation and intermittent photic stimulation were not performed.  IMPRESSION: This is an abnormal EEG secondary to general background slowing.  This finding may be seen with a diffuse disturbance that is etiologically nonspecific, but may include a metabolic encephalopathy, among other possibilities.  No epileptiform activity was noted.     Thana FarrLeslie Clare Fennimore, MD Neurology [phone] 05/21/2018, 11:52 AM

## 2018-05-21 NOTE — Progress Notes (Signed)
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 Ambulated 150 feet with handheld assist and shuffling, slow gait speed. Once we returned to room, pt stated he needed to use the bathroom, however, would not sit down on toilet despite multimodal cueing. NT and RN assisted PT with subsequent pericare in standing. Pt became agitated and combative with staff members, swinging his arms and was difficult to redirect. Also very unsteady with no upper extremity support and required quick return to sitting edge of bed to prevent from falling. Pt daughter still plans to take pt home with 24/7 care.    Follow Up Recommendations  Home health PT;Supervision/Assistance - 24 hour     Equipment Recommendations  None recommended by PT    Recommendations for Other Services       Precautions / Restrictions Precautions Precautions: Fall Restrictions Weight Bearing Restrictions: No    Mobility  Bed Mobility Overal bed mobility: Needs Assistance Bed Mobility: Supine to Sit;Sit to Supine     Supine to sit: Min assist Sit to supine: Min guard   General bed mobility comments: min assist to initiate movement to eob and elevate trunk as a tactile cue   Transfers Overall transfer level: Needs assistance Equipment used: 1 person hand held assist Transfers: Sit to/from Stand Sit to Stand: Min assist;+2 safety/equipment         General transfer comment: min assist for stability with handheld assist  Ambulation/Gait Ambulation/Gait assistance: Min assist;+2  safety/equipment Gait Distance (Feet): 150 Feet Assistive device: 2 person hand held assist Gait Pattern/deviations: Step-through pattern;Decreased stride length;Shuffle;Drifts right/left;Narrow base of support Gait velocity: decreased Gait velocity interpretation: <1.31 ft/sec, indicative of household ambulator General Gait Details: patient with decreased bilateral foot clearance which increased with fatigue. handheld support for tactile cues for direction, however, pt very resistant to turning    Stairs             Wheelchair Mobility    Modified Rankin (Stroke Patients Only)       Balance Overall balance assessment: Needs assistance   Sitting balance-Leahy Scale: Fair     Standing balance support: Single extremity supported;During functional activity Standing balance-Leahy Scale: Poor Standing balance comment: HHA required to maintain stability in standing                            Cognition Arousal/Alertness: Awake/alert   Overall Cognitive Status: Difficult to assess                                        Exercises      General Comments General comments (skin integrity, edema, etc.): pt daughter able to translate      Pertinent Vitals/Pain Pain Assessment: Faces Faces Pain Scale: No hurt    Home Living                      Prior Function  PT Goals (current goals can now be found in the care plan section) Acute Rehab PT Goals Patient Stated Goal: none stated PT Goal Formulation: Patient unable to participate in goal setting Time For Goal Achievement: 06/03/18 Potential to Achieve Goals: Fair    Frequency    Min 3X/week      PT Plan Current plan remains appropriate    Co-evaluation              AM-PAC PT "6 Clicks" Daily Activity  Outcome Measure  Difficulty turning over in bed (including adjusting bedclothes, sheets and blankets)?: Unable Difficulty moving from lying on back to  sitting on the side of the bed? : Unable Difficulty sitting down on and standing up from a chair with arms (e.g., wheelchair, bedside commode, etc,.)?: Unable Help needed moving to and from a bed to chair (including a wheelchair)?: A Little Help needed walking in hospital room?: A Little Help needed climbing 3-5 steps with a railing? : A Lot 6 Click Score: 11    End of Session Equipment Utilized During Treatment: Gait belt Activity Tolerance: Treatment limited secondary to agitation Patient left: in bed;with bed alarm set;with family/visitor present Nurse Communication: Mobility status PT Visit Diagnosis: Unsteadiness on feet (R26.81);Muscle weakness (generalized) (M62.81);Other symptoms and signs involving the nervous system (R29.898)     Time: 1400-1435 PT Time Calculation (min) (ACUTE ONLY): 35 min  Charges:  $Therapeutic Activity: 23-37 mins                    Laurina Bustlearoline Mykaela Arena, PT, DPT Acute Rehabilitation Services  Pager: [phone]   Vanetta MuldersCarloine H Eldrick Penick 05/21/2018, 5:10 PM

## 2018-05-21 NOTE — Progress Notes (Signed)
Nurse Tech reports that pt was attempting to get out of bed; and NT attempted to stop him and pt preceded to kick and punch  both nurse techs Cammy Copa(Abigail and Delaney Meigsamara). Pt continues to be aggressive and difficult to re-direct. MD notified.

## 2018-05-21 NOTE — Progress Notes (Signed)
SLP Cancellation Note  Patient Details Name: Allie Dimmerurna Colden MRN: [id number] DOB: 01/15/1941   Cancelled treatment:       Reason Eval/Treat Not Completed: Patient at procedure or test/unavailable, having EEG set up in room at this time. Per RN, pt tolerates dys3/thin liquid diet and crushed meds. Will continue efforts.  Sonna Lipsky B. Murvin NatalBueche, [facility]MSP, CCC-SLP Speech Language Pathologist [phone]  [name], Ailis Rigaud Brown 05/21/2018, 10:58 AM

## 2018-05-21 NOTE — Progress Notes (Signed)
PT REMAINS COMBATIVE AT TIMES, HARD TO REDIRECT; COMPLIES WHEN FAMILY IS PRESENT. SWINGING AT STAFF THIS AFTERNOON DURING TREATMENT AND DURING PERSONAL CARE.

## 2018-05-21 NOTE — Progress Notes (Addendum)
PROGRESS NOTE        PATIENT DETAILS Name: [name] Age: 77 y.o. Sex: male Date of Birth: 08-19-1941 Admit Date: 05/19/2018 Admitting Physician Zannie Cove, MD ZOX:WRUEAVW, Dorma Russell, MD  Brief Narrative: Patient is a 77 y.o. 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  Subsequently admitted to the hospitalist service for further evaluation and treatment.  Subjective: Sleeping comfortably-Zyprexa started last night-Per RN no major events overnight.  Still continues to mumble-per daughter at bedside-his speech has deteriorated to this quality over the past few months.  Assessment/Plan: Worsening mental status: Likely delirium in the setting of underlying dementia (probably multi-infarct).  MRI of the brain did not show any acute CVA.  No recent change in medications.  No evidence of infection.  EEG without seizures.  HIV and RPR serology negative.  TSH within normal limits.  Vitamin B12 1 borderline low.  Neurology and psychiatry following.  Seroquel stopped yesterday, started on Zyprexa which he seems to be tolerating well.  Per RN no major agitation issues overnight.  Hypertension: Blood pressure controlled, currently off all antihypertensives.  DM-2: CBG stable with SSI, continue to hold metformin.  History of multiple CVA's: MRI brain this admission negative for acute CVA, continue antiplatelets, statin.   DVT Prophylaxis: Prophylactic Lovenox   Code Status: Full code  Family Communication: Long discussion with patient's daughter at bedside and with case manager at bedside.  Trying to figure out a way to get maximum home health services.  Disposition Plan: Remain inpatient-but will  plan on Home health once mental status is somewhat better and close to usual baseline  Antimicrobial agents: Anti-infectives (From admission, onward)   None      Procedures: None  CONSULTS:  neurology and psychiatry  Time spent: 25  minutes-Greater than 50% of this time was spent in counseling, explanation of diagnosis, planning of further management, and coordination of care.  MEDICATIONS: Scheduled Meds: . aspirin  300 mg Rectal Daily   Or  . aspirin  325 mg Oral Daily  . atorvastatin  80 mg Oral q1800  . clopidogrel  75 mg Oral Daily  . cyanocobalamin  1,000 mcg Intramuscular Daily  . enoxaparin (LOVENOX) injection  40 mg Subcutaneous Q24H  . folic acid  1 mg Oral Daily  . insulin aspart  0-9 Units Subcutaneous TID WC  . loratadine  10 mg Oral Daily  . OLANZapine zydis  2.5 mg Oral QHS  . sertraline  100 mg Oral Daily  . [START ON 05/22/2018] thiamine  100 mg Oral Daily  . traZODone  100 mg Oral QHS   Continuous Infusions: . thiamine injection 500 mg (05/20/18 1435)   PRN Meds:.acetaminophen **OR** acetaminophen (TYLENOL) oral liquid 160 mg/5 mL **OR** acetaminophen, albuterol, OLANZapine zydis, senna-docusate   PHYSICAL EXAM: Vital signs: Vitals:   05/20/18 1528 05/20/18 1915 05/20/18 2337 05/21/18 0820  BP: (!) 144/88 (!) 148/76 126/74 137/89  Pulse: 79 88 77 79  Resp: 15 (!) 22 16 19   Temp: 97.8 F (36.6 C)  98 F (36.7 C) 98.4 F (36.9 C)  TempSrc: Oral  Oral Oral  SpO2: 94% 99% 100% 100%   There  were no vitals filed for this visit. There is no height or weight on file to calculate BMI.   General appearance: Awake, confused.  Mumbles. Eyes:no scleral icterus. HEENT: Atraumatic and Normocephalic Neck: supple, no JVD. Resp:Good air entry bilaterally,no rales or rhonchi CVS: S1 S2 regular, no murmurs.  GI: Bowel sounds present, Non tender and not distended with no gaurding, rigidity or rebound. Extremities: B/L Lower Ext shows no edema, both legs are  warm to touch Neurology: Moving all 4 extremities unable to evaluate further given mental status. Psychiatric: Normal judgment and insight. Normal mood. Musculoskeletal:No digital cyanosis Skin:No Rash, warm and dry Wounds:N/A  I have personally reviewed following labs and imaging studies  LABORATORY DATA: CBC: Recent Labs  Lab 05/19/18 0854  WBC 10.3  NEUTROABS 7.5  HGB 11.9*  HCT 35.6*  MCV 93.4  PLT 144*    Basic Metabolic Panel: Recent Labs  Lab 05/19/18 0854  NA 139  K 4.0  CL 100  CO2 25  GLUCOSE 133*  BUN 13  CREATININE 1.16  CALCIUM 9.6    GFR: CrCl cannot be calculated (Unknown ideal weight.).  Liver Function Tests: Recent Labs  Lab 05/19/18 0854  AST 29  ALT 18  ALKPHOS 63  BILITOT 1.4*  PROT 7.1  ALBUMIN 4.0   No results for input(s): LIPASE, AMYLASE in the last 168 hours. Recent Labs  Lab 05/19/18 0905  AMMONIA 23    Coagulation Profile: Recent Labs  Lab 05/19/18 0854  INR 1.02    Cardiac Enzymes: No results for input(s): CKTOTAL, CKMB, CKMBINDEX, TROPONINI in the last 168 hours.  BNP (last 3 results) No results for input(s): PROBNP in the last 8760 hours.  HbA1C: Recent Labs    05/20/18 0529  HGBA1C 5.3    CBG: Recent Labs  Lab 05/20/18 1138 05/20/18 1712 05/20/18 2208 05/21/18 0623 05/21/18 1151  GLUCAP 91 84 105* 88 85    Lipid Profile: Recent Labs    05/20/18 0529  CHOL 102  HDL 46  LDLCALC 42  TRIG 68  CHOLHDL 2.2    Thyroid Function Tests: Recent Labs    05/20/18 1005  TSH 6.624*    Anemia Panel: Recent Labs    05/20/18 1005  VITAMINB12 354    Urine analysis:    Component Value Date/Time   COLORURINE YELLOW 05/19/2018 0854   APPEARANCEUR CLEAR 05/19/2018 0854   LABSPEC 1.014 05/19/2018 0854   PHURINE 5.0 05/19/2018 0854   GLUCOSEU NEGATIVE 05/19/2018 0854   HGBUR NEGATIVE 05/19/2018 0854   HGBUR negative 08/28/2010 0959   BILIRUBINUR NEGATIVE 05/19/2018 0854   KETONESUR  NEGATIVE 05/19/2018 0854   PROTEINUR NEGATIVE 05/19/2018 0854   UROBILINOGEN 1.0 02/20/2012 1944   NITRITE NEGATIVE 05/19/2018 0854   LEUKOCYTESUR NEGATIVE 05/19/2018 0854    Sepsis Labs: Lactic Acid, Venous    Component Value Date/Time   LATICACIDVEN 1.37 05/19/2018 1106    MICROBIOLOGY: Recent Results (from the past 240 hour(s))  Urine culture     Status: None   Collection Time: 05/19/18  8:56 AM  Result Value Ref Range Status   Specimen Description URINE, CATHETERIZED  Final   Special Requests NONE  Final   Culture   Final    NO GROWTH Performed at [facility], [address], [place], [place] [address]    Report Status 05/20/2018 FINAL  Final    RADIOLOGY STUDIES/RESULTS: Dg Chest 2 View  Result Date: 05/19/2018 CLINICAL DATA:  Altered mental status EXAM: CHEST - 2  VIEW COMPARISON:  08/01/2017 FINDINGS: The heart size and mediastinal contours are within normal limits. Both lungs are clear. The visualized skeletal structures are unremarkable. IMPRESSION: No active cardiopulmonary disease. Electronically Signed   By: Alcide CleverMark  Lukens M.D.   On: 05/19/2018 09:51   Ct Head Wo Contrast  Result Date: 05/19/2018 CLINICAL DATA:  Previous stroke.  Mental status changes. EXAM: CT HEAD WITHOUT CONTRAST TECHNIQUE: Contiguous axial images were obtained from the base of the skull through the vertex without intravenous contrast. COMPARISON:  08/03/2017 FINDINGS: Brain: Generalized atrophy. Extensive chronic small-vessel ischemic changes throughout the cerebral hemispheric white matter. Old left MCA territory stroke affecting the temporal lobe, insula and frontoparietal brain. This has progressed to atrophy and encephalomalacia. No CT evidence of acute infarction, mass lesion, hemorrhage, hydrocephalus or extra-axial collection. Vascular: There is atherosclerotic calcification of the major vessels at the base of the brain. Skull: Negative Sinuses/Orbits: Clear/normal Other: None IMPRESSION:  Old left MCA territory infarction with atrophy and encephalomalacia. Advanced chronic small-vessel ischemic change throughout the hemispheric white matter. No sign of acute or subacute lesion. These results were communicated to Dr. Laurence SlateAroor At 10:13 amon 8/26/2019by text page via the [facility] messaging system. Electronically Signed   By: Paulina FusiMark  Shogry M.D.   On: 05/19/2018 10:14   Mr Brain Wo Contrast  Result Date: 05/19/2018 CLINICAL DATA:  Altered mental status EXAM: MRI HEAD WITHOUT CONTRAST TECHNIQUE: Multiplanar, multiecho pulse sequences of the brain and surrounding structures were obtained without intravenous contrast. COMPARISON:  Head CT 05/19/2018 FINDINGS: The examination had to be discontinued prior to completion due to patient altered mental status and inability to follow the technologist's instructions. Axial diffusion-weighted imaging, T2-weighted imaging and FLAIR sequences were obtained. There is no abnormal diffusion restriction to indicate acute infarct. There is no midline shift or other mass effect. There is generalized volume loss and diffuse confluent white matter hyperintense T2-weighted signal. There is encephalomalacia in the posterior left MCA distribution at the site prior infarct. Paranasal sinuses are clear. Normal orbits. IMPRESSION: 1. Truncated examination without acute abnormality. 2. Old left MCA territory infarct with advanced chronic ischemic microangiopathy and parenchymal atrophy. Electronically Signed   By: Deatra RobinsonKevin  Herman M.D.   On: 05/19/2018 17:56     LOS: 2 days   Jeoffrey MassedShanker Kalina Morabito, MD  [facility]  If 7PM-7AM, please contact night-coverage  Please page via [email or web address] [email or web address]-click on MD name and type text message  05/21/2018, 12:31 PM

## 2018-05-21 NOTE — Progress Notes (Signed)
Pt observed to be agitated, refused his night medications, swinging at staff when attempted to do his CBG, pt was left alone at this time, will however continue to monitor. Obasogie-Asidi, Amia Rynders Efe

## 2018-05-21 NOTE — Progress Notes (Signed)
CSW acknowledging consult for SNF placement. However, plan is for patient to return home. No SNF placement needed at this time.   CSW signing off.  Blenda NicelyElizabeth Addaline Peplinski, [place] Clinical Social Worker [phone]

## 2018-05-21 NOTE — Progress Notes (Addendum)
CM consulted for assistance with patient d/cing home and having 24/7 supervision. Pt will also require Vit B shots daily for a week then a taper of the shots.  CM provided the daughter Choice of HH agencies and she selected [facility]. Lupita [facility]Donna [facility] about having the Vit B shots at [facility]. Awaiting response.  Daughter goes to work at 5:30 am and gets home around 5 pm. Patients other daughter gets to the home about 8 am and stays until 5 pm.  The patient has an Engineer, productionaide [facility] that is there daily from 9 am until 3 pm. Pt alone in the am from 5:30 until 8 am. CM called [facility] to see if the aide services can be changed to start at 5 am and end at 11 am to cover the time the patient is alone. They are going to check and see if this is possible and notify CM.   1440: CM spoke to Tammy the CM for the patients [employer] program who organizes the aide services and she is able to increase the amount of aide services in the home. She is able to arrange for aides to be in the home from 5 am until 1 pm Monday-Friday. The family will have to provide coverage after 1 pm until the daughter he lives with gets home at 5 pm. CM called the daughter he lives with and notified her that the family will need to provide supervision from 1 pm until 5 pm. She is going to speak with her sister and call CM back.  [facility] unable to provide the vit B shots. CM spoke to MilroyEllen at [facility] and they are able to provide a RN for the daily shots and then the taper. MD updated.

## 2018-05-21 NOTE — Progress Notes (Addendum)
Subjective: Wife is in the room at this time.  Patient initially sleeping.  With light touch she does wake up.  Although wife is translating for me patient does not want to follow any commands.  Stares forward.  The only part of the exam I could get was blink to threat.  He is much less agitated today.  Wife states that he did eat a little bit today but does not appear to want to eat.  Exam: Vitals:   05/20/18 2337 05/21/18 0820  BP: 126/74 137/89  Pulse: 77 79  Resp: 16 19  Temp: 98 F (36.7 C) 98.4 F (36.9 C)  SpO2: 100% 100%    Physical Exam   HEENT-  Normocephalic, no lesions, without obvious abnormality.  Normal external eye and conjunctiva.   Extremities- Warm, dry and intact Musculoskeletal-no joint tenderness, deformity or swelling Skin-warm and dry, no hyperpigmentation, vitiligo, or suspicious lesions    Neuro:  Mental Status: Awakens easily with light touch.  As noted above stares forward and follows no commands even with help of translation from wife.  Will not even tell me his wife's name and is nonverbal.  Is frustrated with noxious stimuli in all 4 quadrants.  At this time he appears either sleepy or depressed. Cranial Nerves: II: Appears to blink to threat bilaterally III,IV, VI: Clenches eyelid shut however with strength I was able to look at his pupils which are equal and reactive at 2 mm V,VII: Face is symmetrical and winces to noxious stimuli bilaterally VIII: Mumbled once when wife was talking to him  Motor: Draws to noxious stimuli 5/5 in the upper extremities and 4/5 lower extremities Sensory: As noted he withdraws from noxious stimuli Deep Tendon Reflexes: 2+ and symmetric throughout Plantars: Right: downgoing   Left: downgoing Cerebellar: Not able to obtain as patient does not want to take part in any of the exam Gait: Not tested    Medications:  Scheduled: . aspirin  300 mg Rectal Daily   Or  . aspirin  325 mg Oral Daily  . atorvastatin  80  mg Oral q1800  . clopidogrel  75 mg Oral Daily  . cyanocobalamin  1,000 mcg Intramuscular Daily  . enoxaparin (LOVENOX) injection  40 mg Subcutaneous Q24H  . folic acid  1 mg Oral Daily  . insulin aspart  0-9 Units Subcutaneous TID WC  . loratadine  10 mg Oral Daily  . OLANZapine zydis  2.5 mg Oral QHS  . sertraline  100 mg Oral Daily  . [START ON 05/22/2018] thiamine  100 mg Oral Daily  . traZODone  100 mg Oral QHS   Continuous: . thiamine injection 500 mg (05/20/18 1435)   ZOX:[id number]:acetaminophen **OR** acetaminophen (TYLENOL) oral liquid 160 mg/5 mL **OR** acetaminophen, albuterol, LORazepam, OLANZapine zydis, senna-docusate  Pertinent Labs/Diagnostics: TSH,-6.64 B12,-354 goal would be greater than 600 RPR,-nonreactive HIV-nonreactive EEG reading pending  Felicie MornDavid Smith PA-C [facility] [phone]   Assessment: 77 year old male likely presenting with combination of delirium superimposed on dementia.  Has been seen by psychology who has recommended Zyprexa 2.5 mg nightly and 2.5 mg daily as needed for insomnia, poor appetite and agitation.  Also recommend to discontinue Seroquel and continue trazodone 100 mg nightly for insomnia however can switch to as needed if patient becomes too sedated.  They have also recommended to continue Zoloft 100 mg daily   Recommendations: #EEG : no epileptiform activity #Continue thiamine #Zyprexa, discontinue Seroquel per psych recommendations   05/21/2018, 9:10 AM   NEUROHOSPITALIST  ADDENDUM Performed a face to face diagnostic evaluation.  Impression: Delirium Key exam findings:patient sitting up in bed, eating lunch. Passed swallow eval. Plan: No further neurological workup needed.F/U psych recommendations. Added B12 due to borderline low level.   I have reviewed the contents of history and physical exam as documented by PA/ARNP/Resident and agree with above documentation.  I have discussed and formulated the above plan as  documented. Edits to the note have been made as needed.    Georgiana Spinner Trestan Vahle MD [facility] [phone]   If 7pm to 7am, please call on call as listed on AMION.

## 2018-05-22 DIAGNOSIS — F0151 Vascular dementia with behavioral disturbance: Secondary | ICD-10-CM | POA: Diagnosis not present

## 2018-05-22 DIAGNOSIS — I10 Essential (primary) hypertension: Secondary | ICD-10-CM | POA: Diagnosis not present

## 2018-05-22 DIAGNOSIS — R4182 Altered mental status, unspecified: Secondary | ICD-10-CM | POA: Diagnosis not present

## 2018-05-22 DIAGNOSIS — E1149 Type 2 diabetes mellitus with other diabetic neurological complication: Secondary | ICD-10-CM | POA: Diagnosis not present

## 2018-05-22 LAB — GLUCOSE, CAPILLARY
Glucose-Capillary: 103 mg/dL — ABNORMAL HIGH (ref 70–99)
Glucose-Capillary: 102 mg/dL — ABNORMAL HIGH (ref 70–99)

## 2018-05-22 MED ORDER — OLANZAPINE 5 MG PO TBDP: 5.0000 mg | ORAL_TABLET | Freq: Every day | ORAL | Status: DC

## 2018-05-22 MED ORDER — OLANZAPINE 5 MG PO TBDP: 5.0000 mg | ORAL_TABLET | Freq: Every day | ORAL | 0 refills | Status: DC

## 2018-05-22 MED ORDER — CYANOCOBALAMIN 1000 MCG/ML IJ SOLN: 1000.0000 ug | Freq: Once | INTRAMUSCULAR | 0 refills | Status: AC

## 2018-05-22 MED ORDER — THIAMINE HCL 100 MG PO TABS: 100.0000 mg | ORAL_TABLET | Freq: Every day | ORAL | 0 refills | Status: DC

## 2018-05-22 MED ORDER — AMLODIPINE BESYLATE 2.5 MG PO TABS: 2.5000 mg | ORAL_TABLET | Freq: Every day | ORAL | 0 refills | Status: DC

## 2018-05-22 MED ORDER — OLANZAPINE 5 MG PO TBDP: 2.5000 mg | ORAL_TABLET | Freq: Every day | ORAL | 0 refills | Status: DC | PRN

## 2018-05-22 NOTE — Discharge Summary (Signed)
PATIENT DETAILS Name: [name] Age: 77 y.o. Sex: male Date of Birth: 1941/02/09 MRN: [id number]. Admitting Physician: Zannie Cove, MD JXB:[id number], Dorma Russell, MD  Admit Date: 05/19/2018 Discharge date: 05/22/2018  Recommendations for Outpatient Follow-up:  1. Follow up with PCP in 1-2 weeks 2. Please obtain BMP/CBC in one week 3. Please ensure follow up with psychiatry  Admitted From:  Home  Disposition: Home with home health services   Home Health:  Yes  Equipment/Devices:  None  Discharge Condition: Stable  CODE STATUS: FULL CODE  Diet recommendation:  Heart Healthy / Carb Modified  Brief Summary: See H&P, Labs, Consult and Test reports for all details in brief, Patient is a 77 y.o. 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  Subsequently admitted to the hospitalist service for further evaluation and treatment.  Brief Hospital Course: Worsening mental status: Likely delirium in the setting of underlying dementia (probably multi-infarct).  MRI of the brain did not show any acute CVA.  No recent change in medications.  No evidence of infection.  EEG without seizures.  HIV and RPR serology negative.  TSH only minimally elevated.  Vitamin B12 borderline low.  Neurology and psychiatry consulted during this hospital stay. Seroquel stopped started on Zyprexa-dose increased to 5 mg today.  Have started patient on vitamin B12 supplementation as well.  Continues to have some issues with delirium overnight-Long discussion with patient's daughter at bedside yesterday-difficult situation with really no good solutions.  Family wants to take patient home, subsequently spoke with case manager-maximum home health  services arranged-home aide will be with patient from 5 AM to 1 PM, following which family will then provide supervision.  Since he now has 24/7 supervision-and since family wants to take him home-I think he is as stable as one could be with advanced dementia and severe delirium.  He best benefits from being in familiar surroundings surrounded by family.  Family knows that this may be a irreversible situation-and if he significantly deteriorates in the future-may be worth initiating palliative care measures then  Hypertension: Blood pressure was controlled but now has started to creep up-we will switch to low-dose amlodipine on discharge.    DM-2: CBG stable with SSI, resume metformin on discharge  History of multiple CVA's: MRI brain this admission negative for acute CVA, continue antiplatelets, statin.   Procedures/Studies: None  Discharge Diagnoses:  Principal Problem:   Altered mental state Active Problems:   HYPERTENSION, BENIGN ESSENTIAL   Acute CVA / Lt MCA 08/01/2017, Rt MCA 04/02/2017   DM (diabetes mellitus) (HCC)   Aphasia   Discharge Instructions:  Activity:  As tolerated with Full fall precautions use walker/cane & assistance as needed   Discharge Instructions    Diet - low sodium heart healthy   Complete by:  As directed    Discharge instructions   Complete by:  As directed    Follow with Primary MD  Fleet Contras, MD in 1 week  You need Vitamin B12 inject daily for 1 week from 8/28, then once weekly for 4 weeks, and then once monthly  Please get a complete blood count and chemistry panel checked by your Primary MD at your next visit, and again as instructed by your Primary MD.  Get Medicines reviewed and adjusted: Please take all your  medications with you for your next visit with your Primary MD  Laboratory/radiological data: Please request your Primary MD to go over all hospital tests and procedure/radiological results at the follow up, please ask your  Primary MD to get all Hospital records sent to his/her office.  In some cases, they will be blood work, cultures and biopsy results pending at the time of your discharge. Please request that your primary care M.D. follows up on these results.  Also Note the following: If you experience worsening of your admission symptoms, develop shortness of breath, life threatening emergency, suicidal or homicidal thoughts you must seek medical attention immediately by calling 911 or calling your MD immediately  if symptoms less severe.  You must read complete instructions/literature along with all the possible adverse reactions/side effects for all the Medicines you take and that have been prescribed to you. Take any new Medicines after you have completely understood and accpet all the possible adverse reactions/side effects.   Do not drive when taking Pain medications or sleeping medications (Benzodaizepines)  Do not take more than prescribed Pain, Sleep and Anxiety Medications. It is not advisable to combine anxiety,sleep and pain medications without talking with your primary care practitioner  Special Instructions: If you have smoked or chewed Tobacco  in the last 2 yrs please stop smoking, stop any regular Alcohol  and or any Recreational drug use.  Wear Seat belts while driving.  Please note: You were cared for by a hospitalist during your hospital stay. Once you are discharged, your primary care physician will handle any further medical issues. Please note that NO REFILLS for any discharge medications will be authorized once you are discharged, as it is imperative that you return to your primary care physician (or establish a relationship with a primary care physician if you do not have one) for your post hospital discharge needs so that they can reassess your need for medications and monitor your lab values.   Increase activity slowly   Complete by:  As directed      Allergies as of 05/22/2018   No  Known Allergies     Medication List    STOP taking these medications   citalopram 20 MG tablet Commonly known as:  CELEXA   losartan-hydrochlorothiazide 50-12.5 MG tablet Commonly known as:  HYZAAR   QUEtiapine 25 MG tablet Commonly known as:  SEROQUEL     TAKE these medications   albuterol 108 (90 Base) MCG/ACT inhaler Commonly known as:  PROVENTIL HFA;VENTOLIN HFA Inhale 2 puffs into the lungs every 6 (six) hours as needed for wheezing or shortness of breath.   amLODipine 2.5 MG tablet Commonly known as:  NORVASC Take 1 tablet (2.5 mg total) by mouth daily.   aspirin 325 MG EC tablet Take 1 tablet (325 mg total) daily by mouth.   atorvastatin 80 MG tablet Commonly known as:  LIPITOR Take 1 tablet (80 mg total) by mouth daily at 6 PM.   clopidogrel 75 MG tablet Commonly known as:  PLAVIX Take 1 tablet (75 mg total) by mouth daily.   cyanocobalamin 1000 MCG/ML injection Commonly known as:  (VITAMIN B-12) Inject 1 mL (1,000 mcg total) into the muscle once for 1 dose. 1000 mcg IM daily 7 days from 8/28,then 1000 mcg IM once weekly x 4 weeks,then 1000 mcg IM once monthly .   folic acid 1 MG tablet Commonly known as:  FOLVITE Take 1 mg by mouth daily.   glucose blood test strip Use as  instructed   LINZESS 145 MCG Caps capsule Generic drug:  linaclotide Take 145 mcg by mouth daily before breakfast.   loratadine 10 MG tablet Commonly known as:  CLARITIN Take 10 mg by mouth daily.   meclizine 12.5 MG tablet Commonly known as:  ANTIVERT Take 12.5 mg by mouth 3 (three) times daily as needed for dizziness.   metFORMIN 500 MG tablet Commonly known as:  GLUCOPHAGE Take 500 mg by mouth 2 (two) times daily with a meal.   OLANZapine zydis 5 MG disintegrating tablet Commonly known as:  ZYPREXA Take 1 tablet (5 mg total) by mouth at bedtime.   OLANZapine zydis 5 MG disintegrating tablet Commonly known as:  ZYPREXA Take 0.5 tablets (2.5 mg total) by mouth daily as  needed (agitation).   onetouch ultrasoft lancets Check blood sugars twice a day   sertraline 100 MG tablet Commonly known as:  ZOLOFT Take 100 mg by mouth daily.   thiamine 100 MG tablet Take 1 tablet (100 mg total) by mouth daily.   traZODone 100 MG tablet Commonly known as:  DESYREL Take 100 mg by mouth at bedtime.   Vitamin D3 50000 units Caps Take 1 capsule by mouth once a week.      Follow-up Information    Fleet Contras, MD. Schedule an appointment as soon as possible for a visit in 1 week(s).   Specialty:  Internal Medicine Contact information: [address] [place] [address] [phone]          No Known Allergies  Consultations:   neurology and psychiatry  Other Procedures/Studies: Dg Chest 2 View  Result Date: 05/19/2018 CLINICAL DATA:  Altered mental status EXAM: CHEST - 2 VIEW COMPARISON:  08/01/2017 FINDINGS: The heart size and mediastinal contours are within normal limits. Both lungs are clear. The visualized skeletal structures are unremarkable. IMPRESSION: No active cardiopulmonary disease. Electronically Signed   By: Alcide Clever M.D.   On: 05/19/2018 09:51   Ct Head Wo Contrast  Result Date: 05/19/2018 CLINICAL DATA:  Previous stroke.  Mental status changes. EXAM: CT HEAD WITHOUT CONTRAST TECHNIQUE: Contiguous axial images were obtained from the base of the skull through the vertex without intravenous contrast. COMPARISON:  08/03/2017 FINDINGS: Brain: Generalized atrophy. Extensive chronic small-vessel ischemic changes throughout the cerebral hemispheric white matter. Old left MCA territory stroke affecting the temporal lobe, insula and frontoparietal brain. This has progressed to atrophy and encephalomalacia. No CT evidence of acute infarction, mass lesion, hemorrhage, hydrocephalus or extra-axial collection. Vascular: There is atherosclerotic calcification of the major vessels at the base of the brain. Skull: Negative Sinuses/Orbits:  Clear/normal Other: None IMPRESSION: Old left MCA territory infarction with atrophy and encephalomalacia. Advanced chronic small-vessel ischemic change throughout the hemispheric white matter. No sign of acute or subacute lesion. These results were communicated to Dr. Laurence Slate At 10:13 amon 8/26/2019by text page via the [facility] messaging system. Electronically Signed   By: Paulina Fusi M.D.   On: 05/19/2018 10:14   Mr Brain Wo Contrast  Result Date: 05/19/2018 CLINICAL DATA:  Altered mental status EXAM: MRI HEAD WITHOUT CONTRAST TECHNIQUE: Multiplanar, multiecho pulse sequences of the brain and surrounding structures were obtained without intravenous contrast. COMPARISON:  Head CT 05/19/2018 FINDINGS: The examination had to be discontinued prior to completion due to patient altered mental status and inability to follow the technologist's instructions. Axial diffusion-weighted imaging, T2-weighted imaging and FLAIR sequences were obtained. There is no abnormal diffusion restriction to indicate acute infarct. There is no midline shift or other  mass effect. There is generalized volume loss and diffuse confluent white matter hyperintense T2-weighted signal. There is encephalomalacia in the posterior left MCA distribution at the site prior infarct. Paranasal sinuses are clear. Normal orbits. IMPRESSION: 1. Truncated examination without acute abnormality. 2. Old left MCA territory infarct with advanced chronic ischemic microangiopathy and parenchymal atrophy. Electronically Signed   By: Deatra Robinson M.D.   On: 05/19/2018 17:56     TODAY-DAY OF DISCHARGE:  Subjective:   Kevaughn Harpenau today was sleeping when I walked into the room.  He awoke but was nonverbal.  He is confused and is not following commands but was calm and quiet when I evaluated him.  Objective:   Blood pressure 122/76, pulse 78, temperature 97.6 F (36.4 C), temperature source Axillary, resp. rate 18, SpO2 100 %. No intake or output data in the  24 hours ending 05/22/18 0903 There were no vitals filed for this visit.  Exam: [place].AT,PERRAL Supple Neck,No JVD, No cervical lymphadenopathy appriciated.  Symmetrical Chest wall movement, Good air movement bilaterally, CTAB RRR,No Gallops,Rubs or new Murmurs, No Parasternal Heave +ve B.Sounds, Abd Soft, Non tender, No organomegaly appriciated, No rebound -guarding or rigidity. No Cyanosis, Clubbing or edema, No new Rash or bruise   PERTINENT RADIOLOGIC STUDIES: Dg Chest 2 View  Result Date: 05/19/2018 CLINICAL DATA:  Altered mental status EXAM: CHEST - 2 VIEW COMPARISON:  08/01/2017 FINDINGS: The heart size and mediastinal contours are within normal limits. Both lungs are clear. The visualized skeletal structures are unremarkable. IMPRESSION: No active cardiopulmonary disease. Electronically Signed   By: Alcide Clever M.D.   On: 05/19/2018 09:51   Ct Head Wo Contrast  Result Date: 05/19/2018 CLINICAL DATA:  Previous stroke.  Mental status changes. EXAM: CT HEAD WITHOUT CONTRAST TECHNIQUE: Contiguous axial images were obtained from the base of the skull through the vertex without intravenous contrast. COMPARISON:  08/03/2017 FINDINGS: Brain: Generalized atrophy. Extensive chronic small-vessel ischemic changes throughout the cerebral hemispheric white matter. Old left MCA territory stroke affecting the temporal lobe, insula and frontoparietal brain. This has progressed to atrophy and encephalomalacia. No CT evidence of acute infarction, mass lesion, hemorrhage, hydrocephalus or extra-axial collection. Vascular: There is atherosclerotic calcification of the major vessels at the base of the brain. Skull: Negative Sinuses/Orbits: Clear/normal Other: None IMPRESSION: Old left MCA territory infarction with atrophy and encephalomalacia. Advanced chronic small-vessel ischemic change throughout the hemispheric white matter. No sign of acute or subacute lesion. These results were communicated to Dr. Laurence Slate At  10:13 amon 8/26/2019by text page via the [facility] messaging system. Electronically Signed   By: Paulina Fusi M.D.   On: 05/19/2018 10:14   Mr Brain Wo Contrast  Result Date: 05/19/2018 CLINICAL DATA:  Altered mental status EXAM: MRI HEAD WITHOUT CONTRAST TECHNIQUE: Multiplanar, multiecho pulse sequences of the brain and surrounding structures were obtained without intravenous contrast. COMPARISON:  Head CT 05/19/2018 FINDINGS: The examination had to be discontinued prior to completion due to patient altered mental status and inability to follow the technologist's instructions. Axial diffusion-weighted imaging, T2-weighted imaging and FLAIR sequences were obtained. There is no abnormal diffusion restriction to indicate acute infarct. There is no midline shift or other mass effect. There is generalized volume loss and diffuse confluent white matter hyperintense T2-weighted signal. There is encephalomalacia in the posterior left MCA distribution at the site prior infarct. Paranasal sinuses are clear. Normal orbits. IMPRESSION: 1. Truncated examination without acute abnormality. 2. Old left MCA territory infarct with advanced chronic ischemic microangiopathy and parenchymal  atrophy. Electronically Signed   By: Deatra RobinsonKevin  Herman M.D.   On: 05/19/2018 17:56     PERTINENT LAB RESULTS: CBC: No results for input(s): WBC, HGB, HCT, PLT in the last 72 hours. CMET CMP     Component Value Date/Time   NA 139 05/19/2018 0854   K 4.0 05/19/2018 0854   CL 100 05/19/2018 0854   CO2 25 05/19/2018 0854   GLUCOSE 133 (H) 05/19/2018 0854   BUN 13 05/19/2018 0854   CREATININE 1.16 05/19/2018 0854   CALCIUM 9.6 05/19/2018 0854   PROT 7.1 05/19/2018 0854   ALBUMIN 4.0 05/19/2018 0854   AST 29 05/19/2018 0854   ALT 18 05/19/2018 0854   ALKPHOS 63 05/19/2018 0854   BILITOT 1.4 (H) 05/19/2018 0854   GFRNONAA 59 (L) 05/19/2018 0854   GFRAA >60 05/19/2018 0854    GFR CrCl cannot be calculated (Unknown ideal  weight.). No results for input(s): LIPASE, AMYLASE in the last 72 hours. No results for input(s): CKTOTAL, CKMB, CKMBINDEX, TROPONINI in the last 72 hours. Invalid input(s): POCBNP No results for input(s): DDIMER in the last 72 hours. Recent Labs    05/20/18 0529  HGBA1C 5.3   Recent Labs    05/20/18 0529  CHOL 102  HDL 46  LDLCALC 42  TRIG 68  CHOLHDL 2.2   Recent Labs    05/20/18 1005  TSH 6.624*   Recent Labs    05/20/18 1005  VITAMINB12 354   Coags: No results for input(s): INR in the last 72 hours.  Invalid input(s): PT Microbiology: Recent Results (from the past 240 hour(s))  Urine culture     Status: None   Collection Time: 05/19/18  8:56 AM  Result Value Ref Range Status   Specimen Description URINE, CATHETERIZED  Final   Special Requests NONE  Final   Culture   Final    NO GROWTH Performed at [facility], [address] [place], [place] [address]    Report Status 05/20/2018 FINAL  Final    FURTHER DISCHARGE INSTRUCTIONS:  Get Medicines reviewed and adjusted: Please take all your medications with you for your next visit with your Primary MD  Laboratory/radiological data: Please request your Primary MD to go over all hospital tests and procedure/radiological results at the follow up, please ask your Primary MD to get all Hospital records sent to his/her office.  In some cases, they will be blood work, cultures and biopsy results pending at the time of your discharge. Please request that your primary care M.D. goes through all the records of your hospital data and follows up on these results.  Also Note the following: If you experience worsening of your admission symptoms, develop shortness of breath, life threatening emergency, suicidal or homicidal thoughts you must seek medical attention immediately by calling 911 or calling your MD immediately  if symptoms less severe.  You must read complete instructions/literature along with all the  possible adverse reactions/side effects for all the Medicines you take and that have been prescribed to you. Take any new Medicines after you have completely understood and accpet all the possible adverse reactions/side effects.   Do not drive when taking Pain medications or sleeping medications (Benzodaizepines)  Do not take more than prescribed Pain, Sleep and Anxiety Medications. It is not advisable to combine anxiety,sleep and pain medications without talking with your primary care practitioner  Special Instructions: If you have smoked or chewed Tobacco  in the last 2 yrs please stop smoking,  stop any regular Alcohol  and or any Recreational drug use.  Wear Seat belts while driving.  Please note: You were cared for by a hospitalist during your hospital stay. Once you are discharged, your primary care physician will handle any further medical issues. Please note that NO REFILLS for any discharge medications will be authorized once you are discharged, as it is imperative that you return to your primary care physician (or establish a relationship with a primary care physician if you do not have one) for your post hospital discharge needs so that they can reassess your need for medications and monitor your lab values.  Total Time spent coordinating discharge including counseling, education and face to face time equals 45 minutes.  SignedJeoffrey Massed 05/22/2018 9:03 AM

## 2018-05-22 NOTE — Progress Notes (Signed)
Patient declined morning medications this am but with the daughter persuasion RN able to administered IM B12 injection.   Sim BoastHavy, RN

## 2018-05-22 NOTE — Care Management Important Message (Signed)
Important Message  Patient Details  Name: [name] MRN: [id number] Date of Birth: 12/30/1940   Medicare Important Message Given:  Yes    Waverly Chavarria Stefan ChurchBratton 05/22/2018, 3:46 PM

## 2018-05-22 NOTE — Care Management Note (Signed)
Case Management Note  Patient Details  Name: [name] MRN: [id number] Date of Birth: 04/07/1941  Subjective/Objective:                    Action/Plan: [facility] CM notified [name] with [facility] of the d/c.  CM verified that pharmacy had Vit B12 in stock and that daughter would be able to pick up today for first RN visit tomorrow and daughter is aware med is ready. CM was able to find from Tammy with [facility] that patient will have aide services from 5am until 1pm Monday through Friday. The patients other daughter will provide supervision from 1 pm until 5 pm.  Daughter to provide supervision the other hours and today. She is providing transportation home.   Expected Discharge Date:  05/22/18               Expected Discharge Plan:  Home w Home Health Services  In-House Referral:     Discharge planning Services  CM Consult  Post Acute Care Choice:  Home Health Choice offered to:  Patient, Adult Children  DME Arranged:    DME Agency:     HH Arranged:  RN, PT, OT, Nurse's Aide, Speech Therapy, Social Work [facility] Agency:  [facility]  Status of Service:  Completed, signed off  If discussed at [employer] Meetings, dates discussed:    Additional Comments:  Kermit BaloKelli F Nuha Degner, RN 05/22/2018, 12:00 PM

## 2018-05-22 NOTE — Progress Notes (Signed)
NURSING PROGRESS NOTE  [name] [id number] Discharge Data: 05/22/2018 12:29 PM Attending Provider: Maretta Simon, Bradley M, MD [name], Bradley RussellEdwin, MD     Bradley DimmerPurna Simon to be D/C'd Home per MD order.  Discussed with the patient the After Visit Summary and all questions fully answered. All IV's discontinued with no bleeding noted. All belongings returned to patient for patient to take home. RX given and instructed daughter to have it fill at pharmacy.    Last Vital Signs:  Blood pressure 122/76, pulse 78, temperature 97.6 F (36.4 C), temperature source Axillary, resp. rate 18, SpO2 100 %.  Discharge Medication List Allergies as of 05/22/2018   No Known Allergies     Medication List    STOP taking these medications   citalopram 20 MG tablet Commonly known as:  CELEXA   losartan-hydrochlorothiazide 50-12.5 MG tablet Commonly known as:  HYZAAR   QUEtiapine 25 MG tablet Commonly known as:  SEROQUEL     TAKE these medications   albuterol 108 (90 Base) MCG/ACT inhaler Commonly known as:  PROVENTIL HFA;VENTOLIN HFA Inhale 2 puffs into the lungs every 6 (six) hours as needed for wheezing or shortness of breath.   amLODipine 2.5 MG tablet Commonly known as:  NORVASC Take 1 tablet (2.5 mg total) by mouth daily.   aspirin 325 MG EC tablet Take 1 tablet (325 mg total) daily by mouth.   atorvastatin 80 MG tablet Commonly known as:  LIPITOR Take 1 tablet (80 mg total) by mouth daily at 6 PM.   clopidogrel 75 MG tablet Commonly known as:  PLAVIX Take 1 tablet (75 mg total) by mouth daily.   cyanocobalamin 1000 MCG/ML injection Commonly known as:  (VITAMIN B-12) Inject 1 mL (1,000 mcg total) into the muscle once for 1 dose. 1000 mcg IM daily 7 days from 8/28,then 1000 mcg IM once weekly x 4 weeks,then 1000 mcg IM once monthly .   folic acid 1 MG tablet Commonly known as:  FOLVITE Take 1 mg by mouth daily.   glucose blood test strip Use as instructed   LINZESS 145 MCG Caps  capsule Generic drug:  linaclotide Take 145 mcg by mouth daily before breakfast.   loratadine 10 MG tablet Commonly known as:  CLARITIN Take 10 mg by mouth daily.   meclizine 12.5 MG tablet Commonly known as:  ANTIVERT Take 12.5 mg by mouth 3 (three) times daily as needed for dizziness.   metFORMIN 500 MG tablet Commonly known as:  GLUCOPHAGE Take 500 mg by mouth 2 (two) times daily with a meal.   OLANZapine zydis 5 MG disintegrating tablet Commonly known as:  ZYPREXA Take 1 tablet (5 mg total) by mouth at bedtime.   OLANZapine zydis 5 MG disintegrating tablet Commonly known as:  ZYPREXA Take 0.5 tablets (2.5 mg total) by mouth daily as needed (agitation).   onetouch ultrasoft lancets Check blood sugars twice a day   sertraline 100 MG tablet Commonly known as:  ZOLOFT Take 100 mg by mouth daily.   thiamine 100 MG tablet Take 1 tablet (100 mg total) by mouth daily.   traZODone 100 MG tablet Commonly known as:  DESYREL Take 100 mg by mouth at bedtime.   Vitamin D3 50000 units Caps Take 1 capsule by mouth once a week.

## 2018-05-22 NOTE — Progress Notes (Signed)
Daughter at bedside at this time witnessing pt declined med. Pt is awake but not interactive. Patient declined breakfast. RN asked daughter of his likes/dislikes and home food. Hot tea offered. CM consulted.   Sim BoastHavy, RN

## 2018-06-24 ENCOUNTER — Other Ambulatory Visit: Payer: Self-pay

## 2018-06-24 ENCOUNTER — Encounter: Payer: Medicare Other | Attending: Physical Medicine & Rehabilitation | Admitting: Physical Medicine & Rehabilitation

## 2018-06-24 ENCOUNTER — Encounter: Payer: Self-pay | Admitting: Physical Medicine & Rehabilitation

## 2018-06-24 VITALS — BP 129/82 | HR 75 | Ht 63.0 in | Wt 126.0 lb

## 2018-06-24 DIAGNOSIS — F01518 Vascular dementia, unspecified severity, with other behavioral disturbance: Secondary | ICD-10-CM

## 2018-06-24 DIAGNOSIS — F329 Major depressive disorder, single episode, unspecified: Secondary | ICD-10-CM | POA: Diagnosis not present

## 2018-06-24 DIAGNOSIS — I63512 Cerebral infarction due to unspecified occlusion or stenosis of left middle cerebral artery: Secondary | ICD-10-CM | POA: Diagnosis not present

## 2018-06-24 DIAGNOSIS — F0151 Vascular dementia with behavioral disturbance: Secondary | ICD-10-CM | POA: Diagnosis not present

## 2018-06-24 DIAGNOSIS — Z8673 Personal history of transient ischemic attack (TIA), and cerebral infarction without residual deficits: Secondary | ICD-10-CM | POA: Insufficient documentation

## 2018-06-24 DIAGNOSIS — F0390 Unspecified dementia without behavioral disturbance: Secondary | ICD-10-CM | POA: Insufficient documentation

## 2018-06-24 DIAGNOSIS — I69320 Aphasia following cerebral infarction: Secondary | ICD-10-CM | POA: Diagnosis not present

## 2018-06-24 DIAGNOSIS — I10 Essential (primary) hypertension: Secondary | ICD-10-CM | POA: Insufficient documentation

## 2018-06-24 DIAGNOSIS — G8194 Hemiplegia, unspecified affecting left nondominant side: Secondary | ICD-10-CM | POA: Diagnosis not present

## 2018-06-24 DIAGNOSIS — E119 Type 2 diabetes mellitus without complications: Secondary | ICD-10-CM | POA: Diagnosis not present

## 2018-06-24 NOTE — Patient Instructions (Addendum)
HE WILL REQUIRE MORE AND MORE CARE AS HIS DEMENTIA PROGRESSES. YOU WILL NEED MORE HELP TO MANAGE HIM AT HOME. EITHER MORE FAMILY WILL HAVE TO HELP OR YOU MAY ULTIMATELY NEED TO CONSIDER PLACING HIM IN A FACILITY WHICH CAN MANAGE PATIENTS WITH DEMENTIA          FOLLOW UP WITH YOUR PSYCHIATRIST TO SEE IF MEDICATIONS CAN BE ADJUSTED TO DECREASE HIS AGITATION.         PLEASE FEEL FREE TO CALL OUR OFFICE WITH ANY PROBLEMS OR QUESTIONS [phone])

## 2018-06-24 NOTE — Progress Notes (Signed)
Subjective:    Patient ID: [name], male    DOB: 10/18/1940, 77 y.o.   MRN: [id number]  HPI   This is a follow-up visit for [name].  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  He is been back in the hospital again with altered mental status, most recently on August 26.  It was felt the changes related to advancing dementia.  No new strokes were identified on imaging although advanced encephalomalacia is evident on MRI.Marland Kitchen  Patient was discharged home with family and 24-hour supervision..2 daughters provide most of his supervision and they are becoming more and more overwhelmed. Patient refuses care usually. He's incontinent, impulsive etc.   Pain Inventory Average Pain family member states don't know because he doesn't talk and do not show any signs of pain Pain Right Now family member states don't know because he doesn't talk and do not show any signs of pain My pain is family member states don't know because he doesn't talk and do not show any signs of pain  In the last 24 hours, has pain interfered with the following? General activity family member states don't know because he doesn't talk and do not show any signs of pain Relation with others family member states don't know because he doesn't talk and do not show any signs of pain Enjoyment of life family member states don't know because he doesn't talk and do not show any signs of pain What TIME of day is your pain at its worst? family member states don't know because he doesn't talk and do not show any signs of pain Sleep (in general) Poor  Pain is worse with: family member states don't know because he doesn't talk and do not show any signs of pain Pain improves with: family member states don't know because he doesn't talk and do not show any signs of pain Relief from Meds: family member states don't know because he doesn't talk and do not show any signs of pain  Mobility walk with assistance use a  wheelchair needs help with transfers  Function disabled: date disabled n/a I need assistance with the following:  feeding, dressing, bathing, toileting, meal prep, household duties and shopping  Neuro/Psych No problems in this area  Prior Studies Any changes since last visit?  no  Physicians involved in your care Any changes since last visit?  no   Family History  Problem Relation Age of Onset  . Diabetes Neg Hx   . Stroke Neg Hx   . Cancer Neg Hx    Social History   Socioeconomic History  . Marital status: Widowed    Spouse name: Not on file  . Number of children: Not on file  . Years of education: Not on file  . Highest education level: Not on file  Occupational History  . Not on file  Social Needs  . Financial resource strain: Not on file  . Food insecurity:    Worry: Not on file    Inability: Not on file  . Transportation needs:    Medical: Not on file    Non-medical: Not on file  Tobacco Use  . Smoking status: Never Smoker  . Smokeless tobacco: Never Used  Substance and Sexual Activity  . Alcohol use: No  . Drug use: No  . Sexual activity: Not on file  Lifestyle  . Physical activity:    Days per week: Not on file    Minutes per session:  Not on file  . Stress: Not on file  Relationships  . Social connections:    Talks on phone: Not on file    Gets together: Not on file    Attends religious service: Not on file    Active member of club or organization: Not on file    Attends meetings of clubs or organizations: Not on file    Relationship status: Not on file  Other Topics Concern  . Not on file  Social History Narrative   Lives in apt with 2 daughters who care for him.  He drinks tea in am.     Past Surgical History:  Procedure Laterality Date  . LAPAROSCOPIC APPENDECTOMY  02/21/2012   Procedure: APPENDECTOMY LAPAROSCOPIC;  Surgeon: Cherylynn Ridges, MD;  Location: [facility] OR;  Service: General;  Laterality: N/A;   Past Medical History:  Diagnosis Date   . Diabetes (HCC)   . Dizziness   . Hypertension   . Stroke (HCC)    x 2, last Nov 2018   BP 129/82   Pulse 75   Ht 5\' 3"  (1.6 m) Comment: family member reported, in wheelchair  Wt 126 lb (57.2 kg) Comment: family member reported, in wheelchair  SpO2 96%   BMI 22.32 kg/m    Opioid Risk Score:   Fall Risk Score:  `1  Depression screen PHQ 2/9  Depression screen [facility] 2/9 06/24/2018 04/16/2017  Decreased Interest 1 0  Down, Depressed, Hopeless 1 0  PHQ - 2 Score 2 0    Review of Systems  Constitutional: Positive for unexpected weight change.  HENT: Negative.   Eyes: Negative.   Respiratory: Positive for cough.   Gastrointestinal: Negative.   Endocrine: Negative.   Genitourinary: Negative.   Musculoskeletal: Negative.   Skin: Negative.   Allergic/Immunologic: Negative.   Neurological: Negative.   Hematological: Negative.   Psychiatric/Behavioral: Negative.   All other systems reviewed and are negative.      Objective:   Physical Exam General: No acute distress HEENT: EOMI, oral membranes moist Cards: reg rate  Chest: normal effort Abdomen: Soft, NT, ND Skin: dry, intact Extremities: no edema Musculoskeletal: He exhibits no edemaor tenderness.  Neurological: He is alert.  Flat affect. Does make eye contact. Is non-verbal. Initiates little does move all 4 limbs when he wants to.   Skin: Skin is warmand dry.  Psychiatric: remains flat and withdrawn     Assessment & Plan:  1. 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  Suffered an additional left MCA infarct in November 2019 with ongoing language and cognitive deficits -worsening dementia, requiring more and more care  -may need placement eventually but family support is fairly strong    -reviewed MRI with daughter with help of interpreter 2. Depression/dementia: per psychiatry,   -trial of risperdal for agitation??  -pt's agitation makes him difficult to  control 3.Visual acuity---stable 4. DM per primary   15 minutesof face to face patient care time were spent during this visit. All questions were encouraged and answered. Interpreter present to help with communication with patient and family.    Follow up with me prn

## 2018-08-02 ENCOUNTER — Other Ambulatory Visit: Payer: Self-pay | Admitting: Physical Medicine & Rehabilitation

## 2018-08-25 ENCOUNTER — Emergency Department (HOSPITAL_COMMUNITY): Payer: Medicare Other

## 2018-08-25 ENCOUNTER — Inpatient Hospital Stay (HOSPITAL_COMMUNITY)
Admission: EM | Admit: 2018-08-25 | Discharge: 2018-08-29 | DRG: 057 | Disposition: A | Discharge status: Home Health | Payer: Medicare Other | Attending: Internal Medicine | Admitting: Internal Medicine

## 2018-08-25 ENCOUNTER — Encounter (HOSPITAL_COMMUNITY): Payer: Self-pay | Admitting: *Deleted

## 2018-08-25 ENCOUNTER — Other Ambulatory Visit: Payer: Self-pay

## 2018-08-25 DIAGNOSIS — F05 Delirium due to known physiological condition: Secondary | ICD-10-CM | POA: Diagnosis present

## 2018-08-25 DIAGNOSIS — I160 Hypertensive urgency: Secondary | ICD-10-CM | POA: Diagnosis not present

## 2018-08-25 DIAGNOSIS — I639 Cerebral infarction, unspecified: Secondary | ICD-10-CM | POA: Diagnosis present

## 2018-08-25 DIAGNOSIS — E876 Hypokalemia: Secondary | ICD-10-CM

## 2018-08-25 DIAGNOSIS — I69398 Other sequelae of cerebral infarction: Principal | ICD-10-CM

## 2018-08-25 DIAGNOSIS — Z515 Encounter for palliative care: Secondary | ICD-10-CM | POA: Diagnosis not present

## 2018-08-25 DIAGNOSIS — Z66 Do not resuscitate: Secondary | ICD-10-CM | POA: Diagnosis not present

## 2018-08-25 DIAGNOSIS — G9389 Other specified disorders of brain: Secondary | ICD-10-CM | POA: Diagnosis present

## 2018-08-25 DIAGNOSIS — F0151 Vascular dementia with behavioral disturbance: Secondary | ICD-10-CM | POA: Diagnosis not present

## 2018-08-25 DIAGNOSIS — G4700 Insomnia, unspecified: Secondary | ICD-10-CM | POA: Diagnosis not present

## 2018-08-25 DIAGNOSIS — R32 Unspecified urinary incontinence: Secondary | ICD-10-CM | POA: Diagnosis present

## 2018-08-25 DIAGNOSIS — Z79899 Other long term (current) drug therapy: Secondary | ICD-10-CM

## 2018-08-25 DIAGNOSIS — R4182 Altered mental status, unspecified: Secondary | ICD-10-CM | POA: Diagnosis present

## 2018-08-25 DIAGNOSIS — I10 Essential (primary) hypertension: Secondary | ICD-10-CM | POA: Diagnosis present

## 2018-08-25 DIAGNOSIS — Z781 Physical restraint status: Secondary | ICD-10-CM

## 2018-08-25 DIAGNOSIS — E785 Hyperlipidemia, unspecified: Secondary | ICD-10-CM | POA: Diagnosis not present

## 2018-08-25 DIAGNOSIS — E109 Type 1 diabetes mellitus without complications: Secondary | ICD-10-CM | POA: Diagnosis present

## 2018-08-25 DIAGNOSIS — R159 Full incontinence of feces: Secondary | ICD-10-CM | POA: Diagnosis present

## 2018-08-25 DIAGNOSIS — Z7189 Other specified counseling: Secondary | ICD-10-CM

## 2018-08-25 DIAGNOSIS — R410 Disorientation, unspecified: Secondary | ICD-10-CM

## 2018-08-25 DIAGNOSIS — Z7902 Long term (current) use of antithrombotics/antiplatelets: Secondary | ICD-10-CM

## 2018-08-25 DIAGNOSIS — R451 Restlessness and agitation: Secondary | ICD-10-CM | POA: Diagnosis present

## 2018-08-25 DIAGNOSIS — Z7984 Long term (current) use of oral hypoglycemic drugs: Secondary | ICD-10-CM

## 2018-08-25 DIAGNOSIS — Z7982 Long term (current) use of aspirin: Secondary | ICD-10-CM

## 2018-08-25 DIAGNOSIS — F03918 Unspecified dementia, unspecified severity, with other behavioral disturbance: Secondary | ICD-10-CM

## 2018-08-25 DIAGNOSIS — F0391 Unspecified dementia with behavioral disturbance: Secondary | ICD-10-CM

## 2018-08-25 DIAGNOSIS — I69354 Hemiplegia and hemiparesis following cerebral infarction affecting left non-dominant side: Secondary | ICD-10-CM | POA: Diagnosis not present

## 2018-08-25 DIAGNOSIS — E119 Type 2 diabetes mellitus without complications: Secondary | ICD-10-CM

## 2018-08-25 DIAGNOSIS — Z9049 Acquired absence of other specified parts of digestive tract: Secondary | ICD-10-CM

## 2018-08-25 LAB — I-STAT VENOUS BLOOD GAS, ED
ACID-BASE EXCESS: 9 mmol/L — AB (ref 0.0–2.0)
BICARBONATE: 35.2 mmol/L — AB (ref 20.0–28.0)
O2 Saturation: 46 %
PCO2 VEN: 56.1 mmHg (ref 44.0–60.0)
PO2 VEN: 26 mmHg — AB (ref 32.0–45.0)
TCO2: 37 mmol/L — ABNORMAL HIGH (ref 22–32)
pH, Ven: 7.405 (ref 7.250–7.430)

## 2018-08-25 LAB — COMPREHENSIVE METABOLIC PANEL
ALK PHOS: 89 U/L (ref 38–126)
ALT: 14 U/L (ref 0–44)
ANION GAP: 10 (ref 5–15)
AST: 21 U/L (ref 15–41)
Albumin: 3.6 g/dL (ref 3.5–5.0)
BUN: 8 mg/dL (ref 8–23)
CHLORIDE: 101 mmol/L (ref 98–111)
CO2: 30 mmol/L (ref 22–32)
Calcium: 8.8 mg/dL — ABNORMAL LOW (ref 8.9–10.3)
Creatinine, Ser: 0.96 mg/dL (ref 0.61–1.24)
GFR calc non Af Amer: >60 mL/min (ref >60)
GLUCOSE: 115 mg/dL — AB (ref 70–99)
Potassium: 2.4 mmol/L — CL (ref 3.5–5.1)
SODIUM: 141 mmol/L (ref 135–145)
Total Bilirubin: 0.9 mg/dL (ref 0.3–1.2)
Total Protein: 6.6 g/dL (ref 6.5–8.1)

## 2018-08-25 LAB — CBG MONITORING, ED
GLUCOSE-CAPILLARY: 121 mg/dL — AB (ref 70–99)
Glucose-Capillary: 83 mg/dL (ref 70–99)

## 2018-08-25 LAB — CBC
HEMATOCRIT: 40.2 % (ref 39.0–52.0)
HEMOGLOBIN: 12.8 g/dL — AB (ref 13.0–17.0)
MCH: 27.7 pg (ref 26.0–34.0)
MCHC: 31.8 g/dL (ref 30.0–36.0)
MCV: 87 fL (ref 80.0–100.0)
NRBC: 0 % (ref 0.0–0.2)
Platelets: 209 10*3/uL (ref 150–400)
RBC: 4.62 MIL/uL (ref 4.22–5.81)
RDW: 12.6 % (ref 11.5–15.5)
WBC: 7 10*3/uL (ref 4.0–10.5)

## 2018-08-25 LAB — MAGNESIUM: Magnesium: 1.8 mg/dL (ref 1.7–2.4)

## 2018-08-25 LAB — VITAMIN B12: Vitamin B-12: 569 pg/mL (ref 180–914)

## 2018-08-25 LAB — AMMONIA: Ammonia: 27 umol/L (ref 9–35)

## 2018-08-25 LAB — TSH: TSH: 1.733 u[IU]/mL (ref 0.350–4.500)

## 2018-08-25 LAB — ETHANOL

## 2018-08-25 LAB — I-STAT TROPONIN, ED: TROPONIN I, POC: 0.01 ng/mL (ref 0.00–0.08)

## 2018-08-25 LAB — I-STAT CG4 LACTIC ACID, ED: LACTIC ACID, VENOUS: 1.68 mmol/L (ref 0.5–1.9)

## 2018-08-25 MED ORDER — VITAMIN D3 1.25 MG (50000 UT) PO CAPS: 1.0000 | ORAL_CAPSULE | ORAL | Status: DC

## 2018-08-25 MED ORDER — OLANZAPINE 5 MG PO TBDP
5.0000 mg | ORAL_TABLET | Freq: Every day | ORAL | Status: DC
  Administered 2018-08-28: 5 mg via ORAL
  Filled 2018-08-25 (×4): qty 1

## 2018-08-25 MED ORDER — LABETALOL HCL 5 MG/ML IV SOLN
10.0000 mg | INTRAVENOUS | Status: DC | PRN
  Administered 2018-08-26 – 2018-08-28 (×3): 10 mg via INTRAVENOUS
  Filled 2018-08-25 (×4): qty 4

## 2018-08-25 MED ORDER — LINACLOTIDE 145 MCG PO CAPS
145.0000 ug | ORAL_CAPSULE | Freq: Every day | ORAL | Status: DC
  Administered 2018-08-26 – 2018-08-28 (×2): 145 ug via ORAL
  Filled 2018-08-25 (×4): qty 1

## 2018-08-25 MED ORDER — SODIUM CHLORIDE 0.9 % IV SOLN
INTRAVENOUS | Status: DC
  Administered 2018-08-25 – 2018-08-26 (×2): via INTRAVENOUS

## 2018-08-25 MED ORDER — VITAMIN D (ERGOCALCIFEROL) 1.25 MG (50000 UNIT) PO CAPS: 50000.0000 [IU] | ORAL_CAPSULE | ORAL | Status: DC

## 2018-08-25 MED ORDER — OLANZAPINE 5 MG PO TBDP
2.5000 mg | ORAL_TABLET | Freq: Once | ORAL | Status: DC
  Filled 2018-08-25: qty 0.5

## 2018-08-25 MED ORDER — ATORVASTATIN CALCIUM 80 MG PO TABS
80.0000 mg | ORAL_TABLET | Freq: Every day | ORAL | Status: DC
  Filled 2018-08-25: qty 1

## 2018-08-25 MED ORDER — POTASSIUM CHLORIDE 10 MEQ/100ML IV SOLN
10.0000 meq | INTRAVENOUS | Status: DC
  Administered 2018-08-25: 10 meq via INTRAVENOUS
  Filled 2018-08-25: qty 100

## 2018-08-25 MED ORDER — ALBUTEROL SULFATE (2.5 MG/3ML) 0.083% IN NEBU: 3.0000 mL | INHALATION_SOLUTION | Freq: Four times a day (QID) | RESPIRATORY_TRACT | Status: DC | PRN

## 2018-08-25 MED ORDER — AMLODIPINE BESYLATE 2.5 MG PO TABS
2.5000 mg | ORAL_TABLET | Freq: Every day | ORAL | Status: DC
  Administered 2018-08-26: 2.5 mg via ORAL
  Filled 2018-08-25 (×4): qty 1

## 2018-08-25 MED ORDER — FOLIC ACID 1 MG PO TABS
1.0000 mg | ORAL_TABLET | Freq: Every day | ORAL | Status: DC
  Administered 2018-08-26 – 2018-08-28 (×2): 1 mg via ORAL
  Filled 2018-08-25 (×3): qty 1

## 2018-08-25 MED ORDER — INSULIN ASPART 100 UNIT/ML ~~LOC~~ SOLN
0.0000 [IU] | Freq: Three times a day (TID) | SUBCUTANEOUS | Status: DC
  Administered 2018-08-27 – 2018-08-28 (×2): 1 [IU] via SUBCUTANEOUS

## 2018-08-25 MED ORDER — INSULIN ASPART 100 UNIT/ML ~~LOC~~ SOLN: 0.0000 [IU] | Freq: Every day | SUBCUTANEOUS | Status: DC

## 2018-08-25 MED ORDER — POTASSIUM CHLORIDE 10 MEQ/100ML IV SOLN
10.0000 meq | INTRAVENOUS | Status: AC
  Administered 2018-08-25 – 2018-08-26 (×6): 10 meq via INTRAVENOUS
  Filled 2018-08-25 (×6): qty 100

## 2018-08-25 MED ORDER — VITAMIN B-1 100 MG PO TABS
100.0000 mg | ORAL_TABLET | Freq: Every day | ORAL | Status: DC
  Administered 2018-08-26 – 2018-08-28 (×2): 100 mg via ORAL
  Filled 2018-08-25 (×3): qty 1

## 2018-08-25 MED ORDER — HALOPERIDOL LACTATE 5 MG/ML IJ SOLN
2.0000 mg | Freq: Four times a day (QID) | INTRAMUSCULAR | Status: DC | PRN
  Administered 2018-08-25 – 2018-08-26 (×3): 2 mg via INTRAVENOUS
  Filled 2018-08-25 (×3): qty 1

## 2018-08-25 MED ORDER — LORAZEPAM 2 MG/ML IJ SOLN
0.5000 mg | Freq: Once | INTRAMUSCULAR | Status: AC
  Administered 2018-08-25: 0.5 mg via INTRAVENOUS
  Filled 2018-08-25: qty 1

## 2018-08-25 MED ORDER — CLOPIDOGREL BISULFATE 75 MG PO TABS
75.0000 mg | ORAL_TABLET | Freq: Every day | ORAL | Status: DC
  Administered 2018-08-26 – 2018-08-28 (×2): 75 mg via ORAL
  Filled 2018-08-25 (×3): qty 1

## 2018-08-25 NOTE — ED Provider Notes (Addendum)
[facility]   CSN: [id number] Arrival date & time: 08/25/18  1420     History   Chief Complaint Chief Complaint  Patient presents with  . Altered Mental Status    HPI [name] is a 77 y.o. male with past medical history of stroke x 2 (July and November 2018), DM type I and HTN, presented with altered mental status since last week that worsened yesterday. Patient is minimally verbal at baseline and does not speak english. Daughter who is in the room is source of interview. She reports that patient has been more confused than his baseline, pooped and peed in his bed and in the room yesterday, refused to take his medications and did not let her to clean him. Has also been aggressive sometimes, did not her to feed her. He slept a lot yesterday. And did not walk around a lot. No cough, fever or chills, no diarrhea. On his baseline after second stroke, he is able to walk around befor this episode of altered mental status. He does not communicate well with family at base line although some times talk with himself which is unclear for family to understand.     HPI  Past Medical History:  Diagnosis Date  . Diabetes (HCC)   . Dizziness   . Hypertension   . Stroke [facility](HCC)    x 2, last Nov 2018    Patient Active Problem List   Diagnosis Date Noted  . Aphasia 05/19/2018  . Aphasia as late effect of stroke 12/23/2017  . Cytotoxic cerebral edema (HCC)   . History of stroke   . Agitation   . Blurred vision, bilateral 06/24/2017  . Dyslipidemia 04/10/2017  . 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 Acute ischemic left MCA stroke (HCC) 04/05/2017  . Left hemiparesis (HCC)   . Oropharyngeal dysphagia   . Confusion   . Altered mental state   . Acute CVA / Lt MCA 08/01/2017, Rt MCA 04/02/2017 04/02/2017  . AKI (acute kidney injury) (HCC) 04/02/2017  . DM (diabetes mellitus) (HCC) 04/02/2017  . Elevated transaminase level 04/02/2017  .  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 Diabetic complication (HCC) 03/26/2017  . Dizziness 03/26/2017  . Postop check 03/11/2012  . HYDROCELE, RIGHT 08/28/2010  . NOCTURIA 08/28/2010  . OTH&UNSPEC NONINFECTIOUS GASTROENTERITIS&COLITIS 06/29/2010  . ABDOMINAL PAIN-RLQ 06/29/2010  . ABNORMAL FINDINGS GI TRACT 06/29/2010  . HYPERTENSION, BENIGN ESSENTIAL 05/02/2010  . LEUKOCYTOSIS 04/20/2010  . COLITIS, HX OF 04/20/2010    Past Surgical History:  Procedure Laterality Date  . LAPAROSCOPIC APPENDECTOMY  02/21/2012   Procedure: APPENDECTOMY LAPAROSCOPIC;  Surgeon: Cherylynn RidgesJames O Wyatt, MD;  Location: [facility] OR;  Service: General;  Laterality: N/A;        Home Medications    Prior to Admission medications   Medication Sig Start Date End Date Taking? Authorizing Provider  albuterol (PROVENTIL HFA;VENTOLIN HFA) 108 (90 Base) MCG/ACT inhaler Inhale 2 puffs into the lungs every 6 (six) hours as needed for wheezing or shortness of breath.    [provider]  amLODipine (NORVASC) 2.5 MG tablet Take 1 tablet (2.5 mg total) by mouth daily. 05/22/18 05/22/19  Maretta BeesGhimire, Shanker M, MD  aspirin EC 325 MG EC tablet Take 1 tablet (325 mg total) daily by mouth. 08/05/17   Rolly SalterPatel, Pranav M, MD  atorvastatin (LIPITOR) 80 MG tablet Take 1 tablet (80 mg total) by mouth daily at 6 PM. 04/11/17   Love, Evlyn KannerPamela S, PA-C  Cholecalciferol (  VITAMIN D3) 50000 units CAPS Take 1 capsule by mouth once a week.    [provider]  clopidogrel (PLAVIX) 75 MG tablet Take 1 tablet (75 mg total) by mouth daily. 04/11/17   Love, Evlyn Kanner, PA-C  folic acid (FOLVITE) 1 MG tablet Take 1 mg by mouth daily. 03/31/18   [provider]  glucose blood test strip Use as instructed 04/16/17   Ranelle Oyster, MD  Lancets [facility] ULTRASOFT) lancets Check blood sugars twice a day 04/16/17   Ranelle Oyster, MD  [facility] 145 MCG CAPS capsule Take 145 mcg by mouth daily before breakfast.  08/25/17   [provider]  loratadine (CLARITIN) 10 MG tablet Take 10 mg by mouth daily.  12/10/11   [provider]  meclizine (ANTIVERT) 12.5 MG tablet Take 12.5 mg by mouth 3 (three) times daily as needed for dizziness.    [provider]  metFORMIN (GLUCOPHAGE) 500 MG tablet Take 500 mg by mouth 2 (two) times daily with a meal.    [provider]  OLANZapine zydis (ZYPREXA) 5 MG disintegrating tablet Take 1 tablet (5 mg total) by mouth at bedtime. 05/22/18   Ghimire, Werner Lean, MD  OLANZapine zydis (ZYPREXA) 5 MG disintegrating tablet Take 0.5 tablets (2.5 mg total) by mouth daily as needed (agitation). 05/22/18   Ghimire, Werner Lean, MD  sertraline (ZOLOFT) 100 MG tablet Take 100 mg by mouth daily. 02/28/18   [provider]  thiamine 100 MG tablet Take 1 tablet (100 mg total) by mouth daily. 05/22/18   Ghimire, Werner Lean, MD  traZODone (DESYREL) 100 MG tablet Take 100 mg by mouth at bedtime.     [provider]    Family History Family History  Problem Relation Age of Onset  . Diabetes Neg Hx   . Stroke Neg Hx   . Cancer Neg Hx     Social History Social History   Tobacco Use  . Smoking status: Never Smoker  . Smokeless tobacco: Never Used  Substance Use Topics  . Alcohol use: No  . Drug use: No     Allergies   Patient has no known allergies.   Review of Systems Review of Systems  Constitutional: Negative for chills and fever.  Respiratory: Negative for cough.   Cardiovascular: Negative for leg swelling.  Gastrointestinal: Negative for constipation, diarrhea and vomiting.  Neurological: Negative for seizures.  Psychiatric/Behavioral: Positive for agitation and behavioral problems.     Physical Exam Updated Vital Signs BP (!) 149/95 (BP Location: Right Arm)   Pulse 72   Temp 98 F (36.7 C) (Temporal)   Resp 18   SpO2 97%   Physical Exam  Constitutional: He appears lethargic. Is not cooperative. Is aggressive during the exam  sometimes. HENT:  Head: Normocephalic and atraumatic.  Neck: No neck stiffness Cardiovascular: Normal rate and regular rhythm.  Pulmonary/Chest: Effort normal and breath sounds normal. No respiratory distress. He has no wheezes. He has no rales.  Abdominal: Soft. There is no tenderness. There is no guarding.  Musculoskeletal: He exhibits no edema.  Neurological: He appears lethargic. Does not obey the commands. Moves his extremities sometimes. Does not obey the commands for exam.  ED Treatments / Results  Labs (all labs ordered are listed, but only abnormal results are displayed) Labs Reviewed  CBC - Abnormal; Notable for the following components:      Result Value   Hemoglobin 12.8 (*)    All other components within normal  limits  CBG MONITORING, ED - Abnormal; Notable for the following components:   Glucose-Capillary 121 (*)    All other components within normal limits  URINE CULTURE  COMPREHENSIVE METABOLIC PANEL  AMMONIA  ETHANOL  URINALYSIS, ROUTINE W REFLEX MICROSCOPIC  TSH  CBG MONITORING, ED  I-STAT CG4 LACTIC ACID, ED  I-STAT VENOUS BLOOD GAS, ED  I-STAT TROPONIN, ED    EKG EKG Interpretation  Date/Time:  Monday August 25 2018 14:28:30 EST Ventricular Rate:  71 PR Interval:    QRS Duration: 90 QT Interval:  387 QTC Calculation: 421 R Axis:   75 Text Interpretation:  Sinus rhythm Nonspecific repol abnormality, diffuse leads No significant change since last tracing Confirmed by Melene Plan [phone]) on 08/25/2018 3:25:17 PM   Radiology No results found.  Procedures Procedures (including critical care time)  Medications Ordered in ED Medications - No data to display   Initial Impression / Assessment and Plan / ED Course  I have reviewed the triage vital signs and the nursing notes.  Pertinent labs & imaging results that were available during my care of the patient were reviewed by me and considered in my medical decision making (see chart for  details).     77 year old male with past medical history of stroke x 2, DM type I, HTN, presented with altered mental status, and aggressive behavior since last week that worsened yesterday.    He refused his medications and peeing and pooping on his bed and the room yesterday. Daughter who is in the room, did not observe any cough, fever or chills, diarrhea. Also says patient did not complain of any pain. On his baseline after second stroke, he is generally minimally verbal. some times talk with him self but not communicate well with family. He has been able to walk until yesterday.  On arrival, he is lethargic, not verbal, does not obey the commands and sometimes aggressive during the exam. Hemodynamically stable and  is afebrile. Full neuro exam is not possible as he is not cooperative, he moves his extremities and no gross neurological deficit. No neck stiffness.  Lungs are clear on ant chest auscultation. Abdomen is soft and non tender  No evidence of infection or acute neurologic deficit based on history and on exam but will evaluate for Infectious source of dilirium, vs metabolic derangements, vs new neurologic etiology.   -CBC -CMP -TSH -CXR  -UA -Head Ct scan  Update:  CMP: Sever hypokalemia at 2.8, 40 meq IV K started. Check Mg. CBC with no leukocytosis CXR unremarkable and Head Ct scan with no acute abnormality. Rest of tests pending. Hypokalemia can be a cause of weakness but unclear what the cause of his delirium is. U/A pending.   -Consulted hospitalist for admission. Appreciate their follow up   Final Clinical Impressions(s) / ED Diagnoses   Final diagnoses:  None    ED Discharge Orders    None       Chevis Pretty, MD 08/25/18 1840    Chevis Pretty, MD 08/25/18 1845    Melene Plan, DO 08/25/18 1912    Melene Plan, DO 08/25/18 2350

## 2018-08-25 NOTE — Progress Notes (Signed)
Pt came to MRI would not listen to staff or daughter. Pt was very confused when in the dept. When preparing patient for exam pt sat up and pulled his arms away from staff refusing to let staff change.  Pt then sat in the bed and would not lay back.

## 2018-08-25 NOTE — ED Triage Notes (Signed)
Patient presents to ed via [facility] states they were called for patient having alerted loc onset yest. Per ems patient had a CVA ion 2018 , family states he doesn't talk, however ems states patient was combative and screaming  Upon their arrival . Family states patient has had decreased appetite , will not get dressed and refuses medication. Patient was given versed 5 mg IM per ems. In order to get patient out of the house. Moves all ext. X 4.

## 2018-08-25 NOTE — ED Notes (Signed)
Pt continues to pull off tele monitor and BP cuff. Pt continues to try to hit staff.

## 2018-08-25 NOTE — ED Notes (Signed)
Family at bedside. 

## 2018-08-25 NOTE — ED Notes (Signed)
Patient transported to MRI 

## 2018-08-25 NOTE — H&P (Signed)
History and Physical    Man Effertz NWG:[id number] DOB: 11/26/1940 DOA: 08/25/2018  PCP: Fleet Contras, MD   Patient coming from: Home    Chief Complaint: Altered mental status  HPI: [name] is a 77 y.o. male with medical history significant of 2 strokes (July and November 2018),  left residual hemiparesis, DM type 2 , HTN, hyperlipidemia who was brought from home by his daughter with complaints of altered mental status.  Patient was acting well and doing his baseline activities until 2 days ago.  He has history of stroke and likely vascular dementia.  He is mostly nonverbal on baseline but can ask for water, eats his food, goes to the bathroom and walks well and he stopped  doing these 2 days ago.  Since last 2 days, he has been increasingly weak , lying on the bed most of the time, incontinent of bowel and bladder, refusing his meds and food.  The symptoms have slowly progressed since last week but escalated since last 2 days.  He has also been aggressive at times. There was no report of fever, chest pain, shortness of breath, abdominal pain, diarrhea, nausea, vomiting, loss of consciousness as per the daughter. Patient was admitted with similar symptoms last time on August.  Since then he had improved and was following with neurology and psychiatry as an outpatient for the follow-up of a stroke and dementia. Patient seen and examined the bedside in the emergency department.  He was noted to be hypertensive.  He was not in any kind of acute distress, calmly lying on the bed.  When tried to communicate, he mumbles without any recognizable words.  History was taken from the daughter.  ED Course: CT head was done which did not show any acute intracranial emergency.  Chest x-ray did not show any acute problems.  Noted to be severely hypokalemic and supplemented with potassium.    Past Medical History:  Diagnosis Date  . Diabetes (HCC)   . Dizziness   . Hypertension   . Stroke [facility])    x  2, last Nov 2018    Past Surgical History:  Procedure Laterality Date  . LAPAROSCOPIC APPENDECTOMY  02/21/2012   Procedure: APPENDECTOMY LAPAROSCOPIC;  Surgeon: Cherylynn Ridges, MD;  Location: [facility] OR;  Service: General;  Laterality: N/A;     reports that he has never smoked. He has never used smokeless tobacco. He reports that he does not drink alcohol or use drugs.  No Known Allergies  Family History  Problem Relation Age of Onset  . Diabetes Neg Hx   . Stroke Neg Hx   . Cancer Neg Hx      Prior to Admission medications   Medication Sig Start Date End Date Taking? Authorizing Provider  albuterol (PROVENTIL HFA;VENTOLIN HFA) 108 (90 Base) MCG/ACT inhaler Inhale 2 puffs into the lungs every 6 (six) hours as needed for wheezing or shortness of breath.    [provider]  amLODipine (NORVASC) 2.5 MG tablet Take 1 tablet (2.5 mg total) by mouth daily. 05/22/18 05/22/19  Maretta Bees, MD  aspirin EC 325 MG EC tablet Take 1 tablet (325 mg total) daily by mouth. 08/05/17   Rolly Salter, MD  atorvastatin (LIPITOR) 80 MG tablet Take 1 tablet (80 mg total) by mouth daily at 6 PM. 04/11/17   Love, Evlyn Kanner, PA-C  Cholecalciferol (VITAMIN D3) 50000 units CAPS Take 1 capsule by mouth once a week.    [provider]  clopidogrel (  PLAVIX) 75 MG tablet Take 1 tablet (75 mg total) by mouth daily. 04/11/17   Love, Evlyn KannerPamela S, PA-C  folic acid (FOLVITE) 1 MG tablet Take 1 mg by mouth daily. 03/31/18   [provider]  glucose blood test strip Use as instructed 04/16/17   Ranelle OysterSwartz, Zachary T, MD  Lancets [facility](ONETOUCH ULTRASOFT) lancets Check blood sugars twice a day 04/16/17   Ranelle OysterSwartz, Zachary T, MD  [facility] 145 MCG CAPS capsule Take 145 mcg by mouth daily before breakfast.  08/25/17   [provider]  loratadine (CLARITIN) 10 MG tablet Take 10 mg by mouth daily.  12/10/11   [provider]  meclizine (ANTIVERT) 12.5 MG tablet Take 12.5 mg by mouth 3 (three) times daily  as needed for dizziness.    [provider]  metFORMIN (GLUCOPHAGE) 500 MG tablet Take 500 mg by mouth 2 (two) times daily with a meal.    [provider]  OLANZapine zydis (ZYPREXA) 5 MG disintegrating tablet Take 1 tablet (5 mg total) by mouth at bedtime. 05/22/18   Ghimire, Werner LeanShanker M, MD  OLANZapine zydis (ZYPREXA) 5 MG disintegrating tablet Take 0.5 tablets (2.5 mg total) by mouth daily as needed (agitation). 05/22/18   Ghimire, Werner LeanShanker M, MD  sertraline (ZOLOFT) 100 MG tablet Take 100 mg by mouth daily. 02/28/18   [provider]  thiamine 100 MG tablet Take 1 tablet (100 mg total) by mouth daily. 05/22/18   Ghimire, Werner LeanShanker M, MD  traZODone (DESYREL) 100 MG tablet Take 100 mg by mouth at bedtime.     [provider]    Physical Exam: Vitals:   08/25/18 1445 08/25/18 1500 08/25/18 1600 08/25/18 1630  BP: (!) 143/81 134/86 (!) 148/93 (!) 154/72  Pulse: 70 64 60 62  Resp: (!) 24 (!) 22 18   Temp: 98 F (36.7 C)     TempSrc: Temporal     SpO2: 97% 96% 98% 99%    Constitutional: Calm, confused, thin built Vitals:   08/25/18 1445 08/25/18 1500 08/25/18 1600 08/25/18 1630  BP: (!) 143/81 134/86 (!) 148/93 (!) 154/72  Pulse: 70 64 60 62  Resp: (!) 24 (!) 22 18   Temp: 98 F (36.7 C)     TempSrc: Temporal     SpO2: 97% 96% 98% 99%   Eyes: PERRL, lids and conjunctivae normal ENMT: Mucous membranes are moist. Posterior pharynx clear of any exudate or lesions.Normal dentition.  Neck: normal, supple, no masses, no thyromegaly Respiratory: clear to auscultation bilaterally, no wheezing, no crackles. Normal respiratory effort. No accessory muscle use.  Cardiovascular: Regular rate and rhythm, no murmurs / rubs / gallops. No extremity edema. 2+ pedal pulses. No carotid bruits.  Abdomen: no tenderness, no masses palpated. No hepatosplenomegaly. Bowel sounds positive.  Musculoskeletal: no clubbing / cyanosis. No joint deformity upper and lower extremities.  Good ROM, no contractures. Normal muscle tone.  Skin: no rashes,No induration.  Mild excoriations of the old skin lesions on his buttocks Neurologic: CN 2-12 grossly intact. Sensation and strength could not be examined due to his patient's mental status  psychiatric: Impaired  judgment and insight. Alert Foley Catheter:None  Labs on Admission: I have personally reviewed following labs and imaging studies  CBC: Recent Labs  Lab 08/25/18 1500  WBC 7.0  HGB 12.8*  HCT 40.2  MCV 87.0  PLT 209   Basic Metabolic Panel: Recent Labs  Lab 08/25/18 1500 08/25/18 1636  NA 141  --   K 2.4*  --  CL 101  --   CO2 30  --   GLUCOSE 115*  --   BUN 8  --   CREATININE 0.96  --   CALCIUM 8.8*  --   MG  --  1.8   GFR: CrCl cannot be calculated (Unknown ideal weight.). Liver Function Tests: Recent Labs  Lab 08/25/18 1500  AST 21  ALT 14  ALKPHOS 89  BILITOT 0.9  PROT 6.6  ALBUMIN 3.6   No results for input(s): LIPASE, AMYLASE in the last 168 hours. Recent Labs  Lab 08/25/18 1636  AMMONIA 27   Coagulation Profile: No results for input(s): INR, PROTIME in the last 168 hours. Cardiac Enzymes: No results for input(s): CKTOTAL, CKMB, CKMBINDEX, TROPONINI in the last 168 hours. BNP (last 3 results) No results for input(s): PROBNP in the last 8760 hours. HbA1C: No results for input(s): HGBA1C in the last 72 hours. CBG: Recent Labs  Lab 08/25/18 1437  GLUCAP 121*   Lipid Profile: No results for input(s): CHOL, HDL, LDLCALC, TRIG, CHOLHDL, LDLDIRECT in the last 72 hours. Thyroid Function Tests: Recent Labs    08/25/18 1636  TSH 1.733   Anemia Panel: No results for input(s): VITAMINB12, FOLATE, FERRITIN, TIBC, IRON, RETICCTPCT in the last 72 hours. Urine analysis:    Component Value Date/Time   COLORURINE YELLOW 05/19/2018 0854   APPEARANCEUR CLEAR 05/19/2018 0854   LABSPEC 1.014 05/19/2018 0854   PHURINE 5.0 05/19/2018 0854   GLUCOSEU NEGATIVE 05/19/2018 0854    HGBUR NEGATIVE 05/19/2018 0854   HGBUR negative 08/28/2010 0959   BILIRUBINUR NEGATIVE 05/19/2018 0854   KETONESUR NEGATIVE 05/19/2018 0854   PROTEINUR NEGATIVE 05/19/2018 0854   UROBILINOGEN 1.0 02/20/2012 1944   NITRITE NEGATIVE 05/19/2018 0854   LEUKOCYTESUR NEGATIVE 05/19/2018 0854    Radiological Exams on Admission: Ct Head Wo Contrast  Result Date: 08/25/2018 CLINICAL DATA:  Infarct 2018 with change in mental status onset yesterday. EXAM: CT HEAD WITHOUT CONTRAST TECHNIQUE: Contiguous axial images were obtained from the base of the skull through the vertex without intravenous contrast. COMPARISON:  MRI and head CT 05/19/2018 FINDINGS: Brain: Chronic moderate small vessel ischemic disease of periventricular subcortical white matter with chronic left MCA territory infarct and encephalomalacia redemonstrated. No acute intraparenchymal hemorrhage, midline shift or edema. Sulcal and ventricular prominence is stable consistent with atrophy. Midline fourth ventricle basal cisterns without effacement. Brainstem and cerebellum appear intact. Vascular: Atherosclerosis at the skull base. No hyperdense vessel sign. Skull: Intact Sinuses/Orbits: Mild ethmoid sinus mucosal thickening. Intact orbits and globes. Other: Trace left mastoid effusion. IMPRESSION: Stable advanced chronic small-vessel ischemic disease with cerebral atrophy. Chronic left MCA territory infarct with encephalomalacia. No acute intraparenchymal abnormality. Electronically Signed   By: Tollie Eth M.D.   On: 08/25/2018 16:21   Dg Chest Port 1 View  Result Date: 08/25/2018 CLINICAL DATA:  Acute mental status change EXAM: PORTABLE CHEST 1 VIEW COMPARISON:  August 01, 2017 FINDINGS: The heart size and mediastinal contours are within normal limits. Both lungs are clear. The visualized skeletal structures are unremarkable. IMPRESSION: No active disease. Electronically Signed   By: Gerome Sam III M.D   On: 08/25/2018 15:51      Assessment/Plan Principal Problem:   Altered mental state Active Problems:   HYPERTENSION, BENIGN ESSENTIAL   Acute CVA / Lt MCA 08/01/2017, Rt MCA 04/02/2017   DM (diabetes mellitus) (HCC)   Dyslipidemia   Hypokalemia   Dementia with behavioral disturbance (HCC)   AMS (altered mental status)  Altered mental  status: Unknown etiology but could be multifactorial: Delirium on the background of his dementia/stroke, electrolyte abnormalities or new stroke. We will continue to monitor his mental status.  Will check ammonia level, TSH, EEG,Vitamin B12. CT head did not show any acute intracranial abnormalities but just showed chronic changes.  Will get MRI of the brain. Continue on clear liquid diet for now.  Will get a speech therapy evaluation.  History of stroke x2: With left residual hemiplegia, vascular dementia: Follows with neurology as an outpatient.  On Plavix and Lipitor which we will continue.  Also noted to be on aspirin at home.  We will reconcile his meds. Patient has home health arranged.  He is ambulatory on baseline, mostly nonverbal but asks for food and water, eats well.  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  Follow with psychiatry.  We expect agitation.  Continue Zyprexa.  Also reported on Zoloft and trazodone at home.  Will restart these meds after reconciling by pharmacy.  Hypertension: On hypertensive urgency with blood pressure noted to be in the range of 180s/100s.  Continue labetalol PRN. Continue to monitor the blood pressure on telemetry.  Hypokalemia: Severe.  Patient was reported to have incontinence of bowels but no mention of diarrhea.  Will check magnesium level.  We will aggressively supplement the potassium.  Continue IV fluids.  Hyperlipidemia: Continue Lipitor  Diabetes: On oral medications at home.  We will continue sliding-scale insulin here.    Severity of Illness: The appropriate patient  status for this patient is OBSERVATION.    DVT prophylaxis: SCD Code Status: Full Family Communication: daughter at bedside Consults called: None     Burnadette Pop MD [facility] Pager [phone]  If 7PM-7AM, please contact night-coverage [email or web address] Password [facility]  08/25/2018, 5:49 PM
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
## 2018-08-26 ENCOUNTER — Observation Stay (HOSPITAL_COMMUNITY): Payer: Medicare Other

## 2018-08-26 ENCOUNTER — Inpatient Hospital Stay (HOSPITAL_COMMUNITY)
Admit: 2018-08-26 | Discharge: 2018-08-26 | Disposition: A | Discharge status: Home / Self Care | Payer: Medicare Other | Attending: Internal Medicine | Admitting: Internal Medicine

## 2018-08-26 DIAGNOSIS — Z781 Physical restraint status: Secondary | ICD-10-CM | POA: Diagnosis not present

## 2018-08-26 DIAGNOSIS — I10 Essential (primary) hypertension: Secondary | ICD-10-CM | POA: Diagnosis not present

## 2018-08-26 DIAGNOSIS — Z515 Encounter for palliative care: Secondary | ICD-10-CM | POA: Diagnosis not present

## 2018-08-26 DIAGNOSIS — R451 Restlessness and agitation: Secondary | ICD-10-CM | POA: Diagnosis not present

## 2018-08-26 DIAGNOSIS — Z66 Do not resuscitate: Secondary | ICD-10-CM | POA: Diagnosis not present

## 2018-08-26 DIAGNOSIS — G9389 Other specified disorders of brain: Secondary | ICD-10-CM | POA: Diagnosis not present

## 2018-08-26 DIAGNOSIS — G4700 Insomnia, unspecified: Secondary | ICD-10-CM | POA: Diagnosis not present

## 2018-08-26 DIAGNOSIS — I160 Hypertensive urgency: Secondary | ICD-10-CM | POA: Diagnosis not present

## 2018-08-26 DIAGNOSIS — F0151 Vascular dementia with behavioral disturbance: Secondary | ICD-10-CM | POA: Diagnosis present

## 2018-08-26 DIAGNOSIS — I69398 Other sequelae of cerebral infarction: Secondary | ICD-10-CM | POA: Diagnosis not present

## 2018-08-26 DIAGNOSIS — F05 Delirium due to known physiological condition: Secondary | ICD-10-CM | POA: Diagnosis not present

## 2018-08-26 DIAGNOSIS — E109 Type 1 diabetes mellitus without complications: Secondary | ICD-10-CM | POA: Diagnosis not present

## 2018-08-26 DIAGNOSIS — R32 Unspecified urinary incontinence: Secondary | ICD-10-CM | POA: Diagnosis not present

## 2018-08-26 DIAGNOSIS — E785 Hyperlipidemia, unspecified: Secondary | ICD-10-CM | POA: Diagnosis not present

## 2018-08-26 DIAGNOSIS — E876 Hypokalemia: Secondary | ICD-10-CM | POA: Diagnosis not present

## 2018-08-26 DIAGNOSIS — R4182 Altered mental status, unspecified: Secondary | ICD-10-CM | POA: Diagnosis present

## 2018-08-26 DIAGNOSIS — R159 Full incontinence of feces: Secondary | ICD-10-CM | POA: Diagnosis not present

## 2018-08-26 DIAGNOSIS — I69354 Hemiplegia and hemiparesis following cerebral infarction affecting left non-dominant side: Secondary | ICD-10-CM | POA: Diagnosis not present

## 2018-08-26 LAB — CBC WITH DIFFERENTIAL/PLATELET
Abs Immature Granulocytes: 0.03 10*3/uL (ref 0.00–0.07)
Basophils Absolute: 0 10*3/uL (ref 0.0–0.1)
Basophils Relative: 1 %
Eosinophils Absolute: 0.2 10*3/uL (ref 0.0–0.5)
Eosinophils Relative: 2 %
HCT: 40.8 % (ref 39.0–52.0)
Hemoglobin: 13.1 g/dL (ref 13.0–17.0)
Immature Granulocytes: 0 %
Lymphocytes Relative: 17 %
Lymphs Abs: 1.3 10*3/uL (ref 0.7–4.0)
MCH: 27.6 pg (ref 26.0–34.0)
MCHC: 32.1 g/dL (ref 30.0–36.0)
MCV: 85.9 fL (ref 80.0–100.0)
Monocytes Absolute: 1.1 10*3/uL — ABNORMAL HIGH (ref 0.1–1.0)
Monocytes Relative: 15 %
Neutro Abs: 5 10*3/uL (ref 1.7–7.7)
Neutrophils Relative %: 65 %
Platelets: 206 10*3/uL (ref 150–400)
RBC: 4.75 MIL/uL (ref 4.22–5.81)
RDW: 12.4 % (ref 11.5–15.5)
WBC: 7.7 10*3/uL (ref 4.0–10.5)
nRBC: 0 % (ref 0.0–0.2)

## 2018-08-26 LAB — BASIC METABOLIC PANEL
Anion gap: 11 (ref 5–15)
Anion gap: 12 (ref 5–15)
BUN: 5 mg/dL — ABNORMAL LOW (ref 8–23)
CALCIUM: 8.8 mg/dL — AB (ref 8.9–10.3)
CO2: 26 mmol/L (ref 22–32)
CO2: 29 mmol/L (ref 22–32)
Calcium: 8.6 mg/dL — ABNORMAL LOW (ref 8.9–10.3)
Chloride: 100 mmol/L (ref 98–111)
Chloride: 101 mmol/L (ref 98–111)
Creatinine, Ser: 0.78 mg/dL (ref 0.61–1.24)
Creatinine, Ser: 0.94 mg/dL (ref 0.61–1.24)
GFR calc Af Amer: >60 mL/min (ref >60)
GFR calc non Af Amer: >60 mL/min (ref >60)
GFR calc non Af Amer: >60 mL/min (ref >60)
Glucose, Bld: 113 mg/dL — ABNORMAL HIGH (ref 70–99)
Glucose, Bld: 95 mg/dL (ref 70–99)
Potassium: 2.8 mmol/L — ABNORMAL LOW (ref 3.5–5.1)
Potassium: 3.2 mmol/L — ABNORMAL LOW (ref 3.5–5.1)
SODIUM: 138 mmol/L (ref 135–145)
Sodium: 141 mmol/L (ref 135–145)

## 2018-08-26 LAB — URINALYSIS, ROUTINE W REFLEX MICROSCOPIC
BILIRUBIN URINE: NEGATIVE
Glucose, UA: NEGATIVE mg/dL
HGB URINE DIPSTICK: NEGATIVE
Ketones, ur: NEGATIVE mg/dL
Leukocytes, UA: NEGATIVE
Nitrite: NEGATIVE
PH: 7 (ref 5.0–8.0)
Protein, ur: NEGATIVE mg/dL
Specific Gravity, Urine: 1.009 (ref 1.005–1.030)

## 2018-08-26 LAB — GLUCOSE, CAPILLARY
Glucose-Capillary: 95 mg/dL (ref 70–99)
Glucose-Capillary: 93 mg/dL (ref 70–99)
Glucose-Capillary: 118 mg/dL — ABNORMAL HIGH (ref 70–99)

## 2018-08-26 LAB — AMMONIA: Ammonia: 18 umol/L (ref 9–35)

## 2018-08-26 LAB — MAGNESIUM: Magnesium: 1.6 mg/dL — ABNORMAL LOW (ref 1.7–2.4)

## 2018-08-26 LAB — CBG MONITORING, ED: Glucose-Capillary: 100 mg/dL — ABNORMAL HIGH (ref 70–99)

## 2018-08-26 LAB — TSH: TSH: 4.981 u[IU]/mL — ABNORMAL HIGH (ref 0.350–4.500)

## 2018-08-26 LAB — VITAMIN B12: Vitamin B-12: 572 pg/mL (ref 180–914)

## 2018-08-26 MED ORDER — LORAZEPAM 2 MG/ML IJ SOLN: 2.0000 mg | Freq: Once | INTRAMUSCULAR | Status: DC

## 2018-08-26 MED ORDER — OLANZAPINE 2.5 MG PO TABS
2.5000 mg | ORAL_TABLET | Freq: Every morning | ORAL | Status: DC
  Administered 2018-08-28: 2.5 mg via ORAL
  Filled 2018-08-26 (×2): qty 1

## 2018-08-26 MED ORDER — HALOPERIDOL LACTATE 5 MG/ML IJ SOLN
5.0000 mg | Freq: Four times a day (QID) | INTRAMUSCULAR | Status: DC | PRN
  Administered 2018-08-26 – 2018-08-28 (×3): 5 mg via INTRAVENOUS
  Filled 2018-08-26 (×4): qty 1

## 2018-08-26 MED ORDER — DEXTROSE-NACL 5-0.9 % IV SOLN
INTRAVENOUS | Status: DC
  Administered 2018-08-26: 18:00:00 via INTRAVENOUS

## 2018-08-26 MED ORDER — LORAZEPAM 2 MG/ML IJ SOLN
1.0000 mg | Freq: Once | INTRAMUSCULAR | Status: AC
  Administered 2018-08-26: 1 mg via INTRAVENOUS
  Filled 2018-08-26: qty 1

## 2018-08-26 MED ORDER — ASPIRIN 325 MG PO TABS
325.0000 mg | ORAL_TABLET | Freq: Every day | ORAL | Status: DC
  Administered 2018-08-26 – 2018-08-28 (×2): 325 mg via ORAL
  Filled 2018-08-26 (×3): qty 1

## 2018-08-26 MED ORDER — LORAZEPAM 2 MG/ML IJ SOLN
2.0000 mg | Freq: Once | INTRAMUSCULAR | Status: AC
  Administered 2018-08-26: 2 mg via INTRAVENOUS
  Filled 2018-08-26: qty 1

## 2018-08-26 NOTE — Progress Notes (Signed)
EEG completed; results pending.    

## 2018-08-26 NOTE — Evaluation (Signed)
Clinical/Bedside Swallow Evaluation Patient Details  Name: [name] MRN: [id number] Date of Birth: [birth year]  Today's Date: 08/26/2018 Time: SLP Start Time (ACUTE ONLY): 0800 SLP Stop Time (ACUTE ONLY): 0815 SLP Time Calculation (min) (ACUTE ONLY): 15 min  Past Medical History:  Past Medical History:  Diagnosis Date  . Diabetes (HCC)   . Dizziness   . Hypertension   . Stroke [facility])    x 2, last Nov 2018   Past Surgical History:  Past Surgical History:  Procedure Laterality Date  . LAPAROSCOPIC APPENDECTOMY  02/21/2012   Procedure: APPENDECTOMY LAPAROSCOPIC;  Surgeon: Cherylynn Ridges, MD;  Location: [facility] OR;  Service: General;  Laterality: N/A;   HPI:  [name] is a 77 y.o. male with medical history significant of 2 strokes (July and November 2018), left residual hemiparesis, DM type 2,HTN, hyperlipidemia who was brought from home by his daughter with complaints of altered mental status.  Patient was acting well and doing his baseline activities until 2 days ago.  He has history of stroke and likely vascular dementia.  He is mostly nonverbal on baseline but can ask for water, eats his food, goes to the bathroom and walks well and he stopped  doing these 2 days ago.  Patient was admitted with similar symptoms last time on August.  Since then he had improved and was following with neurology and psychiatry as an outpatient for the follow-up of a stroke and dementia. CT head was done which did not show any acute intracranial emergency.  Chest x-ray did not show any acute problems.  Noted to be severely hypokalemic and supplemented with potassium.   Assessment / Plan / Recommendation Clinical Impression  Pt with significant history of cognitive linguistic and dysphagia deficits as well as decreased compliance with perscribed therapy and recommendations. Most recent discharge indicates that pt discharged on regular diet with thin liquids. Bedside Swallow Evaluation was completed with pt in  wrist restraints d/t recent agitation. However pt able to hold cup and crackers to self-feed. Pt was nonverbal throughout but able to hold cup and consume thin liquids (> 3 oz per consumption) via straw with no indication of aspiration. Pt also consumed graham crackers with effective mastication and appropriate oral clearing. Recommend age appropriate solids and thin liquids with medication given whole in puree or crushed in puree (d/t mentation). It is likely that pt will cosnume more of family offered food items than hospital items. Education completed with nursing, ST to sign off at this time.  SLP Visit Diagnosis: Dysphagia, oropharyngeal phase (R13.12)    Aspiration Risk  Mild aspiration risk(d/t mentation)    Diet Recommendation Regular;Thin liquid   Liquid Administration via: Straw;Cup Medication Administration: Whole meds with puree Supervision: Staff to assist with self feeding;Patient able to self feed;Intermittent supervision to cue for compensatory strategies Compensations: Minimize environmental distractions;Slow rate;Small sips/bites Postural Changes: Seated upright at 90 degrees    Other  Recommendations Oral Care Recommendations: Oral care BID   Follow up Recommendations None        Swallow Study   General Date of Onset: 08/25/18 HPI: [name] is a 77 y.o. male with medical history significant of 2 strokes (July and November 2018), left residual hemiparesis, DM type 2,HTN, hyperlipidemia who was brought from home by his daughter with complaints of altered mental status.  Patient was acting well and doing his baseline activities until 2 days ago.  He has history of stroke and likely vascular dementia.  He is mostly nonverbal  on baseline but can ask for water, eats his food, goes to the bathroom and walks well and he stopped  doing these 2 days ago.  Patient was admitted with similar symptoms last time on August.  Since then he had improved and was following with neurology  and psychiatry as an outpatient for the follow-up of a stroke and dementia. CT head was done which did not show any acute intracranial emergency.  Chest x-ray did not show any acute problems.  Noted to be severely hypokalemic and supplemented with potassium. Type of Study: Bedside Swallow Evaluation Previous Swallow Assessment: BSE with final discharge on regular with thin liquids Diet Prior to this Study: Thin liquids Temperature Spikes Noted: No Respiratory Status: Room air History of Recent Intubation: No Behavior/Cognition: Alert;Agitated;Uncooperative Oral Cavity Assessment: Within Functional Limits Oral Care Completed by SLP: No Oral Cavity - Dentition: Poor condition;Missing dentition Vision: Functional for self-feeding Self-Feeding Abilities: Able to feed self;Needs assist;Needs set up Patient Positioning: Upright in bed(with restraints in place but loose) Baseline Vocal Quality: Not observed Volitional Cough: Cognitively unable to elicit Volitional Swallow: Unable to elicit    Oral/Motor/Sensory Function Overall Oral Motor/Sensory Function: (observed during functional consumption of food)   Ice Chips Ice chips: Not tested   Thin Liquid Thin Liquid: Within functional limits Presentation: Straw;Self Fed    Nectar Thick Nectar Thick Liquid: Not tested   Honey Thick Honey Thick Liquid: Not tested   Puree Puree: Not tested   Solid     Solid: Within functional limits Presentation: Self Fed Other Comments: (multiple graham crackers)      Khadija Thier 08/26/2018,9:31 AM

## 2018-08-26 NOTE — Progress Notes (Signed)
PROGRESS NOTE    [name]  ZOX:[id number] DOB: 11/22/1940 DOA: 08/25/2018 PCP: Fleet Contras, MD   Brief Narrative: [name] is a Nepalese speaking [place] 77 y.o. male with medical history significant of 2 strokes (July and November 2018), left residual hemiparesis, DM type 2,HTN, hyperlipidemia who was brought from home by his daughter with complaints of altered mental status.  Patient was acting well and doing his baseline activities until 2 days ago.  He has history of stroke and likely vascular dementia.  He is mostly nonverbal on baseline but can ask for water, eats his food, goes to the bathroom and walks well and he stopped  doing these 2 days ago.  Since last 2 days, he has been increasingly weak , lying on the bed most of the time, incontinent of bowel and bladder, refusing his meds and food. Patient admitted for further management.  Assessment & Plan:   Principal Problem:   Altered mental state Active Problems:   HYPERTENSION, BENIGN ESSENTIAL   Acute CVA / Lt MCA 08/01/2017, Rt MCA 04/02/2017   DM (diabetes mellitus) (HCC)   Dyslipidemia   Hypokalemia   Dementia with behavioral disturbance (HCC)   AMS (altered mental status)   Altered mental status: Unknown etiology but could be multifactorial: Delirium on the background of his dementia/stroke, electrolyte abnormalities or new stroke. We will continue to monitor his mental status.  CT head did not show any acute intracranial abnormalities but just showed chronic changes.  Will get MRI of the brain but its turning out to be  difficult because of persistent agitation EEG pending.  History of stroke x2: With left residual hemiplegia, vascular dementia: Follows with neurology as an outpatient.  On Aspirin,Plavix and Lipitor which we will continue.  Patient has home health arranged.  He is ambulatory on baseline, mostly nonverbal but asks for food and water, eats well.  Dementia with behavioral disturbance: Most  likely vascular dementia surgery with stroke.  Follow with psychiatry.  Persistent  agitation.  Continue Zyprexa.  Also reported on Zoloft and trazodone at home.  Requested for psychiatric consultation today.  Continue Haldol as needed for severe agitation.  Hypertension: On hypertensive urgency with blood pressure noted to be in the range of 180s/100s on presentation.  Continue labetalol PRN. Continue to monitor the blood pressure on telemetry.  Hypokalemia:   Patient was reported to have incontinence of bowels but no mention of diarrhea.  Being supplemented.  Hyperlipidemia: Continue Lipitor  Diabetes: On oral medications at home.  We will continue sliding-scale insulin here.       DVT prophylaxis: SCD Code Status: Full Family Communication: Daughter at the bedside Disposition Plan: Needs to be determined   Consultants: Psychiatry consult called  Procedures: None  Antimicrobials: None  Subjective: Patient seen and examined the bedside this morning.  Remained hemodynamically stable.  Persistently agitated.  Could not go for MRI.  Speaks with mumbling only when he is agitated and angry.  Objective: Vitals:   08/26/18 0045 08/26/18 0229 08/26/18 0602 08/26/18 0716  BP:  (!) 156/84 (!) 165/94 (!) 176/93  Pulse:  76 81 79  Resp: 15 (!) 22 20 18   Temp:   (!) 97.5 F (36.4 C) 97.9 F (36.6 C)  TempSrc:   Oral Axillary  SpO2:  100% 96% 100%    Intake/Output Summary (Last 24 hours) at 08/26/2018 1005 Last data filed at 08/26/2018 0654 Gross per 24 hour  Intake 1588.26 ml  Output -  Net 1588.26 ml  There were no vitals filed for this visit.  Examination:  General exam: Not in distress,thin built HEENT:PERRL,Oral mucosa moist, Ear/Nose normal on gross exam Respiratory system: Bilateral equal air entry, normal vesicular breath sounds, no wheezes or crackles  Cardiovascular system: S1 & S2 heard, RRR. No JVD, murmurs, rubs, gallops or clicks. No pedal  edema. Gastrointestinal system: Abdomen is nondistended, soft and nontender. No organomegaly or masses felt. Normal bowel sounds heard. Central nervous system: Alert but not  oriented.  Full neurological exam cudnt be obtained  extremities: No edema, no clubbing ,no cyanosis, distal peripheral pulses palpable. Skin: No rashes, lesions or ulcers,no icterus ,no pallor Psychiatry: Judgement and insight appear impaired   Data Reviewed: I have personally reviewed following labs and imaging studies  CBC: Recent Labs  Lab 08/25/18 1500 08/26/18 0325  WBC 7.0 7.7  NEUTROABS  --  5.0  HGB 12.8* 13.1  HCT 40.2 40.8  MCV 87.0 85.9  PLT 209 206   Basic Metabolic Panel: Recent Labs  Lab 08/25/18 1500 08/25/18 1636 08/25/18 2341  NA 141  --  141  K 2.4*  --  3.2*  CL 101  --  101  CO2 30  --  29  GLUCOSE 115*  --  95  BUN 8  --  5*  CREATININE 0.96  --  0.78  CALCIUM 8.8*  --  8.6*  MG  --  1.8 1.6*   GFR: CrCl cannot be calculated (Unknown ideal weight.). Liver Function Tests: Recent Labs  Lab 08/25/18 1500  AST 21  ALT 14  ALKPHOS 89  BILITOT 0.9  PROT 6.6  ALBUMIN 3.6   No results for input(s): LIPASE, AMYLASE in the last 168 hours. Recent Labs  Lab 08/25/18 1636 08/25/18 2341  AMMONIA 27 18   Coagulation Profile: No results for input(s): INR, PROTIME in the last 168 hours. Cardiac Enzymes: No results for input(s): CKTOTAL, CKMB, CKMBINDEX, TROPONINI in the last 168 hours. BNP (last 3 results) No results for input(s): PROBNP in the last 8760 hours. HbA1C: No results for input(s): HGBA1C in the last 72 hours. CBG: Recent Labs  Lab 08/25/18 1437 08/25/18 2339 08/26/18 0520  GLUCAP 121* 83 100*   Lipid Profile: No results for input(s): CHOL, HDL, LDLCALC, TRIG, CHOLHDL, LDLDIRECT in the last 72 hours. Thyroid Function Tests: Recent Labs    08/25/18 2341  TSH 4.981*   Anemia Panel: Recent Labs    08/25/18 1645 08/25/18 2341  VITAMINB12 569 572    Sepsis Labs: Recent Labs  Lab 08/25/18 1653  LATICACIDVEN 1.68    No results found for this or any previous visit (from the past 240 hour(s)).       Radiology Studies: Ct Head Wo Contrast  Result Date: 08/25/2018 CLINICAL DATA:  Infarct 2018 with change in mental status onset yesterday. EXAM: CT HEAD WITHOUT CONTRAST TECHNIQUE: Contiguous axial images were obtained from the base of the skull through the vertex without intravenous contrast. COMPARISON:  MRI and head CT 05/19/2018 FINDINGS: Brain: Chronic moderate small vessel ischemic disease of periventricular subcortical white matter with chronic left MCA territory infarct and encephalomalacia redemonstrated. No acute intraparenchymal hemorrhage, midline shift or edema. Sulcal and ventricular prominence is stable consistent with atrophy. Midline fourth ventricle basal cisterns without effacement. Brainstem and cerebellum appear intact. Vascular: Atherosclerosis at the skull base. No hyperdense vessel sign. Skull: Intact Sinuses/Orbits: Mild ethmoid sinus mucosal thickening. Intact orbits and globes. Other: Trace left mastoid effusion. IMPRESSION: Stable advanced chronic small-vessel ischemic disease  with cerebral atrophy. Chronic left MCA territory infarct with encephalomalacia. No acute intraparenchymal abnormality. Electronically Signed   By: Tollie Ethavid  Kwon M.D.   On: 08/25/2018 16:21   Dg Chest Port 1 View  Result Date: 08/25/2018 CLINICAL DATA:  Acute mental status change EXAM: PORTABLE CHEST 1 VIEW COMPARISON:  August 01, 2017 FINDINGS: The heart size and mediastinal contours are within normal limits. Both lungs are clear. The visualized skeletal structures are unremarkable. IMPRESSION: No active disease. Electronically Signed   By: Gerome Samavid  Williams III M.D   On: 08/25/2018 15:51        Scheduled Meds: . amLODipine  2.5 mg Oral Daily  . aspirin  325 mg Oral Daily  . atorvastatin  80 mg Oral q1800  . clopidogrel  75 mg Oral Daily   . folic acid  1 mg Oral Daily  . insulin aspart  0-5 Units Subcutaneous QHS  . insulin aspart  0-9 Units Subcutaneous TID WC  . linaclotide  145 mcg Oral QAC breakfast  . OLANZapine zydis  2.5 mg Oral Once  . OLANZapine zydis  5 mg Oral QHS  . thiamine  100 mg Oral Daily  . [START ON 09/01/2018] Vitamin D (Ergocalciferol)  50,000 Units Oral Q Mon   Continuous Infusions: . sodium chloride 100 mL/hr at 08/26/18 0553     LOS: 1 day    Time spent: 25 mins.More than 50% of that time was spent in counseling and/or coordination of care.      Burnadette PopAmrit Keyante Durio, MD [facility] Pager [phone]  If 7PM-7AM, please contact night-coverage [email or web address] Password [email or web address] 08/26/2018, 10:05 AM

## 2018-08-26 NOTE — ED Notes (Signed)
Pt highly agitated, attempting to get out of bed and pull at lines.

## 2018-08-26 NOTE — Progress Notes (Signed)
Pt admitted from ED with AMS, Pt confused and agitated, non verbal,do not follow command, settled in bed with bed alarm activated, iv haldol 2mg  given for agitation, no family at bedside. Tele monitor put and verified on pt, safety sitter at bedside ordered per protocol, will however continue to monitor. Obasogie-Asidi, Sue Mcalexander Efe

## 2018-08-26 NOTE — ED Notes (Signed)
Paged admitting, [name]

## 2018-08-26 NOTE — Consult Note (Signed)
[facility] Face-to-Face Psychiatry Consult   Reason for Consult:  Dementia with agitation  Referring Physician:  Dr. Renford Dills  Patient Identification: [name] MRN:  [id number] Principal Diagnosis: AMS (altered mental status) Diagnosis:  Principal Problem:   Altered mental state Active Problems:   HYPERTENSION, BENIGN ESSENTIAL   Acute CVA / Lt MCA 08/01/2017, Rt MCA 04/02/2017   DM (diabetes mellitus) (HCC)   Dyslipidemia   Hypokalemia   Dementia with behavioral disturbance (HCC)   AMS (altered mental status)   Total Time spent with patient: 1 hour  Subjective:   [name] is a 77 y.o. male patient admitted with altered mental status.  HPI:   Per chart review, patient was admitted with altered mental status from home. He lives with his daughter. He was at his baseline until 2 days ago. 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 He is mostly nonverbal but can ask for water and food. He was previously able to walk but has not been walking for 2 days. He has been increasingly weak and incontinent of bowel and bladder. He is refusing his medications and food. Home medications include Zyprexa 2.5 mg daily PRN and 5 mg qhs, Zoloft 100 mg daily and Trazodone 100 mg qhs. Brain MRI was unremarkable for acute findings. UA was within normal limits.   Of note, he was last seen by the psychiatry consult service on 8/27 for similar presentation of altered mental status which was thought secondary to delirium superimposed on dementia. He was started on Zyprexa for insomnia, poor appetite and agitation. He was discharged home with [facility].   [name] was seen in bed and appeared restless. He was in 3 point soft restraints with his right leg unrestrained. Nursing report that he has been agitated. He has been swinging at staff. He frequently tries to get out of bed when he is not restrained. He has received 11 mg of Haldol and 3 mg of Ativan  over the past 24 hours. His daughter, Lowella Dell was contacted by phone for collateral. She reports that over the past 2 days that her father has not been eating and has been more agitated. He has been disrobing and has been incontinent of urine and feces. She reports that he is intermittently agitated at baseline and usually responds well to his psychotropic medications. He has not been able to ambulate but usually is able to go for walks.   Past Psychiatric History: Dementia and depression.   Risk to Self:   UTA due to AMS/nonverbal.  Risk to Others:  UTA due to AMS/nonverbal.  Prior Inpatient Therapy:  Denies  Prior Outpatient Therapy:  His medications are managed by his PCP.   Past Medical History:  Past Medical History:  Diagnosis Date  . Diabetes (HCC)   . Dizziness   . Hypertension   . Stroke [facility])    x 2, last Nov 2018    Past Surgical History:  Procedure Laterality Date  . LAPAROSCOPIC APPENDECTOMY  02/21/2012   Procedure: APPENDECTOMY LAPAROSCOPIC;  Surgeon: Cherylynn Ridges, MD;  Location: [facility] OR;  Service: General;  Laterality: N/A;   Family History:  Family History  Problem Relation Age of Onset  . Diabetes Neg Hx   . Stroke Neg Hx   . Cancer Neg Hx    Family Psychiatric  History: Denies  Social History:  Social History   Substance and Sexual Activity  Alcohol Use No  Social History   Substance and Sexual Activity  Drug Use No    Social History   Socioeconomic History  . Marital status: Widowed    Spouse name: Not on file  . Number of children: Not on file  . Years of education: Not on file  . Highest education level: Not on file  Occupational History  . Not on file  Social Needs  . Financial resource strain: Not on file  . Food insecurity:    Worry: Not on file    Inability: Not on file  . Transportation needs:    Medical: Not on file    Non-medical: Not on file  Tobacco Use  . Smoking status: Never Smoker  . Smokeless tobacco: Never Used   Substance and Sexual Activity  . Alcohol use: No  . Drug use: No  . Sexual activity: Not on file  Lifestyle  . Physical activity:    Days per week: Not on file    Minutes per session: Not on file  . Stress: Not on file  Relationships  . Social connections:    Talks on phone: Not on file    Gets together: Not on file    Attends religious service: Not on file    Active member of club or organization: Not on file    Attends meetings of clubs or organizations: Not on file    Relationship status: Not on file  Other Topics Concern  . Not on file  Social History Narrative   Lives in apt with 2 daughters who care for him.  He drinks tea in am.     Additional Social History: He moved to the [place] in 2011 from [place]. He lives at home with his daughter and 45 y/o grandson. He has 4 adult daughters (one daughter is deceased). He is unemployed. He does not use alcohol or illicit drugs. 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     Allergies:  No Known Allergies  Labs:  Results for orders placed or performed during the hospital encounter of 08/25/18 (from the past 48 hour(s))  CBG monitoring, ED     Status: Abnormal   Collection Time: 08/25/18  2:37 PM  Result Value Ref Range   Glucose-Capillary 121 (H) 70 - 99 mg/dL   Comment 1 Notify RN    Comment 2 Document in Chart   Comprehensive metabolic panel     Status: Abnormal   Collection Time: 08/25/18  3:00 PM  Result Value Ref Range   Sodium 141 135 - 145 mmol/L   Potassium 2.4 (LL) 3.5 - 5.1 mmol/L    Comment: CRITICAL RESULT CALLED TO, READ BACK BY AND VERIFIED WITH: W HICKS,RN 1602 08/25/18 D BRADLEY    Chloride 101 98 - 111 mmol/L   CO2 30 22 - 32 mmol/L   Glucose, Bld 115 (H) 70 - 99 mg/dL   BUN 8 8 - 23 mg/dL   Creatinine, Ser 4.54 0.61 - 1.24 mg/dL   Calcium 8.8 (L) 8.9 - 10.3 mg/dL   Total Protein 6.6 6.5 - 8.1 g/dL   Albumin 3.6 3.5 - 5.0 g/dL   AST 21 15 - 41 U/L   ALT 14 0 - 44 U/L   Alkaline  Phosphatase 89 38 - 126 U/L   Total Bilirubin 0.9 0.3 - 1.2 mg/dL   GFR calc non Af Amer >60 >60 mL/min   GFR calc Af Amer >60 >60 mL/min   Anion  gap 10 5 - 15    Comment: Performed at [facility], [address] [place], [place] [address]  CBC     Status: Abnormal   Collection Time: 08/25/18  3:00 PM  Result Value Ref Range   WBC 7.0 4.0 - 10.5 K/uL   RBC 4.62 4.22 - 5.81 MIL/uL   Hemoglobin 12.8 (L) 13.0 - 17.0 g/dL   HCT 60.4 54.0 - 98.1 %   MCV 87.0 80.0 - 100.0 fL   MCH 27.7 26.0 - 34.0 pg   MCHC 31.8 30.0 - 36.0 g/dL   RDW 19.1 47.8 - 29.5 %   Platelets 209 150 - 400 K/uL   nRBC 0.0 0.0 - 0.2 %    Comment: Performed at [facility], [address], [place], [place] [address]  Ammonia     Status: None   Collection Time: 08/25/18  4:36 PM  Result Value Ref Range   Ammonia 27 9 - 35 umol/L    Comment: Performed at [facility], [address] [place], [place] [address]  Ethanol     Status: None   Collection Time: 08/25/18  4:36 PM  Result Value Ref Range   Alcohol, Ethyl (B) <10 <10 mg/dL    Comment: (NOTE) Lowest detectable limit for serum alcohol is 10 mg/dL. For medical purposes only. Performed at [facility], [address] [place], [place] [address]   TSH     Status: None   Collection Time: 08/25/18  4:36 PM  Result Value Ref Range   TSH 1.733 0.350 - 4.500 uIU/mL    Comment: Performed by a 3rd Generation assay with a functional sensitivity of <=0.01 uIU/mL. Performed at [facility], [address], [place], [place] [address]   Magnesium     Status: None   Collection Time: 08/25/18  4:36 PM  Result Value Ref Range   Magnesium 1.8 1.7 - 2.4 mg/dL    Comment: Performed at [facility], [address], [place], [place] [address]  I-stat troponin, ED     Status: None   Collection Time: 08/25/18  4:45 PM  Result Value Ref Range   Troponin i, poc 0.01 0.00 - 0.08 ng/mL   Comment 3            Comment: Due to the release  kinetics of cTnI, a negative result within the first hours of the onset of symptoms does not rule out myocardial infarction with certainty. If myocardial infarction is still suspected, repeat the test at appropriate intervals.   Vitamin B12     Status: None   Collection Time: 08/25/18  4:45 PM  Result Value Ref Range   Vitamin B-12 569 180 - 914 pg/mL    Comment: (NOTE) This assay is not validated for testing neonatal or myeloproliferative syndrome specimens for Vitamin B12 levels. Performed at [facility], [address] [place], [place] [address]   I-Stat venous blood gas, ED     Status: Abnormal   Collection Time: 08/25/18  4:48 PM  Result Value Ref Range   pH, Ven 7.405 7.250 - 7.430   pCO2, Ven 56.1 44.0 - 60.0 mmHg   pO2, Ven 26.0 (LL) 32.0 - 45.0 mmHg   Bicarbonate 35.2 (H) 20.0 - 28.0 mmol/L   TCO2 37 (H) 22 - 32 mmol/L   O2 Saturation 46.0 %   Acid-Base Excess 9.0 (H) 0.0 - 2.0 mmol/L   Patient temperature  HIDE    Sample type VENOUS    Comment NOTIFIED PHYSICIAN   I-Stat CG4 Lactic Acid, ED     Status: None   Collection Time: 08/25/18  4:53 PM  Result Value Ref Range   Lactic Acid, Venous 1.68 0.5 - 1.9 mmol/L  CBG monitoring, ED     Status: None   Collection Time: 08/25/18 11:39 PM  Result Value Ref Range   Glucose-Capillary 83 70 - 99 mg/dL  Magnesium     Status: Abnormal   Collection Time: 08/25/18 11:41 PM  Result Value Ref Range   Magnesium 1.6 (L) 1.7 - 2.4 mg/dL    Comment: Performed at [facility], [address], [place], [place] [address]  Basic metabolic panel     Status: Abnormal   Collection Time: 08/25/18 11:41 PM  Result Value Ref Range   Sodium 141 135 - 145 mmol/L   Potassium 3.2 (L) 3.5 - 5.1 mmol/L    Comment: DELTA CHECK NOTED   Chloride 101 98 - 111 mmol/L   CO2 29 22 - 32 mmol/L   Glucose, Bld 95 70 - 99 mg/dL   BUN 5 (L) 8 - 23 mg/dL   Creatinine, Ser 6.04 0.61 - 1.24 mg/dL   Calcium 8.6 (L) 8.9 - 10.3 mg/dL   GFR  calc non Af Amer >60 >60 mL/min   GFR calc Af Amer >60 >60 mL/min   Anion gap 11 5 - 15    Comment: Performed at [facility], [address] [place], [place] [address]  TSH     Status: Abnormal   Collection Time: 08/25/18 11:41 PM  Result Value Ref Range   TSH 4.981 (H) 0.350 - 4.500 uIU/mL    Comment: Performed by a 3rd Generation assay with a functional sensitivity of <=0.01 uIU/mL. Performed at [facility], [address] [place], [place] [address]   Ammonia     Status: None   Collection Time: 08/25/18 11:41 PM  Result Value Ref Range   Ammonia 18 9 - 35 umol/L    Comment: Performed at [facility], [address], [place], [place] [address]  Vitamin B12     Status: None   Collection Time: 08/25/18 11:41 PM  Result Value Ref Range   Vitamin B-12 572 180 - 914 pg/mL    Comment: (NOTE) This assay is not validated for testing neonatal or myeloproliferative syndrome specimens for Vitamin B12 levels. Performed at [facility], [address], [place], [place] [address]   Urinalysis, Routine w reflex microscopic (not at [facility])     Status: None   Collection Time: 08/25/18 11:48 PM  Result Value Ref Range   Color, Urine YELLOW YELLOW   APPearance CLEAR CLEAR   Specific Gravity, Urine 1.009 1.005 - 1.030   pH 7.0 5.0 - 8.0   Glucose, UA NEGATIVE NEGATIVE mg/dL   Hgb urine dipstick NEGATIVE NEGATIVE   Bilirubin Urine NEGATIVE NEGATIVE   Ketones, ur NEGATIVE NEGATIVE mg/dL   Protein, ur NEGATIVE NEGATIVE mg/dL   Nitrite NEGATIVE NEGATIVE   Leukocytes, UA NEGATIVE NEGATIVE    Comment: Performed at [facility], [address], [place], [place] [address]  CBC with Differential/Platelet     Status: Abnormal   Collection Time: 08/26/18  3:25 AM  Result Value Ref Range   WBC 7.7 4.0 - 10.5 K/uL   RBC 4.75 4.22 - 5.81 MIL/uL   Hemoglobin 13.1 13.0 - 17.0  g/dL   HCT 40.9 81.1 - 91.4 %   MCV 85.9 80.0 - 100.0 fL   MCH 27.6 26.0 - 34.0 pg   MCHC 32.1  30.0 - 36.0 g/dL   RDW 78.2 95.6 - 21.3 %   Platelets 206 150 - 400 K/uL   nRBC 0.0 0.0 - 0.2 %   Neutrophils Relative % 65 %   Neutro Abs 5.0 1.7 - 7.7 K/uL   Lymphocytes Relative 17 %   Lymphs Abs 1.3 0.7 - 4.0 K/uL   Monocytes Relative 15 %   Monocytes Absolute 1.1 (H) 0.1 - 1.0 K/uL   Eosinophils Relative 2 %   Eosinophils Absolute 0.2 0.0 - 0.5 K/uL   Basophils Relative 1 %   Basophils Absolute 0.0 0.0 - 0.1 K/uL   Immature Granulocytes 0 %   Abs Immature Granulocytes 0.03 0.00 - 0.07 K/uL    Comment: Performed at [facility], [address], [place], [place] [address]  CBG monitoring, ED     Status: Abnormal   Collection Time: 08/26/18  5:20 AM  Result Value Ref Range   Glucose-Capillary 100 (H) 70 - 99 mg/dL  Basic metabolic panel     Status: Abnormal   Collection Time: 08/26/18  9:49 AM  Result Value Ref Range   Sodium 138 135 - 145 mmol/L   Potassium 2.8 (L) 3.5 - 5.1 mmol/L   Chloride 100 98 - 111 mmol/L   CO2 26 22 - 32 mmol/L   Glucose, Bld 113 (H) 70 - 99 mg/dL   BUN <5 (L) 8 - 23 mg/dL   Creatinine, Ser 8.46 0.61 - 1.24 mg/dL   Calcium 8.8 (L) 8.9 - 10.3 mg/dL   GFR calc non Af Amer >60 >60 mL/min   GFR calc Af Amer >60 >60 mL/min   Anion gap 12 5 - 15    Comment: Performed at [facility], [address] [place], [place] [address]  Glucose, capillary     Status: None   Collection Time: 08/26/18  1:03 PM  Result Value Ref Range   Glucose-Capillary 95 70 - 99 mg/dL   Comment 1 Notify RN    Comment 2 Document in Chart     Current Facility-Administered Medications  Medication Dose Route Frequency Provider Last Rate Last Dose  . 0.9 %  sodium chloride infusion   Intravenous Continuous Burnadette Pop, MD 100 mL/hr at 08/26/18 0553    . albuterol (PROVENTIL) (2.5 MG/3ML) 0.083% nebulizer solution 3 mL  3 mL Inhalation Q6H PRN Adhikari, Amrit, MD      . amLODipine (NORVASC) tablet 2.5 mg  2.5 mg Oral Daily Adhikari, Amrit, MD   2.5 mg at 08/26/18  1037  . aspirin tablet 325 mg  325 mg Oral Daily Burnadette Pop, MD   325 mg at 08/26/18 1338  . atorvastatin (LIPITOR) tablet 80 mg  80 mg Oral q1800 Adhikari, Amrit, MD      . clopidogrel (PLAVIX) tablet 75 mg  75 mg Oral Daily Burnadette Pop, MD   75 mg at 08/26/18 1037  . folic acid (FOLVITE) tablet 1 mg  1 mg Oral Daily Adhikari, Amrit, MD   1 mg at 08/26/18 1037  . haloperidol lactate (HALDOL) injection 5 mg  5 mg Intravenous Q6H PRN Burnadette Pop, MD   5 mg at 08/26/18 1159  . insulin aspart (novoLOG) injection 0-5 Units  0-5 Units Subcutaneous QHS Burnadette Pop, MD      . insulin aspart (  novoLOG) injection 0-9 Units  0-9 Units Subcutaneous TID WC Adhikari, Amrit, MD      . labetalol (NORMODYNE,TRANDATE) injection 10 mg  10 mg Intravenous Q2H PRN Burnadette PopAdhikari, Amrit, MD      . linaclotide (LINZESS) capsule 145 mcg  145 mcg Oral QAC breakfast Burnadette PopAdhikari, Amrit, MD   145 mcg at 08/26/18 [id number]  . OLANZapine zydis (ZYPREXA) disintegrating tablet 2.5 mg  2.5 mg Oral Once Adhikari, Amrit, MD      . OLANZapine zydis (ZYPREXA) disintegrating tablet 5 mg  5 mg Oral QHS Adhikari, Amrit, MD      . thiamine (VITAMIN B-1) tablet 100 mg  100 mg Oral Daily Adhikari, Amrit, MD   100 mg at 08/26/18 1037  . [START ON 09/01/2018] Vitamin D (Ergocalciferol) (DRISDOL) capsule 50,000 Units  50,000 Units Oral Q Johney MaineMon Adhikari, Amrit, MD        Musculoskeletal: Strength & Muscle Tone: Generalized weakness with left residual hemiplegia. Gait & Station: unable to stand since in 3 point soft restraints.  Patient leans: N/A  Psychiatric Specialty Exam: Physical Exam  Nursing note and vitals reviewed. Constitutional: He appears well-developed.  Thin  HENT:  Head: Normocephalic and atraumatic.  Neck: Normal range of motion.  Respiratory: Effort normal.  Musculoskeletal: Normal range of motion.  Neurological: He is alert.  Psychiatric: His affect is labile. He is agitated. Cognition and memory are impaired. He  expresses impulsivity. He is noncommunicative.  Noncommunicative    Review of Systems  Unable to perform ROS: Mental status change    Blood pressure (!) 167/97, pulse 75, temperature (!) 97.5 F (36.4 C), temperature source Axillary, resp. rate 18, SpO2 100 %.There is no height or weight on file to calculate BMI.  General Appearance: Fairly Groomed, thin, elderly, [place] male, wearing a hospital gown and lying in bed in 3 point soft restraints. NAD.  Eye Contact:  Fair  Speech:  Nonverbal  Volume:  Nonverbal  Mood:  Dysphoric  Affect:  Congruent  Thought Process:  NA  Orientation:  Other:  Nonverbal  Thought Content:  Nonverbal  Suicidal Thoughts:  Nonverbal  Homicidal Thoughts:  Nonverbal  Memory:  Nonverbal  Judgement:  Impaired  Insight:  Lacking  Psychomotor Activity:  Increased  Concentration:  Concentration: Nonverbal and Attention Span: Nonverbal  Recall:  Nonverbal  Fund of Knowledge:  Nonverbal  Language:  Nonverbal  Akathisia:  NA  Handed:  Right  AIMS (if indicated):   N/A  Assets:  Housing Social Support  ADL's:  Impaired  Cognition:  Impaired due to AMS.  Sleep:   N/A   Assessment:  Allie Dimmerurna Mongiello is a 77 y.o. male who was admitted with altered mental status. He appears to have had an acute change in mental status over the past 2 days. He has been agitated, refusing food and medications and unable to ambulate. It appears that he was previously intermittently agitated but able to feed himself and ambulate. He is likely presenting with delirium superimposed on dementia although etiology is unknown. He had a similar presentation in August and improved with medication management. Recommend restarting home medications for agitation. He does not warrant inpatient psychiatric hospitalization at this time.   Treatment Plan Summary: -Restart home medications: Zoloft 100 mg daily for mood, Trazodone 100 mg qhs for insomnia and Zyprexa 2.5 mg q am and 5 mg qhs for  agitation.  -Can alternatively start Saphris 5 mg BID if Zyprexa if ineffective for agitation.  -Limit use of benzodiazepines given risk  of worsening confusion/mental status.  -EKG reviewed and QTc 421 on 12/2. Please closely monitor when starting or increasing QTc prolonging agents.  -Psychiatry will sign off on patient at this time. Please consult psychiatry again as needed.    Disposition: Patient does not meet criteria for psychiatric inpatient admission.  Cherly Beach, DO 08/26/2018 1:57 PM

## 2018-08-26 NOTE — Progress Notes (Signed)
Patient very combative, kicking and hitting staff. Not following commands. MD aware. Broke free from restraints per sitter, attempting to get out of bed. Able to pull on restraints and loosen ties.

## 2018-08-26 NOTE — Progress Notes (Signed)
  ELECTROENCEPHALOGRAM REPORT Date of Study:08/26/18  XBJ:[id number]   Clinical History: [name] is a [place]alese speaking [place] 77 y.o. male with medical history significant of 2 strokes (July and November 2018),  left residual hemiparesis, DM type 2, HTN, and hyperlipidemia who was brought from home by his daughter with complaints of altered mental status. Patient was acting well and doing his baseline activities until 2 days ago.  He has history of stroke and likely vascular dementia.  He is mostly nonverbal on baseline but can ask for water, eats his food, goes to the bathroom and walks well and he stopped  doing these 2 days ago.  Since last 2 days, he has been increasingly weak , lying on the bed most of the time, incontinent of bowel and bladder, refusing his meds and food. No AED  Technical Summary:  A multichannel digital EEG recording measured by the international 10-20 system with electrodes applied with paste and impedances below 5000 ohms performed in our laboratory with EKG monitoring in an awake and asleep patient.Hyperventilation and Photic stimulation were not performed. The digital EEG was referentially recorded, reformatted, and digitally filtered in a variety of bipolar and referential montages for optimal display.  Description:  The patient is awake and asleep during the recording. During maximal wakefulness, there is a symmetric, medium voltage 10 Hz posterior dominant rhythm that attenuates with eye opening. The record is symmetric. During drowsiness and sleep, there is an increase in theta slowing of the background. Vertex waves and symmetric sleep spindles were seen. At times EEG shows generalized bilateral slowing   There were no electrographic seizures seen.  EKG lead was unremarkable  Impression: This awake and asleep EEG is abnormal due togeneralized slowing but no electrographic seizures noted.

## 2018-08-27 DIAGNOSIS — I69398 Other sequelae of cerebral infarction: Secondary | ICD-10-CM | POA: Diagnosis not present

## 2018-08-27 DIAGNOSIS — Z515 Encounter for palliative care: Secondary | ICD-10-CM

## 2018-08-27 DIAGNOSIS — Z7189 Other specified counseling: Secondary | ICD-10-CM | POA: Diagnosis not present

## 2018-08-27 DIAGNOSIS — F0151 Vascular dementia with behavioral disturbance: Secondary | ICD-10-CM | POA: Diagnosis not present

## 2018-08-27 DIAGNOSIS — R4182 Altered mental status, unspecified: Secondary | ICD-10-CM | POA: Diagnosis not present

## 2018-08-27 LAB — BASIC METABOLIC PANEL
Anion gap: 12 (ref 5–15)
BUN: <5 mg/dL — ABNORMAL LOW (ref 8–23)
CO2: 28 mmol/L (ref 22–32)
CREATININE: 0.84 mg/dL (ref 0.61–1.24)
Calcium: 8.8 mg/dL — ABNORMAL LOW (ref 8.9–10.3)
Chloride: 101 mmol/L (ref 98–111)
GFR calc Af Amer: >60 mL/min (ref >60)
GFR calc non Af Amer: >60 mL/min (ref >60)
Glucose, Bld: 140 mg/dL — ABNORMAL HIGH (ref 70–99)
Potassium: 2.4 mmol/L — CL (ref 3.5–5.1)
Sodium: 141 mmol/L (ref 135–145)

## 2018-08-27 LAB — URINE CULTURE

## 2018-08-27 LAB — POTASSIUM: Potassium: 3.1 mmol/L — ABNORMAL LOW (ref 3.5–5.1)

## 2018-08-27 LAB — GLUCOSE, CAPILLARY
GLUCOSE-CAPILLARY: 126 mg/dL — AB (ref 70–99)
Glucose-Capillary: 81 mg/dL (ref 70–99)
Glucose-Capillary: 110 mg/dL — ABNORMAL HIGH (ref 70–99)
Glucose-Capillary: 100 mg/dL — ABNORMAL HIGH (ref 70–99)

## 2018-08-27 MED ORDER — POTASSIUM CHLORIDE CRYS ER 20 MEQ PO TBCR
40.0000 meq | EXTENDED_RELEASE_TABLET | Freq: Once | ORAL | Status: DC
  Filled 2018-08-27 (×2): qty 2

## 2018-08-27 MED ORDER — TRAZODONE HCL 100 MG PO TABS
100.0000 mg | ORAL_TABLET | Freq: Every day | ORAL | Status: DC
  Administered 2018-08-28: 100 mg via ORAL
  Filled 2018-08-27: qty 1

## 2018-08-27 MED ORDER — POTASSIUM CHLORIDE 10 MEQ/100ML IV SOLN
10.0000 meq | INTRAVENOUS | Status: AC
  Administered 2018-08-28 (×3): 10 meq via INTRAVENOUS
  Filled 2018-08-27 (×4): qty 100

## 2018-08-27 MED ORDER — SERTRALINE HCL 100 MG PO TABS
100.0000 mg | ORAL_TABLET | Freq: Every day | ORAL | Status: DC
  Administered 2018-08-28: 100 mg via ORAL
  Filled 2018-08-27 (×2): qty 1

## 2018-08-27 MED ORDER — POTASSIUM CHLORIDE 10 MEQ/100ML IV SOLN
10.0000 meq | INTRAVENOUS | Status: AC
  Administered 2018-08-27 (×4): 10 meq via INTRAVENOUS
  Filled 2018-08-27 (×4): qty 100

## 2018-08-27 MED ORDER — MAGNESIUM SULFATE 2 GM/50ML IV SOLN
2.0000 g | Freq: Once | INTRAVENOUS | Status: AC
  Administered 2018-08-27: 2 g via INTRAVENOUS
  Filled 2018-08-27: qty 50

## 2018-08-27 MED ORDER — POTASSIUM CHLORIDE 10 MEQ/100ML IV SOLN: 10.0000 meq | INTRAVENOUS | Status: DC

## 2018-08-27 MED ORDER — POTASSIUM CHLORIDE CRYS ER 20 MEQ PO TBCR: 40.0000 meq | EXTENDED_RELEASE_TABLET | Freq: Once | ORAL | Status: DC

## 2018-08-27 NOTE — Progress Notes (Signed)
PT Cancellation Note  Patient Details Name: [name] Eimer MRN: [id number] DOB: 06/15/1941   Cancelled Treatment:    Reason Eval/Treat Not Completed: Other (comment). Pt with bilateral wrist restraints and very agitated, swinging his arms at people and resisting nurse tech attempting to brush his teeth. Additionally, no family members present and the specific language that the pt speaks is not available throughout hospital's interpretation services.    Alessandra BevelsJennifer M Alvah Lagrow 08/27/2018, 11:57 AM

## 2018-08-27 NOTE — Consult Note (Signed)
Consultation Note Date: 08/27/2018   Patient Name: [name]  DOB: [birth year]  MRN: [id number]  Age / Sex: 77 y.o., male  PCP: Nolene Ebbs, MD Referring Physician: Shelly Coss, MD  Reason for Consultation: Establishing goals of care and Hospice Evaluation  HPI/Patient Profile: 77 y.o. 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 Patient admitted at the end of August with same symptoms. At baseline. PMT consulted for [place].    Clinical Assessment and Goals of Care: I have reviewed medical records including EPIC notes, labs and imaging, assessed the patient and then met with patient's daughter, Lynann Beaver,  to discuss diagnosis prognosis, GOC, EOL wishes, disposition and options.  I introduced Palliative Medicine as specialized medical care for people living with serious illness. It focuses on providing relief from the symptoms and stress of a serious illness. The goal is to improve quality of life for both the patient and the family.  We discussed a brief life review of the patient. She tells me he has lived in [place] for almost 9 years. The patient lives with her and her son. She tells me how much patient loves his grandson. Patient is widowed.  As far as functional and nutritional status, she tells me that patient can ambulate with assistance and a cane. He needs assistance for all ADLs. Since Sunday, patient has been significantly weaker than baseline. He has refused most PO intake since then as well. Occasional bite of food when offered by daughter. The only weight I could find in chart review was from 1 year ago post stroke - that weight was  144 pounds. He is currently 123 pounds - 21 pound weight loss in one year. Daughter tells me patient does not speak. She tells me he does not know who she is or any other  family members.    We discussed his current illness and what it means in the larger context of his on-going co-morbidities.  Natural disease trajectory and expectations at EOL were discussed. Daughter understands progressive nature of dementia.  I attempted to elicit values and goals of care important to the patient. Daughter thinks he wants to be home. She tells me "this is not him, it is only his body".    The difference between aggressive medical intervention and comfort care was considered in light of the patient's goals of care. Daughter agrees we should focus on patient's comfort and that aggressive medical care does not make sense for him.   Advance directives, concepts specific to code status, artifical feeding and hydration, and rehospitalization were considered and discussed. Daughter agrees patient should be DNR but wants to confirm with sisters before changing code status - we will confirm tomorrow morning.   Hospice and Palliative Care services outpatient were explained and offered. Daughter is interested in hospice support - discussed hospice philosophy of care.  Questions and concerns were addressed.  Hard Choices booklet left for review. The family was encouraged to call with questions or concerns.   Primary Decision Maker NEXT OF KIN - children    SUMMARY OF RECOMMENDATIONS    - patient with progression of dementia - poor PO intake, dependent in ADLs, non-verbal  - daughter interested in hospice support at home - order placed for case management - daughter thinks DNR is appropriate but wants to verify with sisters before placing order - will confirm with  her in AM - plan to meet again at 8 AM  Code Status/Advance Care Planning:  Full code - for now, daughter thinks DNR is appropriate but wants to confirm with sisters   Symptom Management:   Will defer symptom management to primary/psych  Palliative Prophylaxis:   Aspiration, Bowel Regimen, Delirium Protocol and  Frequent Pain Assessment  Additional Recommendations (Limitations, Scope, Preferences):  Avoid Hospitalization  Psycho-social/Spiritual:   Desire for further Chaplaincy support:no  Additional Recommendations: Education on Hospice  Prognosis:   < 6 months  Discharge Planning: Home with Hospice      Primary Diagnoses: Present on Admission: . HYPERTENSION, BENIGN ESSENTIAL . Acute CVA / Lt MCA 08/01/2017, Rt MCA 04/02/2017 . Altered mental state . Dyslipidemia . AMS (altered mental status)   I have reviewed the medical record, interviewed the patient and family, and examined the patient. The following aspects are pertinent.  Past Medical History:  Diagnosis Date  . Diabetes ([place])   . Dizziness   . Hypertension   . Stroke [facility])    x 2, last Nov 2018   Social History   Socioeconomic History  . Marital status: Widowed    Spouse name: Not on file  . Number of children: Not on file  . Years of education: Not on file  . Highest education level: Not on file  Occupational History  . Not on file  Social Needs  . Financial resource strain: Not on file  . Food insecurity:    Worry: Not on file    Inability: Not on file  . Transportation needs:    Medical: Not on file    Non-medical: Not on file  Tobacco Use  . Smoking status: Never Smoker  . Smokeless tobacco: Never Used  Substance and Sexual Activity  . Alcohol use: No  . Drug use: No  . Sexual activity: Not on file  Lifestyle  . Physical activity:    Days per week: Not on file    Minutes per session: Not on file  . Stress: Not on file  Relationships  . Social connections:    Talks on phone: Not on file    Gets together: Not on file    Attends religious service: Not on file    Active member of club or organization: Not on file    Attends meetings of clubs or organizations: Not on file    Relationship status: Not on file  Other Topics Concern  . Not on file  Social History Narrative   Lives in apt with  2 daughters who care for him.  He drinks tea in am.     Family History  Problem Relation Age of Onset  . Diabetes Neg Hx   . Stroke Neg Hx   . Cancer Neg Hx    Scheduled Meds: . amLODipine  2.5 mg Oral Daily  . aspirin  325 mg Oral Daily  . atorvastatin  80 mg Oral q1800  . clopidogrel  75 mg Oral Daily  . folic acid  1 mg Oral Daily  . insulin aspart  0-5 Units Subcutaneous QHS  . insulin aspart  0-9 Units Subcutaneous TID WC  . linaclotide  145 mcg Oral QAC breakfast  . OLANZapine  2.5 mg Oral q morning - 10a  . OLANZapine zydis  5 mg Oral QHS  . potassium chloride  40 mEq Oral Once  . sertraline  100 mg Oral Daily  . thiamine  100 mg Oral Daily  . traZODone  100 mg Oral QHS  . [START ON 09/01/2018] Vitamin D (Ergocalciferol)  50,000 Units Oral Q Mon   Continuous Infusions: . dextrose 5 % and 0.9% NaCl 75 mL/hr at 08/27/18 0400   PRN Meds:.albuterol, haloperidol lactate, labetalol No Known Allergies Review of Systems  Unable to perform ROS: Dementia    Physical Exam  Constitutional: He appears cachectic. No distress.  HENT:  Head: Normocephalic and atraumatic.  Cardiovascular: Normal rate and regular rhythm.  Pulmonary/Chest: Effort normal and breath sounds normal.  Abdominal: Soft.  Musculoskeletal:       Right lower leg: He exhibits no edema.       Left lower leg: He exhibits no edema.  Neurological: He is alert.  nonverbal  Skin: Skin is warm and dry. He is not diaphoretic.  Psychiatric: Cognition and memory are impaired.    Vital Signs: BP (!) 189/112 (BP Location: Left Arm)   Pulse 82   Temp 98.9 F (37.2 C) (Oral)   Resp 18   Wt 55.8 kg   SpO2 100%   BMI 21.79 kg/m  Pain Scale: 0-10   Pain Score: 0-No pain   SpO2: SpO2: 100 % O2 Device:SpO2: 100 % O2 Flow Rate: .   IO: Intake/output summary:   Intake/Output Summary (Last 24 hours) at 08/27/2018 1352 Last data filed at 08/27/2018 0400 Gross per 24 hour  Intake 720.21 ml  Output -  Net  720.21 ml    LBM: Last BM Date: 08/25/18 Baseline Weight: Weight: 55.8 kg Most recent weight: Weight: 55.8 kg     Palliative Assessment/Data: PPS 20% d/t PO intake    Time Total: 70 minutes Greater than 50%  of this time was spent counseling and coordinating care related to the above assessment and plan.  Juel Burrow, DNP, AGNP-C Palliative Medicine Team [phone] Pager: [phone]

## 2018-08-27 NOTE — Progress Notes (Signed)
CRITICAL VALUE ALERT  Critical Value: Potassium = 2.4  Date & Time Notied:  0613  Provider Notified: Merdis DelayK. Schorr  Orders Received/Actions taken:

## 2018-08-27 NOTE — Progress Notes (Signed)
PROGRESS NOTE    [name]  ZOX:[id number] DOB: 1941-05-05 DOA: 08/25/2018 PCP: Fleet Contras, MD   Brief Narrative: [name] is a Nepalese speaking Bhutanese 76 y.o. male with medical history significant of 2 strokes (July and November 2018), left residual hemiparesis, DM type 2,HTN, hyperlipidemia who was brought from home by his daughter with complaints of altered mental status.  Patient was acting well and doing his baseline activities until 2 days ago.  He has history of stroke and likely vascular dementia.  He is mostly nonverbal on baseline but can ask for water, eats his food, goes to the bathroom and walks well and he stopped  doing these 2 days ago.  Since last 2 days, he has been increasingly weak , lying on the bed most of the time, incontinent of bowel and bladder, refusing his meds and food. Patient admitted for further management.  Patient's hospital course was remarkable for persistent agitation, confusion.  Psychiatry also seen her during this hospitalization.  Assessment & Plan:   Principal Problem:   AMS (altered mental status) Active Problems:   HYPERTENSION, BENIGN ESSENTIAL   Acute CVA / Lt MCA 08/01/2017, Rt MCA 04/02/2017   DM (diabetes mellitus) (HCC)   Altered mental state   Dyslipidemia   Hypokalemia   Dementia with behavioral disturbance (HCC)   Altered mental status:  Multifactorial: Delirium on the background of his dementia/stroke, electrolyte abnormalities.We will continue to monitor his mental status.  CT head did not show any acute intracranial abnormalities but just showed chronic changes.  MRI also any acute intracranial abnormalities.  EEG did not show epileptiform activities. Has very poor oral intake.  History of stroke x2: With left residual hemiplegia, vascular dementia: Follows with neurology as an outpatient.  On Aspirin,Plavix and Lipitor which we will continue.  Patient has home health arranged.  He is ambulatory on baseline, mostly  nonverbal but asks for food and water, eats well.  PT ordered today.  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  Follow with psychiatry.  Persistent  agitation.  Continue Zyprexa.  Also reported on Zoloft and trazodone at home.  Psychiatry evaluated him. continue Haldol as needed for severe agitation.  Hypertension: On hypertensive urgency with blood pressure noted to be in the range of 180s/100s on presentation.  Continue labetalol PRN. Continue to monitor the blood pressure on telemetry.  Hypokalemia/hypomagnesemia:   Patient was reported to have incontinence of bowels but no mention of diarrhea at home.  Being supplemented.  Hyperlipidemia: Continue Lipitor  Diabetes: On oral medications at home.  We will continue sliding-scale insulin here.  Deconditioning/debility/end-stage dementia: If his overall condition and mental status does not improve, possible initiation of hospice at home.Will request for palliative care evaluation.       DVT prophylaxis: SCD Code Status: Full Family Communication: Daughter at the bedside Disposition Plan: Needs to be determined   Consultants: Psychiatry consult called  Procedures: None  Antimicrobials: None  Subjective: Patient seen and examined the bedside this morning.  Remained hemodynamically stable.  Less agitated today.  But has to be put on restraints.  Has very poor appetite.Speaks with mumbling only when he is agitated and angry.  Objective: Vitals:   08/26/18 2324 08/27/18 0307 08/27/18 0308 08/27/18 0731  BP: (!) 187/105 (!) 154/96 (!) 154/96 (!) 120/94  Pulse: 69 70 70 79  Resp: 14  16   Temp: (!) 97.5 F (36.4 C)  97.6 F (36.4 C) 98.9 F (37.2 C)  TempSrc: Oral  Oral Oral  SpO2: 100%  100% 100%  Weight:        Intake/Output Summary (Last 24 hours) at 08/27/2018 0954 Last data filed at 08/27/2018 0400 Gross per 24 hour  Intake 720.21 ml  Output -  Net 720.21 ml   Filed  Weights   08/26/18 1916  Weight: 55.8 kg    Examination:  General exam: Not in distress,thin built HEENT:PERRL,Oral mucosa moist, Ear/Nose normal on gross exam Respiratory system: Bilateral equal air entry, normal vesicular breath sounds, no wheezes or crackles  Cardiovascular system: S1 & S2 heard, RRR. No JVD, murmurs, rubs, gallops or clicks. No pedal edema. Gastrointestinal system: Abdomen is nondistended, soft and nontender. No organomegaly or masses felt. Normal bowel sounds heard. Central nervous system: Alert but not  oriented.  Full neurological exam cudnt be obtained  extremities: No edema, no clubbing ,no cyanosis, distal peripheral pulses palpable. Skin: No rashes, lesions or ulcers,no icterus ,no pallor Psychiatry: Judgement and insight appear impaired   Data Reviewed: I have personally reviewed following labs and imaging studies  CBC: Recent Labs  Lab 08/25/18 1500 08/26/18 0325  WBC 7.0 7.7  NEUTROABS  --  5.0  HGB 12.8* 13.1  HCT 40.2 40.8  MCV 87.0 85.9  PLT 209 206   Basic Metabolic Panel: Recent Labs  Lab 08/25/18 1500 08/25/18 1636 08/25/18 2341 08/26/18 0949 08/27/18 0432  NA 141  --  141 138 141  K 2.4*  --  3.2* 2.8* 2.4*  CL 101  --  101 100 101  CO2 30  --  29 26 28   GLUCOSE 115*  --  95 113* 140*  BUN 8  --  5* <5* <5*  CREATININE 0.96  --  0.78 0.94 0.84  CALCIUM 8.8*  --  8.6* 8.8* 8.8*  MG  --  1.8 1.6*  --   --    GFR: Estimated Creatinine Clearance: 59 mL/min (by C-G formula based on SCr of 0.84 mg/dL). Liver Function Tests: Recent Labs  Lab 08/25/18 1500  AST 21  ALT 14  ALKPHOS 89  BILITOT 0.9  PROT 6.6  ALBUMIN 3.6   No results for input(s): LIPASE, AMYLASE in the last 168 hours. Recent Labs  Lab 08/25/18 1636 08/25/18 2341  AMMONIA 27 18   Coagulation Profile: No results for input(s): INR, PROTIME in the last 168 hours. Cardiac Enzymes: No results for input(s): CKTOTAL, CKMB, CKMBINDEX, TROPONINI in the last  168 hours. BNP (last 3 results) No results for input(s): PROBNP in the last 8760 hours. HbA1C: No results for input(s): HGBA1C in the last 72 hours. CBG: Recent Labs  Lab 08/26/18 0520 08/26/18 1303 08/26/18 1559 08/26/18 2116 08/27/18 0618  GLUCAP 100* 95 93 118* 126*   Lipid Profile: No results for input(s): CHOL, HDL, LDLCALC, TRIG, CHOLHDL, LDLDIRECT in the last 72 hours. Thyroid Function Tests: Recent Labs    08/25/18 2341  TSH 4.981*   Anemia Panel: Recent Labs    08/25/18 1645 08/25/18 2341  VITAMINB12 569 572   Sepsis Labs: Recent Labs  Lab 08/25/18 1653  LATICACIDVEN 1.68    Recent Results (from the past 240 hour(s))  Urine culture     Status: None   Collection Time: 08/25/18 11:48 PM  Result Value Ref Range Status   Specimen Description URINE, RANDOM  Final   Special Requests   Final    NONE Performed at [facility], [address] [place], [place] [id number]  Culture   Final    Multiple bacterial morphotypes present, none predominant. Suggest appropriate recollection if clinically indicated.   Report Status 08/27/2018 FINAL  Final         Radiology Studies: Ct Head Wo Contrast  Result Date: 08/25/2018 CLINICAL DATA:  Infarct 2018 with change in mental status onset yesterday. EXAM: CT HEAD WITHOUT CONTRAST TECHNIQUE: Contiguous axial images were obtained from the base of the skull through the vertex without intravenous contrast. COMPARISON:  MRI and head CT 05/19/2018 FINDINGS: Brain: Chronic moderate small vessel ischemic disease of periventricular subcortical white matter with chronic left MCA territory infarct and encephalomalacia redemonstrated. No acute intraparenchymal hemorrhage, midline shift or edema. Sulcal and ventricular prominence is stable consistent with atrophy. Midline fourth ventricle basal cisterns without effacement. Brainstem and cerebellum appear intact. Vascular: Atherosclerosis at the skull base. No hyperdense vessel  sign. Skull: Intact Sinuses/Orbits: Mild ethmoid sinus mucosal thickening. Intact orbits and globes. Other: Trace left mastoid effusion. IMPRESSION: Stable advanced chronic small-vessel ischemic disease with cerebral atrophy. Chronic left MCA territory infarct with encephalomalacia. No acute intraparenchymal abnormality. Electronically Signed   By: Tollie Eth M.D.   On: 08/25/2018 16:21   Mr Brain Wo Contrast  Result Date: 08/26/2018 CLINICAL DATA:  Altered level of consciousness EXAM: MRI HEAD WITHOUT CONTRAST TECHNIQUE: Multiplanar, multiecho pulse sequences of the brain and surrounding structures were obtained without intravenous contrast. COMPARISON:  05/19/2018 truncated brain MRI. Head CT from yesterday. FINDINGS: Brain: No acute infarction, acute hemorrhage, hydrocephalus, extra-axial collection or mass lesion. Remote inferior division left MCA territory infarct with cortical laminar necrosis. Patchy remote right cerebral white matter infarction aligned along the deep watershed, with wallerian changes seen into the brainstem. Chronic small vessel ischemia with confluent gliosis in the cerebral white matter. Vascular: Major flow voids are preserved Skull and upper cervical spine: No evidence of marrow lesion Sinuses/Orbits: Negative IMPRESSION: 1. No acute finding. 2. Remote left inferior division MCA territory infarct. 3. Remote patchy right cerebral white matter infarction along the deep watershed. 4. Advanced chronic small vessel ischemia. Electronically Signed   By: Marnee Spring M.D.   On: 08/26/2018 12:49   Dg Chest Port 1 View  Result Date: 08/25/2018 CLINICAL DATA:  Acute mental status change EXAM: PORTABLE CHEST 1 VIEW COMPARISON:  August 01, 2017 FINDINGS: The heart size and mediastinal contours are within normal limits. Both lungs are clear. The visualized skeletal structures are unremarkable. IMPRESSION: No active disease. Electronically Signed   By: Gerome Sam III M.D   On:  08/25/2018 15:51        Scheduled Meds: . amLODipine  2.5 mg Oral Daily  . aspirin  325 mg Oral Daily  . atorvastatin  80 mg Oral q1800  . clopidogrel  75 mg Oral Daily  . folic acid  1 mg Oral Daily  . insulin aspart  0-5 Units Subcutaneous QHS  . insulin aspart  0-9 Units Subcutaneous TID WC  . linaclotide  145 mcg Oral QAC breakfast  . OLANZapine  2.5 mg Oral q morning - 10a  . OLANZapine zydis  5 mg Oral QHS  . potassium chloride  40 mEq Oral Once  . sertraline  100 mg Oral Daily  . thiamine  100 mg Oral Daily  . traZODone  100 mg Oral QHS  . [START ON 09/01/2018] Vitamin D (Ergocalciferol)  50,000 Units Oral Q Mon   Continuous Infusions: . dextrose 5 % and 0.9% NaCl 75 mL/hr at 08/27/18 0400  . potassium chloride  10 mEq (08/27/18 0701)     LOS: 2 days    Time spent: 25 mins.More than 50% of that time was spent in counseling and/or coordination of care.      Burnadette Pop, MD [facility] Pager [phone]  If 7PM-7AM, please contact night-coverage [email or web address] Password [email or web address] 08/27/2018, 9:54 AM

## 2018-08-28 DIAGNOSIS — R4182 Altered mental status, unspecified: Secondary | ICD-10-CM | POA: Diagnosis not present

## 2018-08-28 DIAGNOSIS — Z7189 Other specified counseling: Secondary | ICD-10-CM | POA: Diagnosis not present

## 2018-08-28 DIAGNOSIS — F0151 Vascular dementia with behavioral disturbance: Secondary | ICD-10-CM | POA: Diagnosis not present

## 2018-08-28 DIAGNOSIS — I69398 Other sequelae of cerebral infarction: Secondary | ICD-10-CM | POA: Diagnosis not present

## 2018-08-28 DIAGNOSIS — Z515 Encounter for palliative care: Secondary | ICD-10-CM | POA: Diagnosis not present

## 2018-08-28 LAB — GLUCOSE, CAPILLARY
Glucose-Capillary: 122 mg/dL — ABNORMAL HIGH (ref 70–99)
Glucose-Capillary: 99 mg/dL (ref 70–99)
Glucose-Capillary: 106 mg/dL — ABNORMAL HIGH (ref 70–99)
Glucose-Capillary: 105 mg/dL — ABNORMAL HIGH (ref 70–99)

## 2018-08-28 LAB — BASIC METABOLIC PANEL
Anion gap: 12 (ref 5–15)
BUN: <5 mg/dL — ABNORMAL LOW (ref 8–23)
CHLORIDE: 102 mmol/L (ref 98–111)
CO2: 26 mmol/L (ref 22–32)
Calcium: 9.2 mg/dL (ref 8.9–10.3)
Creatinine, Ser: 0.92 mg/dL (ref 0.61–1.24)
GFR calc Af Amer: >60 mL/min (ref >60)
GFR calc non Af Amer: >60 mL/min (ref >60)
Glucose, Bld: 145 mg/dL — ABNORMAL HIGH (ref 70–99)
POTASSIUM: 3.8 mmol/L (ref 3.5–5.1)
Sodium: 140 mmol/L (ref 135–145)

## 2018-08-28 MED ORDER — TRAZODONE HCL 100 MG PO TABS: 100.0000 mg | ORAL_TABLET | Freq: Every day | ORAL | 0 refills | Status: AC

## 2018-08-28 MED ORDER — METFORMIN HCL 500 MG PO TABS: 500.0000 mg | ORAL_TABLET | Freq: Two times a day (BID) | ORAL | 0 refills | Status: AC

## 2018-08-28 MED ORDER — POTASSIUM CHLORIDE CRYS ER 20 MEQ PO TBCR: 40.0000 meq | EXTENDED_RELEASE_TABLET | Freq: Every day | ORAL | 0 refills | Status: AC

## 2018-08-28 MED ORDER — CLOPIDOGREL BISULFATE 75 MG PO TABS: 75.0000 mg | ORAL_TABLET | Freq: Every day | ORAL | 0 refills | Status: AC

## 2018-08-28 MED ORDER — SERTRALINE HCL 100 MG PO TABS: 100.0000 mg | ORAL_TABLET | Freq: Every day | ORAL | 2 refills | Status: AC

## 2018-08-28 MED ORDER — OLANZAPINE 5 MG PO TBDP: 5.0000 mg | ORAL_TABLET | Freq: Every day | ORAL | 0 refills | Status: AC

## 2018-08-28 MED ORDER — VITAMIN D3 1.25 MG (50000 UT) PO CAPS: 1.0000 | ORAL_CAPSULE | ORAL | 0 refills | Status: AC

## 2018-08-28 MED ORDER — FOLIC ACID 1 MG PO TABS: 1.0000 mg | ORAL_TABLET | Freq: Every day | ORAL | 0 refills | Status: AC

## 2018-08-28 MED ORDER — ASPIRIN 325 MG PO TBEC: 325.0000 mg | DELAYED_RELEASE_TABLET | Freq: Every day | ORAL | 0 refills | Status: AC

## 2018-08-28 MED ORDER — AMLODIPINE BESYLATE 10 MG PO TABS
10.0000 mg | ORAL_TABLET | Freq: Every day | ORAL | Status: DC
  Administered 2018-08-28: 10 mg via ORAL

## 2018-08-28 MED ORDER — AMLODIPINE BESYLATE 10 MG PO TABS: 10.0000 mg | ORAL_TABLET | Freq: Every day | ORAL | 0 refills | Status: AC

## 2018-08-28 MED ORDER — ATORVASTATIN CALCIUM 80 MG PO TABS: 80.0000 mg | ORAL_TABLET | Freq: Every day | ORAL | 0 refills | Status: AC

## 2018-08-28 MED ORDER — THIAMINE HCL 100 MG PO TABS: 100.0000 mg | ORAL_TABLET | Freq: Every day | ORAL | 0 refills | Status: AC

## 2018-08-28 MED ORDER — OLANZAPINE 5 MG PO TBDP: 2.5000 mg | ORAL_TABLET | Freq: Every morning | ORAL | 0 refills | Status: AC

## 2018-08-28 MED ORDER — POTASSIUM CHLORIDE 10 MEQ/100ML IV SOLN
10.0000 meq | Freq: Once | INTRAVENOUS | Status: AC
  Administered 2018-08-28: 10 meq via INTRAVENOUS
  Filled 2018-08-28: qty 100

## 2018-08-28 NOTE — Care Management Note (Addendum)
Case Management Note  Patient Details  Name: [name] MRN: [id number] Date of Birth: Dec 22, 1940  Subjective/Objective:     Pt admitted with altered mental status. He is from home with his daughter.  DME: bed, walker, wheelchair, 3 in 1.  No issues obtaining his medications. Daughter provides transportation.              Action/Plan: CM consulted for hospice at home. CM met with the daughter and provided her choice. [facility] selected. Bambi with [facility] notified and will be up to meet with the patient and daughter. CM following.  Addendum (1400):: pt discharging home with hospice care. [facility] arranged for transport home with a pick up at 4:30. Transport form on the chart and bedside RN aware.   Expected Discharge Date:                  Expected Discharge Plan:  Home w Hospice Care  In-House Referral:     Discharge planning Services  CM Consult  Post Acute Care Choice:    Choice offered to:     DME Arranged:    DME Agency:     HH Arranged:    [place] Agency:     Status of Service:  In process, will continue to follow  If discussed at Long Length of Stay Meetings, dates discussed:    Additional Comments:  Pollie Friar, RN 08/28/2018, 11:36 AM

## 2018-08-28 NOTE — Care Management Important Message (Signed)
Important Message  Patient Details  Name: Allie Dimmerurna Wrinkle MRN: [id number] Date of Birth: 05/24/1941   Medicare Important Message Given:  Yes    Alayja Armas Stefan [place] 08/28/2018, 2:39 PM

## 2018-08-28 NOTE — Progress Notes (Signed)
PROGRESS NOTE    [name]  XBM:[id number] DOB: Feb 14, 1941 DOA: 08/25/2018 PCP: Fleet Contras, MD   Brief Narrative: [name] is a Nepalese speaking [place] 77 y.o. male with medical history significant of 2 strokes (July and November 2018), left residual hemiparesis, DM type 2,HTN, hyperlipidemia who was brought from home by his daughter with complaints of altered mental status.  Patient was acting well and doing his baseline activities until 2 days ago.  He has history of stroke and likely vascular dementia.  He is mostly nonverbal on baseline but can ask for water, eats his food, goes to the bathroom and walks well and he stopped  doing these 2 days ago.  Since last 2 days, he has been increasingly weak , lying on the bed most of the time, incontinent of bowel and bladder, refusing his meds and food. Patient admitted for further management.  Patient's hospital course was remarkable for persistent agitation, confusion.  Psychiatry also seen him during this hospitalization.  He is more calm and cooperative today.  Plan is to discharge him to home with hospice. Case manager consulted.  Assessment & Plan:   Principal Problem:   AMS (altered mental status) Active Problems:   HYPERTENSION, BENIGN ESSENTIAL   Acute CVA / Lt MCA 08/01/2017, Rt MCA 04/02/2017   DM (diabetes mellitus) (HCC)   Altered mental state   Dyslipidemia   Hypokalemia   Dementia with behavioral disturbance (HCC)   Goals of care, counseling/discussion   Palliative care by specialist   Altered mental status:Much improved .More calm and cooperative.AMS is   Multifactorial: Delirium on the background of his dementia/stroke, electrolyte abnormalities.We will continue to monitor his mental status.  CT head did not show any acute intracranial abnormalities but just showed chronic changes.  MRI also any acute intracranial abnormalities.  EEG did not show epileptiform activities. Has very poor oral intake.  History of  stroke x2: With left residual hemiplegia, vascular dementia: Follows with neurology as an outpatient.  On Aspirin,Plavix and Lipitor which we will continue.  Patient has home health arranged.  He was ambulatory on baseline, mostly nonverbal but asks for food and water, eats well.  PT ordered and community skilled nursing facility but family wants to take him home with hospice.  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  Follow with psychiatry.  Persistent  agitation.  Continue Zyprexa.  Also reported on Zoloft and trazodone at home.  Psychiatry evaluated him. continue Haldol as needed for severe agitation.  Hypertension: On hypertensive urgency with blood pressure noted to be in the range of 180s/100s on presentation.  Continue labetalol PRN.Started on amlodipine 10 mg daily Continue to monitor the blood pressure on telemetry.  Hypokalemia/hypomagnesemia:   Patient was reported to have incontinence of bowels but no mention of diarrhea at home.  Being supplemented.  Hyperlipidemia: Continue Lipitor  Diabetes: On oral medications at home.  We will continue sliding-scale insulin here.  Deconditioning/debility/end-stage dementia: Plan is for initiation of hospice at home. palliative care evaluation done. Code status changed to DNR  Patient is hemodynamically stable to be discharged home with hospice as soon as it is arranged.No plan for further work-up.       DVT prophylaxis: SCD Code Status: Full Family Communication: Daughter at the bedside Disposition Plan: Home with hospice  Consultants: Psychiatry   Procedures: None  Antimicrobials: None  Subjective: Patient seen and examined the bedside this morning.  Remained hemodynamically stable.  Less agitated today and  more cooperative.  Started eating  Objective: Vitals:   08/27/18 2020 08/28/18 0343 08/28/18 0700 08/28/18 0744  BP: (!) 151/93 (!) 185/100 (!) 193/112 (!) 163/97  Pulse:  85 81 97 72  Resp: 19 19  18   Temp: 98.6 F (37 C) 97.7 F (36.5 C)    TempSrc: Oral Oral    SpO2: 97% 100%  100%  Weight:        Intake/Output Summary (Last 24 hours) at 08/28/2018 1007 Last data filed at 08/28/2018 0400 Gross per 24 hour  Intake 1050.94 ml  Output -  Net 1050.94 ml   Filed Weights   08/26/18 1916  Weight: 55.8 kg    Examination:  General exam: Not in distress,thin built HEENT:PERRL,Oral mucosa moist, Ear/Nose normal on gross exam Respiratory system: Bilateral equal air entry, normal vesicular breath sounds, no wheezes or crackles  Cardiovascular system: S1 & S2 heard, RRR. No JVD, murmurs, rubs, gallops or clicks. No pedal edema. Gastrointestinal system: Abdomen is nondistended, soft and nontender. No organomegaly or masses felt. Normal bowel sounds heard. Central nervous system: Alert but not  oriented.  Full neurological exam cudnt be obtained  extremities: No edema, no clubbing ,no cyanosis, distal peripheral pulses palpable. Skin: No rashes, lesions or ulcers,no icterus ,no pallor Psychiatry: Judgement and insight appear impaired   Data Reviewed: I have personally reviewed following labs and imaging studies  CBC: Recent Labs  Lab 08/25/18 1500 08/26/18 0325  WBC 7.0 7.7  NEUTROABS  --  5.0  HGB 12.8* 13.1  HCT 40.2 40.8  MCV 87.0 85.9  PLT 209 206   Basic Metabolic Panel: Recent Labs  Lab 08/25/18 1500 08/25/18 1636 08/25/18 2341 08/26/18 0949 08/27/18 0432 08/27/18 1417 08/28/18 0722  NA 141  --  141 138 141  --  140  K 2.4*  --  3.2* 2.8* 2.4* 3.1* 3.8  CL 101  --  101 100 101  --  102  CO2 30  --  29 26 28   --  26  GLUCOSE 115*  --  95 113* 140*  --  145*  BUN 8  --  5* <5* <5*  --  <5*  CREATININE 0.96  --  0.78 0.94 0.84  --  0.92  CALCIUM 8.8*  --  8.6* 8.8* 8.8*  --  9.2  MG  --  1.8 1.6*  --   --   --   --    GFR: Estimated Creatinine Clearance: 53.9 mL/min (by C-G formula based on SCr of 0.92 mg/dL). Liver Function  Tests: Recent Labs  Lab 08/25/18 1500  AST 21  ALT 14  ALKPHOS 89  BILITOT 0.9  PROT 6.6  ALBUMIN 3.6   No results for input(s): LIPASE, AMYLASE in the last 168 hours. Recent Labs  Lab 08/25/18 1636 08/25/18 2341  AMMONIA 27 18   Coagulation Profile: No results for input(s): INR, PROTIME in the last 168 hours. Cardiac Enzymes: No results for input(s): CKTOTAL, CKMB, CKMBINDEX, TROPONINI in the last 168 hours. BNP (last 3 results) No results for input(s): PROBNP in the last 8760 hours. HbA1C: No results for input(s): HGBA1C in the last 72 hours. CBG: Recent Labs  Lab 08/27/18 0618 08/27/18 1140 08/27/18 1720 08/27/18 2150 08/28/18 0611  GLUCAP 126* 81 110* 100* 122*   Lipid Profile: No results for input(s): CHOL, HDL, LDLCALC, TRIG, CHOLHDL, LDLDIRECT in the last 72 hours. Thyroid Function Tests: Recent Labs    08/25/18 2341  TSH 4.981*   Anemia  Panel: Recent Labs    08/25/18 1645 08/25/18 2341  VITAMINB12 569 572   Sepsis Labs: Recent Labs  Lab 08/25/18 1653  LATICACIDVEN 1.68    Recent Results (from the past 240 hour(s))  Urine culture     Status: None   Collection Time: 08/25/18 11:48 PM  Result Value Ref Range Status   Specimen Description URINE, RANDOM  Final   Special Requests   Final    NONE Performed at [facility], [address], [place], [place] [address]    Culture   Final    Multiple bacterial morphotypes present, none predominant. Suggest appropriate recollection if clinically indicated.   Report Status 08/27/2018 FINAL  Final         Radiology Studies: Mr Brain Wo Contrast  Result Date: 08/26/2018 CLINICAL DATA:  Altered level of consciousness EXAM: MRI HEAD WITHOUT CONTRAST TECHNIQUE: Multiplanar, multiecho pulse sequences of the brain and surrounding structures were obtained without intravenous contrast. COMPARISON:  05/19/2018 truncated brain MRI. Head CT from yesterday. FINDINGS: Brain: No acute infarction, acute  hemorrhage, hydrocephalus, extra-axial collection or mass lesion. Remote inferior division left MCA territory infarct with cortical laminar necrosis. Patchy remote right cerebral white matter infarction aligned along the deep watershed, with wallerian changes seen into the brainstem. Chronic small vessel ischemia with confluent gliosis in the cerebral white matter. Vascular: Major flow voids are preserved Skull and upper cervical spine: No evidence of marrow lesion Sinuses/Orbits: Negative IMPRESSION: 1. No acute finding. 2. Remote left inferior division MCA territory infarct. 3. Remote patchy right cerebral white matter infarction along the deep watershed. 4. Advanced chronic small vessel ischemia. Electronically Signed   By: Marnee Spring M.D.   On: 08/26/2018 12:49        Scheduled Meds: . amLODipine  10 mg Oral Daily  . aspirin  325 mg Oral Daily  . atorvastatin  80 mg Oral q1800  . clopidogrel  75 mg Oral Daily  . folic acid  1 mg Oral Daily  . insulin aspart  0-5 Units Subcutaneous QHS  . insulin aspart  0-9 Units Subcutaneous TID WC  . linaclotide  145 mcg Oral QAC breakfast  . OLANZapine  2.5 mg Oral q morning - 10a  . OLANZapine zydis  5 mg Oral QHS  . potassium chloride  40 mEq Oral Once  . sertraline  100 mg Oral Daily  . thiamine  100 mg Oral Daily  . traZODone  100 mg Oral QHS  . [START ON 09/01/2018] Vitamin D (Ergocalciferol)  50,000 Units Oral Q Mon   Continuous Infusions: . dextrose 5 % and 0.9% NaCl Stopped (08/28/18 0310)     LOS: 3 days    Time spent: 25 mins.More than 50% of that time was spent in counseling and/or coordination of care.      Burnadette Pop, MD [facility] Pager [phone]  If 7PM-7AM, please contact night-coverage [email or web address] Password [email or web address] 08/28/2018, 10:07 AM

## 2018-08-28 NOTE — Evaluation (Signed)
Physical Therapy Evaluation Patient Details Name: [name] MRN: [id number] DOB: 1941/08/09 Today's Date: 08/28/2018   History of Present Illness  Pt is a 77 y/o male admitted secondary to AMS of unknown etiology. PMH including but not limited to dementia, 2 strokes (July and November 2018), DM and HTN.    Clinical Impression  Pt present supine in bed with bilateral wrist restraints, sitter and daughter present. Daughter remained present throughout and assisted with translating as pt's preferred language is not available through the hospital's interpretive services. Prior to admission, pt's daughter reported that he required assistance with ADLs and ambulated with use of a cane. She is at home with him 24/7. Pt currently able to perform bed mobility with supervision and transfers with min-mod A x2 for safety and stability. Pt initially agitated with restraints but did not demonstrate anymore aggressive behaviors or agitation for the remainder of session. Pt would continue to benefit from skilled physical therapy services at this time while admitted and after d/c to address the below listed limitations in order to improve overall safety and independence with functional mobility.  Pt's BP in sitting was 140/114 mmHg. RN in room and aware.    Follow Up Recommendations SNF    Equipment Recommendations  None recommended by PT    Recommendations for Other Services       Precautions / Restrictions Precautions Precautions: Fall Precaution Comments: restraints Restrictions Weight Bearing Restrictions: No      Mobility  Bed Mobility Overal bed mobility: Needs Assistance Bed Mobility: Supine to Sit     Supine to sit: Supervision     General bed mobility comments: supervision for safety as pt impulsive and restless  Transfers Overall transfer level: Needs assistance Equipment used: 1 person hand held assist Transfers: Sit to/from Stand;Stand Pivot Transfers Sit to Stand: Min  assist;+2 safety/equipment Stand pivot transfers: Mod assist;+2 safety/equipment       General transfer comment: increased time and effort, multimodal cueing throughout, assistance to power into standing from EOB and for stability and support with pivotal movements to chair  Ambulation/Gait                Stairs            Wheelchair Mobility    Modified Rankin (Stroke Patients Only) Modified Rankin (Stroke Patients Only) Pre-Morbid Rankin Score: Moderately severe disability Modified Rankin: Severe disability     Balance Overall balance assessment: Needs assistance Sitting-balance support: Feet supported Sitting balance-Leahy Scale: Fair Sitting balance - Comments: pt able to sit EOB with close min guard for safety   Standing balance support: Bilateral upper extremity supported;Single extremity supported Standing balance-Leahy Scale: Poor                               Pertinent Vitals/Pain Pain Assessment: Faces Faces Pain Scale: No hurt    Home Living Family/patient expects to be discharged to:: Private residence Living Arrangements: Children Available Help at Discharge: Family;Available 24 hours/day Type of Home: House Home Access: Level entry     Home Layout: One level Home Equipment: Cane - single point;Wheelchair - manual      Prior Function Level of Independence: Needs assistance   Gait / Transfers Assistance Needed: ambulates with use of cane  ADL's / Homemaking Assistance Needed: requires assistance with all ADLs        Hand Dominance   Dominant Hand: Left    Extremity/Trunk Assessment  Upper Extremity Assessment Upper Extremity Assessment: Generalized weakness    Lower Extremity Assessment Lower Extremity Assessment: Generalized weakness       Communication   Communication: Prefers language other than English;Expressive difficulties;Receptive difficulties  Cognition Arousal/Alertness: Awake/alert Behavior  During Therapy: Restless Overall Cognitive Status: Difficult to assess                                 General Comments: pt's daughter present and assisting with translating as the hospital does not have the specific language the pt speaks through the interpretation services      General Comments      Exercises     Assessment/Plan    PT Assessment Patient needs continued PT services  PT Problem List Decreased strength;Decreased balance;Decreased mobility;Decreased coordination;Decreased cognition;Decreased knowledge of use of DME;Decreased safety awareness;Decreased knowledge of precautions       PT Treatment Interventions DME instruction;Gait training;Stair training;Functional mobility training;Therapeutic activities;Neuromuscular re-education;Therapeutic exercise;Balance training;Cognitive remediation;Patient/family education    PT Goals (Current goals can be found in the Care Plan section)  Acute Rehab PT Goals Patient Stated Goal: unable to state PT Goal Formulation: With family Time For Goal Achievement: 09/11/18 Potential to Achieve Goals: Fair    Frequency Min 2X/week   Barriers to discharge        Co-evaluation               AM-PAC PT "6 Clicks" Mobility  Outcome Measure Help needed turning from your back to your side while in a flat bed without using bedrails?: None Help needed moving from lying on your back to sitting on the side of a flat bed without using bedrails?: None Help needed moving to and from a bed to a chair (including a wheelchair)?: A Lot Help needed standing up from a chair using your arms (e.g., wheelchair or bedside chair)?: A Lot Help needed to walk in hospital room?: A Lot Help needed climbing 3-5 steps with a railing? : Total 6 Click Score: 15    End of Session   Activity Tolerance: Patient tolerated treatment well Patient left: in chair;with call bell/phone within reach;with family/visitor present;with restraints  reapplied;with nursing/sitter in room Nurse Communication: Mobility status PT Visit Diagnosis: Other abnormalities of gait and mobility (R26.89);Muscle weakness (generalized) (M62.81)    Time: [id number] PT Time Calculation (min) (ACUTE ONLY): 33 min   Charges:   PT Evaluation $PT Eval Moderate Complexity: 1 Mod PT Treatments $Therapeutic Activity: 8-22 mins        Deborah ChalkJennifer Nyal Schachter, PT, DPT  Acute Rehabilitation Services Pager [phone] Office [phone]    Alessandra BevelsJennifer M Ido Wollman 08/28/2018, 9:31 AM

## 2018-08-28 NOTE — Plan of Care (Signed)
Patient stable, discussed POC with patient and daughter, agreeable with plan, denies question/concerns at this time.  

## 2018-08-28 NOTE — Discharge Summary (Signed)
Physician Discharge Summary  [name] ZOX:[id number] DOB: 05/08/1941 DOA: 08/25/2018  PCP: Bradley Contras, MD  Admit date: 08/25/2018 Discharge date: 08/28/2018  Admitted From: Home Disposition:  Home with Hospcie  Discharge Condition:Stable CODE STATUS: DNR Diet recommendation: Heart Healthy  Brief/Interim Summary: [name] a Nepalese speaking [place] 77 y.o.malewith medical history significant of2 strokes(July and November 2018),left residual hemiparesis, DM type2,HTN,hyperlipidemia who was brought from home by his daughter with complaints of altered mental status. Patient was acting well and doing his baseline activities until 2 days ago. He has history of stroke and likely vascular dementia. He is mostly nonverbal on baseline but can ask for water, eats his food, goes to the bathroom and walks welland hestoppeddoing these 2 days ago. Since last 2 days, he has been increasingly weak ,lying on the bed most of the time, incontinentof bowel and bladder,refusing his meds and food. Patient admitted for further management.  Patient's hospital course was remarkable for persistent agitation, confusion.  Psychiatry also seen him during this hospitalization.  He is more calm and cooperative today.  Palliative care consulted for discussion of goals of care regarding his illnesses dementia and persistent hospitalization/agitation.  Decision made to initiate hospice at home.   Plan for discharge today.  Following problems were addressed during his hospitalization:    Altered mental status:Much improved .More calm and cooperative.AMS is  multifactorial:Delirium on the background of his dementia/stroke, electrolyte abnormalities.We will continue to monitor his mental status. CT head did not show any acute intracranial abnormalities but just showed chronic changes. MRI also any acute intracranial abnormalities.  EEG did not show epileptiform activities. He has  poor oral  intake.  History of stroke x2: With left residual hemiplegia,vascular dementia: Follows with neurology as an outpatient. On Aspirin,Plavix and Lipitor which we will continue.  Patient has home health arranged.He was ambulatory on baseline,mostly nonverbal but asksfor food and water, eats well.  PT ordered and community skilled nursing facility but family wants to take him home with hospice.  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 Follow with psychiatry. Persistent  agitation. Continue Zyprexa. Also reported on Zoloftand trazodone at home.  Psychiatry evaluated him.   Hypertension: On hypertensive urgency with blood pressure noted to be in the range of 180s/100s on presentation.Started on amlodipine 10 mg daily  Hypokalemia/hypomagnesemia: Patient was reported to have incontinence of bowels but no mention of diarrhea at home. Being supplemented.  Hyperlipidemia:Continue Lipitor  Diabetes: On oral medicationsat home.   Deconditioning/debility/end-stage dementia: Plan is for initiation of hospice at home. palliative care evaluation done. Code status changed to DNR  Patient is hemodynamically stable to be discharged home with hospice .No plan for further work-up.  Discharge Diagnoses:  Principal Problem:   AMS (altered mental status) Active Problems:   HYPERTENSION, BENIGN ESSENTIAL   Acute CVA / Lt MCA 08/01/2017, Rt MCA 04/02/2017   DM (diabetes mellitus) (HCC)   Altered mental state   Dyslipidemia   Hypokalemia   Dementia with behavioral disturbance (HCC)   Goals of care, counseling/discussion   Palliative care by specialist    Discharge Instructions  Discharge Instructions    Diet - low sodium heart healthy   Complete by:  As directed    Discharge instructions   Complete by:  As directed    1) Follow up with Hospice at home. 2) Continue your home medications. 3)Follow up with your PCP and  psychiatrist as needed.   Increase activity slowly   Complete by:  As directed      Allergies as of 08/28/2018   No Known Allergies     Medication List    TAKE these medications   albuterol 108 (90 Base) MCG/ACT inhaler Commonly known as:  PROVENTIL HFA;VENTOLIN HFA Inhale 2 puffs into the lungs every 6 (six) hours as needed for wheezing or shortness of breath.   amLODipine 10 MG tablet Commonly known as:  NORVASC Take 1 tablet (10 mg total) by mouth daily. Start taking on:  08/29/2018 What changed:    medication strength  how much to take   aspirin 325 MG EC tablet Take 1 tablet (325 mg total) by mouth daily.   atorvastatin 80 MG tablet Commonly known as:  LIPITOR Take 1 tablet (80 mg total) by mouth daily at 6 PM.   clopidogrel 75 MG tablet Commonly known as:  PLAVIX Take 1 tablet (75 mg total) by mouth daily.   folic acid 1 MG tablet Commonly known as:  FOLVITE Take 1 tablet (1 mg total) by mouth daily.   glucose blood test strip Use as instructed   LINZESS 145 MCG Caps capsule Generic drug:  linaclotide Take 145 mcg by mouth daily before breakfast.   loratadine 10 MG tablet Commonly known as:  CLARITIN Take 10 mg by mouth daily.   meclizine 12.5 MG tablet Commonly known as:  ANTIVERT Take 12.5 mg by mouth 3 (three) times daily as needed for dizziness.   metFORMIN 500 MG tablet Commonly known as:  GLUCOPHAGE Take 1 tablet (500 mg total) by mouth 2 (two) times daily with a meal.   OLANZapine zydis 5 MG disintegrating tablet Commonly known as:  ZYPREXA Take 0.5 tablets (2.5 mg total) by mouth every morning. What changed:    when to take this  reasons to take this   OLANZapine zydis 5 MG disintegrating tablet Commonly known as:  ZYPREXA Take 1 tablet (5 mg total) by mouth at bedtime. What changed:  Another medication with the same name was changed. Make sure you understand how and when to take each.   onetouch ultrasoft lancets Check blood  sugars twice a day   potassium chloride SA 20 MEQ tablet Commonly known as:  K-DUR,KLOR-CON Take 2 tablets (40 mEq total) by mouth daily for 5 doses. Start taking on:  08/29/2018   sertraline 100 MG tablet Commonly known as:  ZOLOFT Take 1 tablet (100 mg total) by mouth daily.   thiamine 100 MG tablet Take 1 tablet (100 mg total) by mouth daily.   traZODone 100 MG tablet Commonly known as:  DESYREL Take 1 tablet (100 mg total) by mouth at bedtime.   Vitamin D3 1.25 MG (50000 UT) Caps Take 1 capsule by mouth once a week.      Follow-up Information    Bradley Contras, MD. Schedule an appointment as soon as possible for a visit in 1 week(s).   Specialty:  Internal Medicine Contact information: [address] [place] [address] [phone]          No Known Allergies  Consultations:  Palliative care  Procedures/Studies: Ct Head Wo Contrast  Result Date: 08/25/2018 CLINICAL DATA:  Infarct 2018 with change in mental status onset yesterday. EXAM: CT HEAD WITHOUT CONTRAST TECHNIQUE: Contiguous axial images were obtained from the base of the skull through the vertex without intravenous contrast. COMPARISON:  MRI and head CT 05/19/2018 FINDINGS: Brain: Chronic moderate small vessel ischemic disease of periventricular subcortical white matter with chronic left MCA territory infarct and encephalomalacia  redemonstrated. No acute intraparenchymal hemorrhage, midline shift or edema. Sulcal and ventricular prominence is stable consistent with atrophy. Midline fourth ventricle basal cisterns without effacement. Brainstem and cerebellum appear intact. Vascular: Atherosclerosis at the skull base. No hyperdense vessel sign. Skull: Intact Sinuses/Orbits: Mild ethmoid sinus mucosal thickening. Intact orbits and globes. Other: Trace left mastoid effusion. IMPRESSION: Stable advanced chronic small-vessel ischemic disease with cerebral atrophy. Chronic left MCA territory infarct with  encephalomalacia. No acute intraparenchymal abnormality. Electronically Signed   By: Tollie Eth M.D.   On: 08/25/2018 16:21   Mr Brain Wo Contrast  Result Date: 08/26/2018 CLINICAL DATA:  Altered level of consciousness EXAM: MRI HEAD WITHOUT CONTRAST TECHNIQUE: Multiplanar, multiecho pulse sequences of the brain and surrounding structures were obtained without intravenous contrast. COMPARISON:  05/19/2018 truncated brain MRI. Head CT from yesterday. FINDINGS: Brain: No acute infarction, acute hemorrhage, hydrocephalus, extra-axial collection or mass lesion. Remote inferior division left MCA territory infarct with cortical laminar necrosis. Patchy remote right cerebral white matter infarction aligned along the deep watershed, with wallerian changes seen into the brainstem. Chronic small vessel ischemia with confluent gliosis in the cerebral white matter. Vascular: Major flow voids are preserved Skull and upper cervical spine: No evidence of marrow lesion Sinuses/Orbits: Negative IMPRESSION: 1. No acute finding. 2. Remote left inferior division MCA territory infarct. 3. Remote patchy right cerebral white matter infarction along the deep watershed. 4. Advanced chronic small vessel ischemia. Electronically Signed   By: Marnee Spring M.D.   On: 08/26/2018 12:49   Dg Chest Port 1 View  Result Date: 08/25/2018 CLINICAL DATA:  Acute mental status change EXAM: PORTABLE CHEST 1 VIEW COMPARISON:  August 01, 2017 FINDINGS: The heart size and mediastinal contours are within normal limits. Both lungs are clear. The visualized skeletal structures are unremarkable. IMPRESSION: No active disease. Electronically Signed   By: Gerome Sam III M.D   On: 08/25/2018 15:51       Subjective: Patient seen and examined the bedside.  Remains comfortable, sitting in the chair not agitated during my evaluation.  Discharge Exam: Vitals:   08/28/18 0700 08/28/18 0744  BP: (!) 193/112 (!) 163/97  Pulse: 97 72  Resp:   18  Temp:    SpO2:  100%   Vitals:   08/27/18 2020 08/28/18 0343 08/28/18 0700 08/28/18 0744  BP: (!) 151/93 (!) 185/100 (!) 193/112 (!) 163/97  Pulse: 85 81 97 72  Resp: 19 19  18   Temp: 98.6 F (37 C) 97.7 F (36.5 C)    TempSrc: Oral Oral    SpO2: 97% 100%  100%  Weight:        General: Pt is alert, awake, not in acute distress Cardiovascular: RRR, S1/S2 +, no rubs, no gallops Respiratory: CTA bilaterally, no wheezing, no rhonchi Abdominal: Soft, NT, ND, bowel sounds + Extremities: no edema, no cyanosis    The results of significant diagnostics from this hospitalization (including imaging, microbiology, ancillary and laboratory) are listed below for reference.     Microbiology: Recent Results (from the past 240 hour(s))  Urine culture     Status: None   Collection Time: 08/25/18 11:48 PM  Result Value Ref Range Status   Specimen Description URINE, RANDOM  Final   Special Requests   Final    NONE Performed at [facility], [address], [place], [place] [address]    Culture   Final    Multiple bacterial morphotypes present, none predominant. Suggest appropriate recollection if clinically indicated.  Report Status 08/27/2018 FINAL  Final     Labs: BNP (last 3 results) No results for input(s): BNP in the last 8760 hours. Basic Metabolic Panel: Recent Labs  Lab 08/25/18 1500 08/25/18 1636 08/25/18 2341 08/26/18 0949 08/27/18 0432 08/27/18 1417 08/28/18 0722  NA 141  --  141 138 141  --  140  K 2.4*  --  3.2* 2.8* 2.4* 3.1* 3.8  CL 101  --  101 100 101  --  102  CO2 30  --  29 26 28   --  26  GLUCOSE 115*  --  95 113* 140*  --  145*  BUN 8  --  5* <5* <5*  --  <5*  CREATININE 0.96  --  0.78 0.94 0.84  --  0.92  CALCIUM 8.8*  --  8.6* 8.8* 8.8*  --  9.2  MG  --  1.8 1.6*  --   --   --   --    Liver Function Tests: Recent Labs  Lab 08/25/18 1500  AST 21  ALT 14  ALKPHOS 89  BILITOT 0.9  PROT 6.6  ALBUMIN 3.6   No results for input(s):  LIPASE, AMYLASE in the last 168 hours. Recent Labs  Lab 08/25/18 1636 08/25/18 2341  AMMONIA 27 18   CBC: Recent Labs  Lab 08/25/18 1500 08/26/18 0325  WBC 7.0 7.7  NEUTROABS  --  5.0  HGB 12.8* 13.1  HCT 40.2 40.8  MCV 87.0 85.9  PLT 209 206   Cardiac Enzymes: No results for input(s): CKTOTAL, CKMB, CKMBINDEX, TROPONINI in the last 168 hours. BNP: Invalid input(s): POCBNP CBG: Recent Labs  Lab 08/27/18 1140 08/27/18 1720 08/27/18 2150 08/28/18 0611 08/28/18 1144  GLUCAP 81 110* 100* 122* 99   D-Dimer No results for input(s): DDIMER in the last 72 hours. Hgb A1c No results for input(s): HGBA1C in the last 72 hours. Lipid Profile No results for input(s): CHOL, HDL, LDLCALC, TRIG, CHOLHDL, LDLDIRECT in the last 72 hours. Thyroid function studies Recent Labs    08/25/18 2341  TSH 4.981*   Anemia work up Recent Labs    08/25/18 1645 08/25/18 2341  VITAMINB12 569 572   Urinalysis    Component Value Date/Time   COLORURINE YELLOW 08/25/2018 2348   APPEARANCEUR CLEAR 08/25/2018 2348   LABSPEC 1.009 08/25/2018 2348   PHURINE 7.0 08/25/2018 2348   GLUCOSEU NEGATIVE 08/25/2018 2348   HGBUR NEGATIVE 08/25/2018 2348   HGBUR negative 08/28/2010 0959   BILIRUBINUR NEGATIVE 08/25/2018 2348   KETONESUR NEGATIVE 08/25/2018 2348   PROTEINUR NEGATIVE 08/25/2018 2348   UROBILINOGEN 1.0 02/20/2012 1944   NITRITE NEGATIVE 08/25/2018 2348   LEUKOCYTESUR NEGATIVE 08/25/2018 2348   Sepsis Labs Invalid input(s): PROCALCITONIN,  WBC,  LACTICIDVEN Microbiology Recent Results (from the past 240 hour(s))  Urine culture     Status: None   Collection Time: 08/25/18 11:48 PM  Result Value Ref Range Status   Specimen Description URINE, RANDOM  Final   Special Requests   Final    NONE Performed at [facility], [address] [place], [place] [address]    Culture   Final    Multiple bacterial morphotypes present, none predominant. Suggest appropriate recollection  if clinically indicated.   Report Status 08/27/2018 FINAL  Final    Please note: You were cared for by a hospitalist during your hospital stay. Once you are discharged, your primary care physician will handle any further medical issues. Please note that NO  REFILLS for any discharge medications will be authorized once you are discharged, as it is imperative that you return to your primary care physician (or establish a relationship with a primary care physician if you do not have one) for your post hospital discharge needs so that they can reassess your need for medications and monitor your lab values.    Time coordinating discharge: 40 minutes  SIGNED:   Burnadette Pop, MD  [facility] 08/28/2018, 1:38 PM Pager [phone]  If 7PM-7AM, please contact night-coverage [email or web address] Password [email or web address]

## 2018-08-28 NOTE — Progress Notes (Signed)
Daily Progress Note   Patient Name: [name]       Date: 08/28/2018 DOB: 07/21/1941  Age: 77 y.o. MRN#: [id number] Attending Physician: Burnadette PopAdhikari, Amrit, MD Primary Care Physician: Fleet ContrasAvbuere, [name], MD Admit Date: 08/25/2018  Reason for Consultation/Follow-up: Establishing goals of care and Hospice Evaluation  Subjective: Patient does not speak, but seems calm today. Daughter at bedside.   Length of Stay: 3  Current Medications: Scheduled Meds:  . amLODipine  2.5 mg Oral Daily  . aspirin  325 mg Oral Daily  . atorvastatin  80 mg Oral q1800  . clopidogrel  75 mg Oral Daily  . folic acid  1 mg Oral Daily  . insulin aspart  0-5 Units Subcutaneous QHS  . insulin aspart  0-9 Units Subcutaneous TID WC  . linaclotide  145 mcg Oral QAC breakfast  . OLANZapine  2.5 mg Oral q morning - 10a  . OLANZapine zydis  5 mg Oral QHS  . potassium chloride  40 mEq Oral Once  . sertraline  100 mg Oral Daily  . thiamine  100 mg Oral Daily  . traZODone  100 mg Oral QHS  . [START ON 09/01/2018] Vitamin D (Ergocalciferol)  50,000 Units Oral Q Mon    Continuous Infusions: . dextrose 5 % and 0.9% NaCl Stopped (08/28/18 0310)    PRN Meds: albuterol, haloperidol lactate, labetalol  Physical Exam         Constitutional: He appears cachectic. No distress.  HENT:  Head: Normocephalic and atraumatic.  Cardiovascular: Normal rate and regular rhythm.  Pulmonary/Chest: Effort normal and breath sounds normal.  Abdominal: Soft.  Musculoskeletal:       Right lower leg: He exhibits no edema.       Left lower leg: He exhibits no edema.  Neurological: He is alert.  nonverbal  Skin: Skin is warm and dry. He is not diaphoretic.  Psychiatric: Cognition and memory are impaired.   Vital Signs: BP (!) 163/97 (BP Location:  Left Arm)   Pulse 72   Temp 97.7 F (36.5 C) (Oral)   Resp 18   Wt 55.8 kg   SpO2 100%   BMI 21.79 kg/m  SpO2: SpO2: 100 % O2 Device: O2 Device: Room Air O2 Flow Rate:    Intake/output summary:   Intake/Output Summary (Last 24 hours) at 08/28/2018 0810 Last data filed at 08/28/2018 0400 Gross per 24 hour  Intake 1050.94 ml  Output -  Net 1050.94 ml   LBM: Last BM Date: 08/25/18 Baseline Weight: Weight: 55.8 kg Most recent weight: Weight: 55.8 kg       Palliative Assessment/Data: PPS 20% d/t PO intake    Flowsheet Rows     Most Recent Value  Intake Tab  Referral Department  Hospitalist  Unit at Time of Referral  Cardiac/Telemetry Unit  Palliative Care Primary Diagnosis  Neurology  Date Notified  08/27/18  Palliative Care Type  New Palliative care  Reason for referral  Clarify Goals of Care  Date of Admission  08/25/18  Date first seen by Palliative Care  08/27/18  # of days Palliative referral response time  0 Day(s)  # of days IP prior to Palliative referral  2  Clinical  Assessment  Palliative Performance Scale Score  20%  Psychosocial & Spiritual Assessment  Palliative Care Outcomes  Patient/Family meeting held?  Yes  Who was at the meeting?  daughter  Palliative Care Outcomes  Counseled regarding hospice, Clarified goals of care, Transitioned to hospice, Provided psychosocial or spiritual support      Patient Active Problem List   Diagnosis Date Noted  . Goals of care, counseling/discussion   . Palliative care by specialist   . Hypokalemia 08/25/2018  . Dementia with behavioral disturbance (HCC) 08/25/2018  . AMS (altered mental status) 08/25/2018  . Aphasia 05/19/2018  . Aphasia as late effect of stroke 12/23/2017  . Cytotoxic cerebral edema (HCC)   . History of stroke   . Agitation   . Blurred vision, bilateral 06/24/2017  . Dyslipidemia 04/10/2017  . 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 Acute ischemic left MCA stroke (HCC) 04/05/2017  . Left  hemiparesis (HCC)   . Oropharyngeal dysphagia   . Confusion   . Altered mental state   . Acute CVA / Lt MCA 08/01/2017, Rt MCA 04/02/2017 04/02/2017  . AKI (acute kidney injury) (HCC) 04/02/2017  . DM (diabetes mellitus) (HCC) 04/02/2017  . Elevated transaminase level 04/02/2017  . 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 Diabetic complication (HCC) 03/26/2017  . Dizziness 03/26/2017  . Postop check 03/11/2012  . HYDROCELE, RIGHT 08/28/2010  . NOCTURIA 08/28/2010  . OTH&UNSPEC NONINFECTIOUS GASTROENTERITIS&COLITIS 06/29/2010  . ABDOMINAL PAIN-RLQ 06/29/2010  . ABNORMAL FINDINGS GI TRACT 06/29/2010  . HYPERTENSION, BENIGN ESSENTIAL 05/02/2010  . LEUKOCYTOSIS 04/20/2010  . COLITIS, HX OF 04/20/2010    Palliative Care Assessment & Plan   HPI: 77 y.o. 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 Patient admitted at the end of August with same symptoms. At baseline. PMT consulted for GOC.    Assessment: Follow up with daughter today - long conversation yesterday regarding goals of care. Daughter has spoken to her sisters about yesterday's conversation and they have all agreed to DNR status and home with hospice support.   Recommendations/Plan: - patient with progression of dementia - poor PO intake, dependent in ADLs, non-verbal  - daughter interested in hospice support at home - order placed for case management - daughter discussed with other family members who have agreed to DNR status - symptoms managed by psych - will defer   Code Status:  DNR  Prognosis:   < 6 months  Discharge Planning:  Home with Hospice  Care plan was discussed with patient's daughter and RN  Thank you for allowing the Palliative Medicine Team to assist in the care of this patient.   Total Time 25 minutes Prolonged Time Billed  no         Greater than 50%  of this time was spent counseling and coordinating care related to the above assessment and plan.  Gerlean Ren, DNP, [facility] Palliative Medicine Team Team Phone # [phone]  Pager [phone]

## 2018-08-28 NOTE — Progress Notes (Signed)
Initial Nutrition Assessment  DOCUMENTATION CODES:   Not applicable  INTERVENTION:  Encourage adequate PO intake.   Recommend nutritional supplements post discharge to aid in caloric and protein needs especially if po intake becomes inadequate.   NUTRITION DIAGNOSIS:   Inadequate oral intake related to (altered mental status) as evidenced by per patient/family report.  GOAL:   Patient will meet greater than or equal to 90% of their needs  MONITOR:   PO intake, Labs, Weight trends, I & O's, Skin  REASON FOR ASSESSMENT:   Consult Assessment of nutrition requirement/status  ASSESSMENT:   77 y.o. male with medical history significant of 2 strokes (July and November 2018),  left residual hemiparesis, DM type 2 , HTN, hyperlipidemia who was brought from home by his daughter with complaints of altered mental status.  Pt nonverbal. Family at bedside feeding pt lunch during time of visit and encouraging PO intake. Family reports pt has been eating well with usual consumption of at least 3 meals a day (chicken soup, eggs, vegetables) up until Sunday, 12/1 when PO intake decreased. Intake at meals has been now increasing. Pt with no significant weight loss per weight records. Plans for home with hospice with discharge today. Recommend providing nutritional supplements at home especially if po intake decreases.  Unable to complete Nutrition-Focused physical exam at this time.   Labs and medications reviewed.   Diet Order:   Diet Order            Diet - low sodium heart healthy        Diet Heart Room service appropriate? Yes; Fluid consistency: Thin  Diet effective now              EDUCATION NEEDS:   Not appropriate for education at this time  Skin:  Skin Assessment: Reviewed RN Assessment  Last BM:  12/2  Height:   Ht Readings from Last 1 Encounters:  06/24/18 5\' 3"  (1.6 m)    Weight:   Wt Readings from Last 1 Encounters:  08/26/18 55.8 kg    Ideal Body  Weight:  56.36 kg  BMI:  Body mass index is 21.79 kg/m.  Estimated Nutritional Needs:   Kcal:  1500-1700  Protein:  70-85 grams  Fluid:  >/= 1.5 L/day    Roslyn SmilingStephanie Arren Laminack, MS, RD, LDN Pager # [phone] After hours/ weekend pager # [phone]

## 2018-08-29 NOTE — Progress Notes (Signed)
[name] ng d/c  home with hospice home care via [facility].. . BP at d/c  was 159/85 hr 67  condition stable ,Pt's daughter [name] notified  pt en route  to the house.

## 2018-08-29 NOTE — Progress Notes (Signed)
Received report ,pt d/c home and awaiting [facility] for pick up no Iv. pick up.  Pt dressed up  Sitting in a chair non interactive  Sitter at bed side.  Safety maintained.

## 2018-08-29 NOTE — Progress Notes (Signed)
Pt  Confused refused to take po medication Daughter called at home to update, pt still waiting for transport and if she would  talk to pt to take pm medication.. Pt took part of the medication and spit the rest out. BP high 183/98 hr 75  and agitated. Pt has no IV. MD on call tx-paged for high bp. No order given. BP  rechecked and was 178/95.. RN attempted to insert IV and give labetalol/ haldol IV for high BP and agitation as pt refusing po, but un-  successful as . MD re paged, no response..Marland Kitchen

## 2020-01-28 NOTE — Telephone Encounter (Signed)
Opened in error

## 2020-07-04 IMAGING — CT CT HEAD W/O CM
1 of 2 series · 15 of 30 positions shown, 19 images · non-contrast
Comparison: 08/03/2017

CLINICAL DATA: Previous stroke.  Mental status changes.

EXAM:
CT HEAD WITHOUT CONTRAST
TECHNIQUE: Contiguous axial images were obtained from the base of the skull
through the vertex without intravenous contrast.

[Series 3: head 5.0 h30s · axial · 0.49mm/px · z∈[-79,+71]mm · 15 of 34 slices shown, 19 images]
[im 2/34  brain]
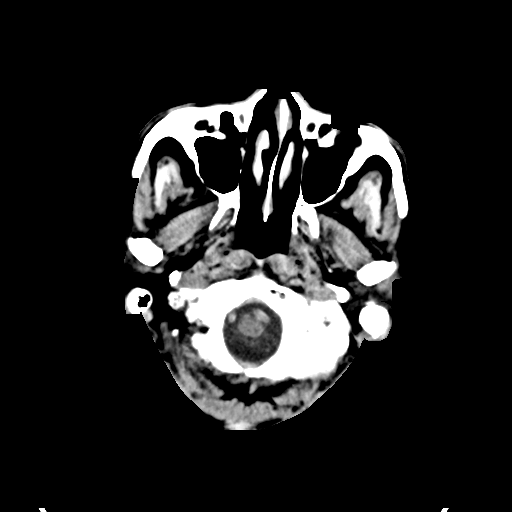
[im 2/34  bone]
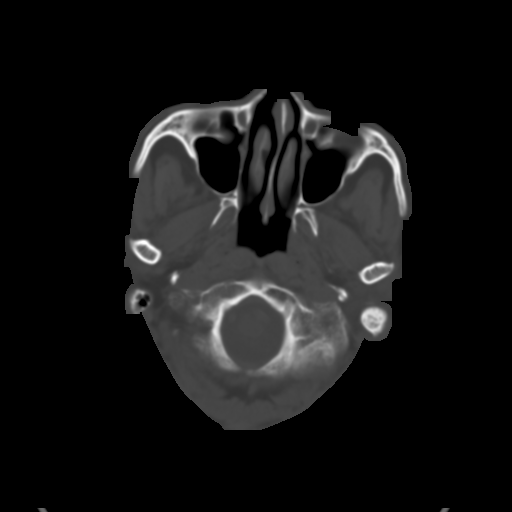
[im 5/34  brain]
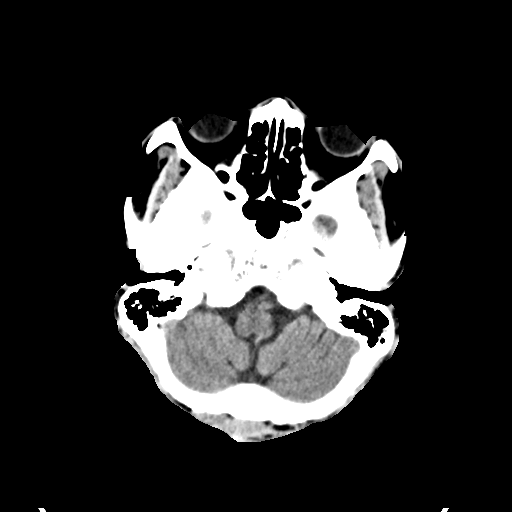
[im 7/34  brain]
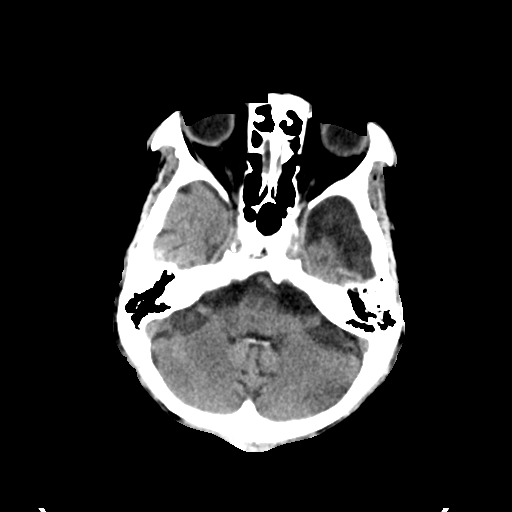
[im 8/34  brain]
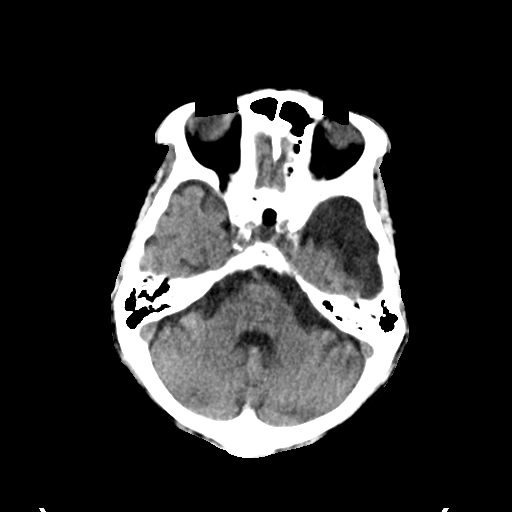
[im 12/34  brain]
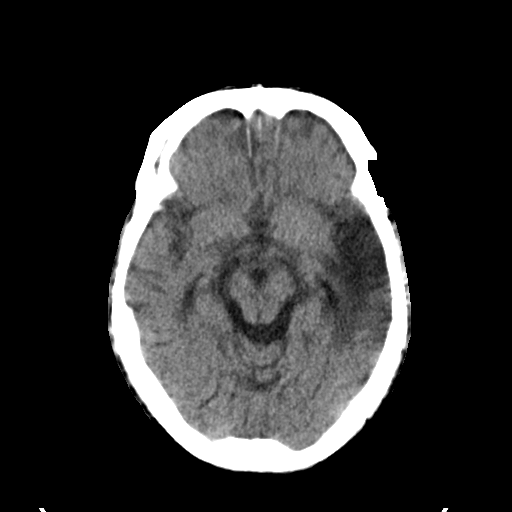
[im 12/34  bone]
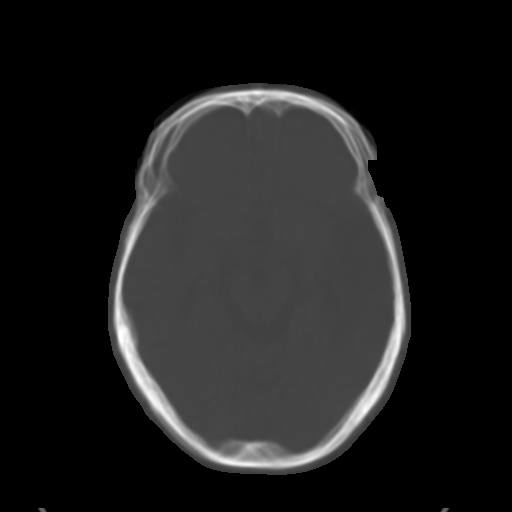
[im 13/34  brain]
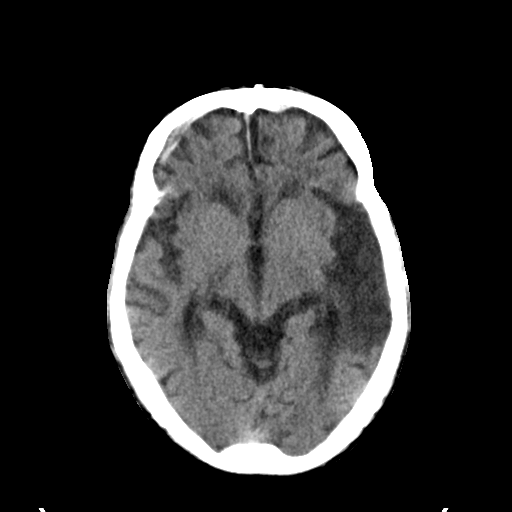
[im 15/34  brain]
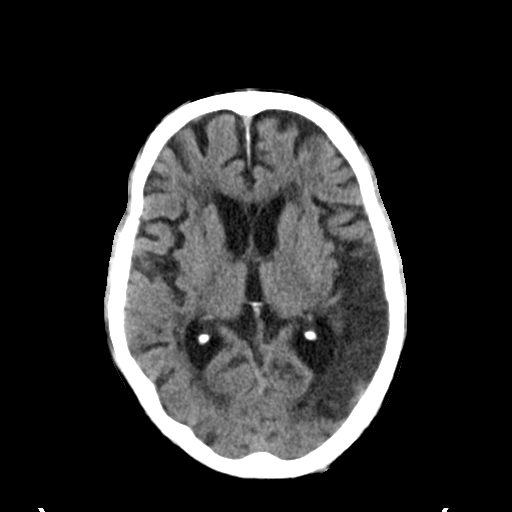
[im 18/34  brain]
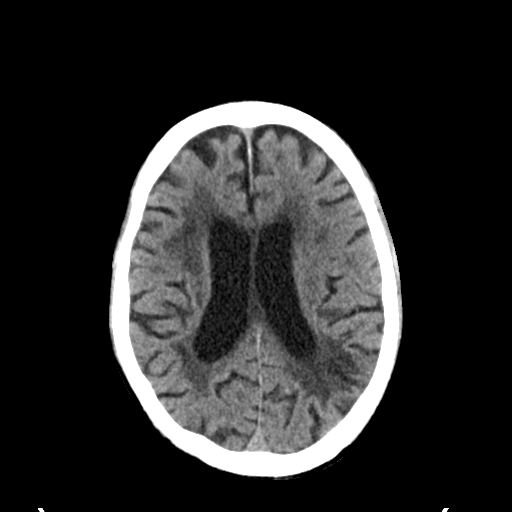
[im 19/34  brain]
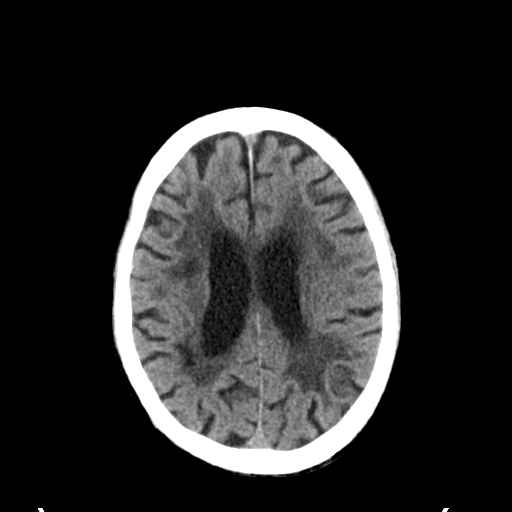
[im 19/34  bone]
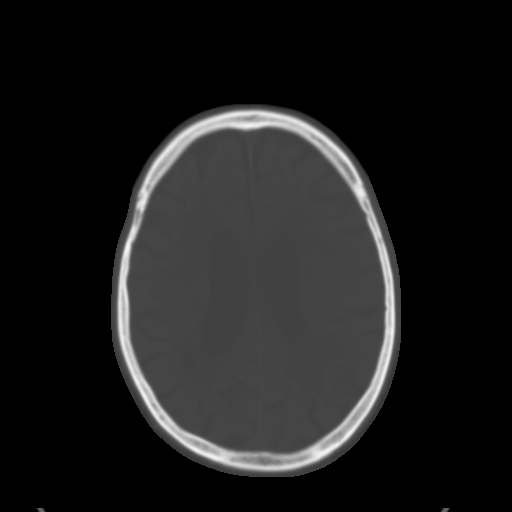
[im 21/34  brain]
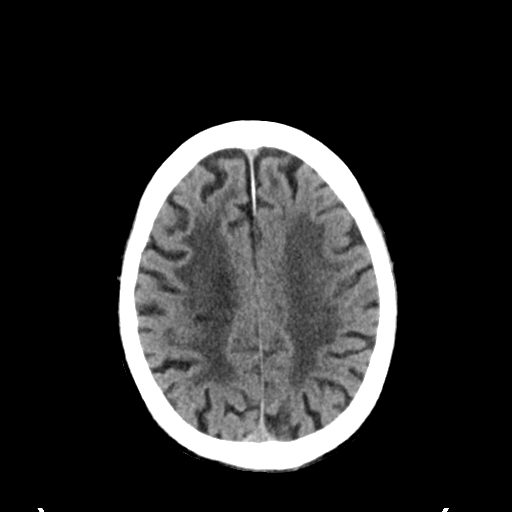
[im 24/34  brain]
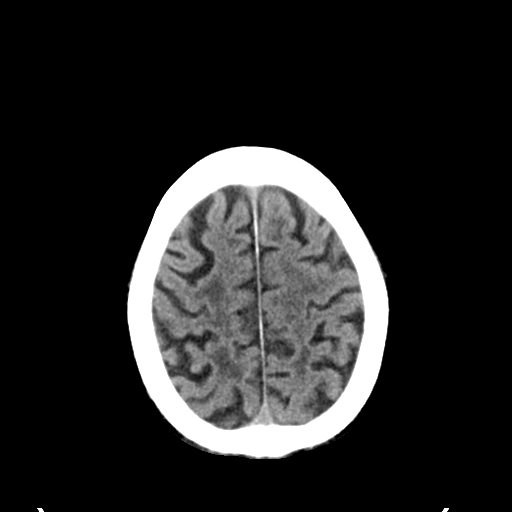
[im 26/34  brain]
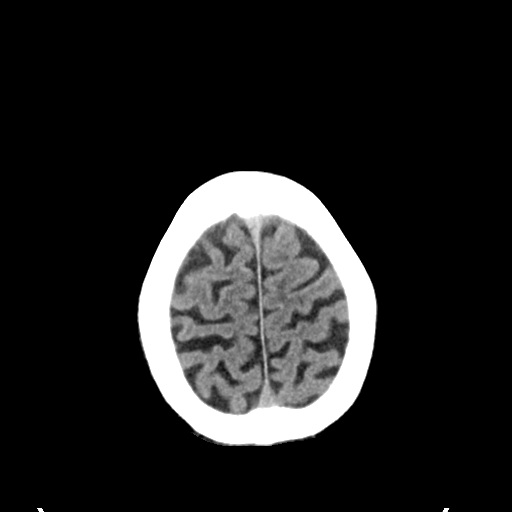
[im 27/34  brain]
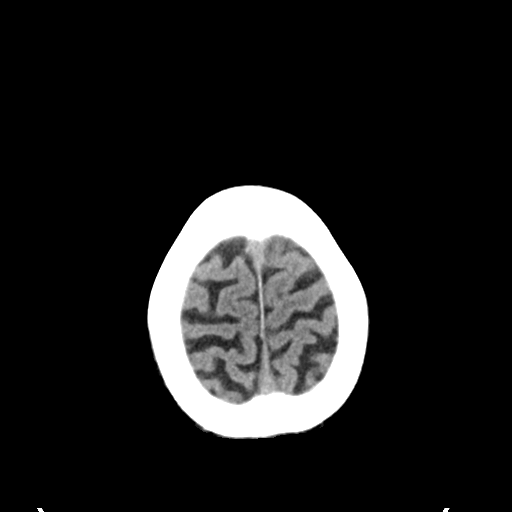
[im 27/34  bone]
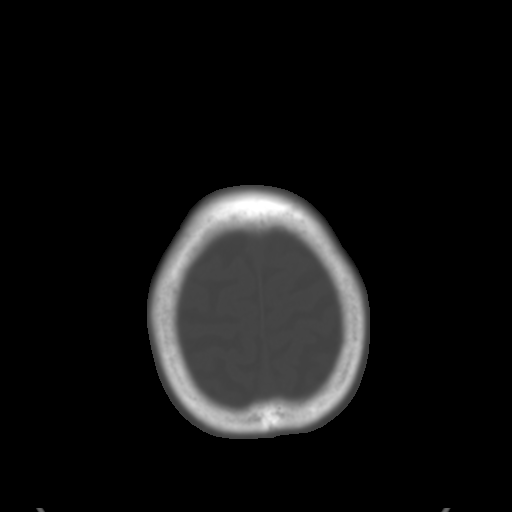
[im 30/34  brain]
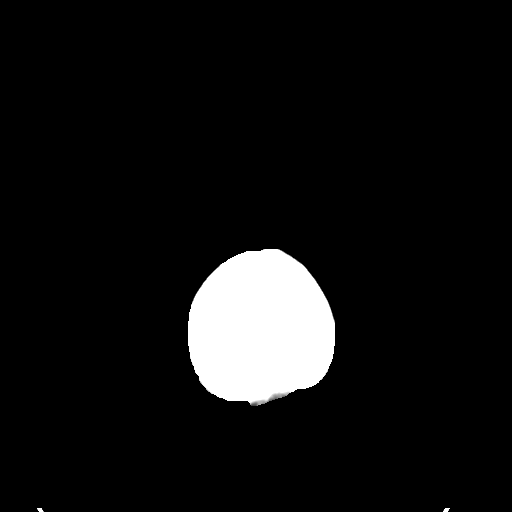
[im 32/34  brain]
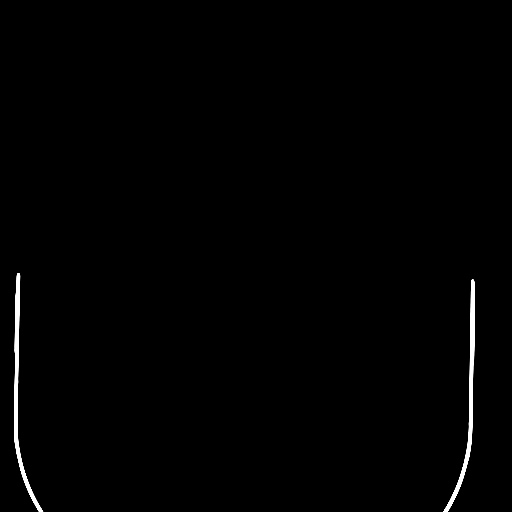

[15 of 30 positions shown; findings below may reference images not displayed]

FINDINGS: Brain: Generalized atrophy. Extensive chronic small-vessel ischemic
changes throughout the cerebral hemispheric white matter. Old left
MCA territory stroke affecting the temporal lobe, insula and
frontoparietal brain. This has progressed to atrophy and
encephalomalacia. No CT evidence of acute infarction, mass lesion,
hemorrhage, hydrocephalus or extra-axial collection.

Vascular: There is atherosclerotic calcification of the major
vessels at the base of the brain.

Skull: Negative

Sinuses/Orbits: Clear/normal

Other: None
IMPRESSION: Old left MCA territory infarction with atrophy and encephalomalacia.
Advanced chronic small-vessel ischemic change throughout the
hemispheric white matter. No sign of acute or subacute lesion.

These results were communicated to Dr. Eccel At [DATE] [place]
05/19/2018by text page via the [employer] messaging system.
